# Patient Record
Sex: Female | Born: 1940
Health system: Southern US, Community
[De-identification: ages and names within clinical notes are randomized; demographics above are authoritative.]

## PROBLEM LIST (undated history)

## (undated) DIAGNOSIS — K219 Gastro-esophageal reflux disease without esophagitis: Secondary | ICD-10-CM

## (undated) DIAGNOSIS — IMO0001 Reserved for inherently not codable concepts without codable children: Secondary | ICD-10-CM

## (undated) DIAGNOSIS — T7840XA Allergy, unspecified, initial encounter: Secondary | ICD-10-CM

## (undated) DIAGNOSIS — E0500 Thyrotoxicosis with diffuse goiter without thyrotoxic crisis or storm: Secondary | ICD-10-CM

## (undated) DIAGNOSIS — C801 Malignant (primary) neoplasm, unspecified: Secondary | ICD-10-CM

## (undated) DIAGNOSIS — M1990 Unspecified osteoarthritis, unspecified site: Secondary | ICD-10-CM

## (undated) HISTORY — DX: Allergy, unspecified, initial encounter: T78.40XA

## (undated) HISTORY — PX: ABDOMINAL HYSTERECTOMY: SHX81

## (undated) HISTORY — DX: Malignant (primary) neoplasm, unspecified: C80.1

## (undated) HISTORY — PX: SALIVARY GLAND SURGERY: SHX768

## (undated) HISTORY — DX: Unspecified osteoarthritis, unspecified site: M19.90

---

## 1998-05-10 ENCOUNTER — Other Ambulatory Visit: Admission: RE | Admit: 1998-05-10 | Discharge: 1998-05-10 | Payer: Self-pay | Admitting: Obstetrics & Gynecology

## 1999-04-22 ENCOUNTER — Ambulatory Visit (HOSPITAL_COMMUNITY): Admission: RE | Admit: 1999-04-22 | Discharge: 1999-04-22 | Payer: Self-pay | Admitting: Internal Medicine

## 1999-04-22 ENCOUNTER — Encounter: Payer: Self-pay | Admitting: Internal Medicine

## 1999-05-06 ENCOUNTER — Encounter: Payer: Self-pay | Admitting: Internal Medicine

## 1999-05-06 ENCOUNTER — Ambulatory Visit (HOSPITAL_COMMUNITY): Admission: RE | Admit: 1999-05-06 | Discharge: 1999-05-06 | Payer: Self-pay | Admitting: Internal Medicine

## 1999-05-21 ENCOUNTER — Other Ambulatory Visit: Admission: RE | Admit: 1999-05-21 | Discharge: 1999-05-21 | Payer: Self-pay | Admitting: Obstetrics & Gynecology

## 1999-08-06 ENCOUNTER — Ambulatory Visit (HOSPITAL_COMMUNITY): Admission: RE | Admit: 1999-08-06 | Discharge: 1999-08-06 | Payer: Self-pay | Admitting: *Deleted

## 2000-06-18 ENCOUNTER — Emergency Department (HOSPITAL_COMMUNITY): Admission: EM | Admit: 2000-06-18 | Discharge: 2000-06-18 | Payer: Self-pay | Admitting: Emergency Medicine

## 2000-08-14 ENCOUNTER — Other Ambulatory Visit: Admission: RE | Admit: 2000-08-14 | Discharge: 2000-08-14 | Payer: Self-pay | Admitting: Obstetrics & Gynecology

## 2001-07-21 ENCOUNTER — Encounter: Payer: Self-pay | Admitting: Family Medicine

## 2001-07-21 ENCOUNTER — Ambulatory Visit (HOSPITAL_COMMUNITY): Admission: RE | Admit: 2001-07-21 | Discharge: 2001-07-21 | Payer: Self-pay | Admitting: Family Medicine

## 2001-07-29 ENCOUNTER — Ambulatory Visit (HOSPITAL_COMMUNITY): Admission: RE | Admit: 2001-07-29 | Discharge: 2001-07-29 | Payer: Self-pay | Admitting: Family Medicine

## 2001-07-29 ENCOUNTER — Encounter: Payer: Self-pay | Admitting: Family Medicine

## 2001-09-01 ENCOUNTER — Other Ambulatory Visit: Admission: RE | Admit: 2001-09-01 | Discharge: 2001-09-01 | Payer: Self-pay | Admitting: Obstetrics & Gynecology

## 2002-04-01 ENCOUNTER — Encounter (INDEPENDENT_AMBULATORY_CARE_PROVIDER_SITE_OTHER): Payer: Self-pay | Admitting: *Deleted

## 2002-04-01 ENCOUNTER — Ambulatory Visit (HOSPITAL_COMMUNITY): Admission: RE | Admit: 2002-04-01 | Discharge: 2002-04-01 | Payer: Self-pay | Admitting: Obstetrics & Gynecology

## 2002-10-03 ENCOUNTER — Other Ambulatory Visit: Admission: RE | Admit: 2002-10-03 | Discharge: 2002-10-03 | Payer: Self-pay | Admitting: Obstetrics and Gynecology

## 2004-01-23 ENCOUNTER — Other Ambulatory Visit: Admission: RE | Admit: 2004-01-23 | Discharge: 2004-01-23 | Payer: Self-pay | Admitting: Obstetrics & Gynecology

## 2004-03-18 ENCOUNTER — Ambulatory Visit (HOSPITAL_COMMUNITY): Admission: RE | Admit: 2004-03-18 | Discharge: 2004-03-18 | Payer: Self-pay | Admitting: *Deleted

## 2004-06-20 ENCOUNTER — Ambulatory Visit (HOSPITAL_COMMUNITY): Admission: RE | Admit: 2004-06-20 | Discharge: 2004-06-20 | Payer: Self-pay | Admitting: *Deleted

## 2004-06-20 ENCOUNTER — Encounter (INDEPENDENT_AMBULATORY_CARE_PROVIDER_SITE_OTHER): Payer: Self-pay | Admitting: Specialist

## 2004-10-06 ENCOUNTER — Emergency Department (HOSPITAL_COMMUNITY): Admission: EM | Admit: 2004-10-06 | Discharge: 2004-10-06 | Payer: Self-pay | Admitting: Emergency Medicine

## 2005-07-30 ENCOUNTER — Other Ambulatory Visit: Admission: RE | Admit: 2005-07-30 | Discharge: 2005-07-30 | Payer: Self-pay | Admitting: Obstetrics & Gynecology

## 2005-08-18 ENCOUNTER — Ambulatory Visit (HOSPITAL_COMMUNITY): Admission: RE | Admit: 2005-08-18 | Discharge: 2005-08-18 | Payer: Self-pay | Admitting: *Deleted

## 2005-08-18 ENCOUNTER — Encounter (INDEPENDENT_AMBULATORY_CARE_PROVIDER_SITE_OTHER): Payer: Self-pay | Admitting: *Deleted

## 2006-03-06 ENCOUNTER — Ambulatory Visit (HOSPITAL_COMMUNITY): Admission: RE | Admit: 2006-03-06 | Discharge: 2006-03-06 | Payer: Self-pay | Admitting: Endocrinology

## 2006-03-06 ENCOUNTER — Encounter: Payer: Self-pay | Admitting: Vascular Surgery

## 2007-09-17 ENCOUNTER — Encounter (INDEPENDENT_AMBULATORY_CARE_PROVIDER_SITE_OTHER): Payer: Self-pay | Admitting: *Deleted

## 2007-09-17 ENCOUNTER — Ambulatory Visit (HOSPITAL_COMMUNITY): Admission: RE | Admit: 2007-09-17 | Discharge: 2007-09-17 | Payer: Self-pay | Admitting: *Deleted

## 2007-11-16 ENCOUNTER — Encounter (INDEPENDENT_AMBULATORY_CARE_PROVIDER_SITE_OTHER): Payer: Self-pay | Admitting: Obstetrics & Gynecology

## 2007-11-16 ENCOUNTER — Ambulatory Visit (HOSPITAL_COMMUNITY): Admission: RE | Admit: 2007-11-16 | Discharge: 2007-11-17 | Payer: Self-pay | Admitting: Obstetrics & Gynecology

## 2010-05-29 ENCOUNTER — Encounter: Admission: RE | Admit: 2010-05-29 | Discharge: 2010-05-29 | Payer: Self-pay | Admitting: Endocrinology

## 2010-09-24 ENCOUNTER — Emergency Department (HOSPITAL_COMMUNITY): Admission: EM | Admit: 2010-09-24 | Discharge: 2010-09-24 | Payer: Self-pay | Admitting: Emergency Medicine

## 2011-05-06 NOTE — Discharge Summary (Signed)
NAMEARIANI, [name]               ACCOUNT NO.:  [id number]   MEDICAL RECORD NO.:  [id number]          PATIENT TYPE:  OIB   LOCATION:  9303                          FACILITY:  [facility]   PHYSICIAN:  Freddy Finner, M.D.   DATE OF BIRTH:  [birth year]   DATE OF ADMISSION:  11/16/2007  DATE OF DISCHARGE:  11/17/2007                               DISCHARGE SUMMARY   DISCHARGE DIAGNOSES:  1. Uterine fibroids.  2. Dysfunctional uterine bleeding uncontrolled by hormonal therapy.   OPERATIVE PROCEDURE:  Laparoscopically-assisted vaginal hysterectomy,  bilateral salpingo-oophorectomy.   COMPLICATIONS:  None.   DISPOSITION:  The patient is in satisfactory and improved condition at  the time of her discharge on the first postoperative morning.  She was  ambulating without difficulty, having adequate bowel and bladder  function, tolerating a regular diet.  She is to have progressively  increasing physical activity and no vaginal entry.  She is to return to  the office in 2 weeks for first postoperative visit.  She is to resume  all of her preoperative medications, and she is given Darvocet-N 100 to  be taken 1 p.o. q.4-6 h. __________   Details of present illness, past history, family history, review of  systems, and physical exam are recorded on admission note.  Physical  findings significant for the pelvic exam with ultrasound showing  fibroids.  The patient has continued to have dysfunctional bleeding  unresponsive to hormone manipulation.  She has had previous D&Cs with  incomplete resolution.  She was admitted for definitive surgery.   Laboratory data during this admission includes a normal prothrombin  time, PTT and INR.  Her admission hemoglobin was 13.3.  Postoperative  hemoglobin __________   HOSPITAL COURSE:  The patient was admitted for surgery.  She was treated  perioperatively with IV antibiotic and PAS compression hose.  Her  procedure was accomplished without significant blood  loss or  intraoperative complications.  By the morning of the first postoperative  day, she was considered to be in good condition.  Her disposition is  noted above.      Freddy Finner, M.D.  Electronically Signed     WRN/MEDQ  D:  11/17/2007  T:  11/17/2007  Job:  [id number]

## 2011-05-06 NOTE — Op Note (Signed)
[name], [name]               ACCOUNT NO.:  [id number]   MEDICAL RECORD NO.:  [id number]          PATIENT TYPE:  OIB   LOCATION:  9303                          FACILITY:  [facility]   PHYSICIAN:  Freddy Finner, M.D.   DATE OF BIRTH:  October 18, 1941   DATE OF PROCEDURE:  11/16/2007  DATE OF DISCHARGE:                               OPERATIVE REPORT   PREOPERATIVE DIAGNOSES:  1. Uterine leiomyomata.  2. Dysfunctional uterine bleeding, uncontrolled by hormonal therapy.   POSTOPERATIVE DIAGNOSES:  1. Uterine leiomyomata.  2. Dysfunctional uterine bleeding, uncontrolled by hormonal therapy.   OPERATIVE PROCEDURE:  Laparoscopically-assisted vaginal hysterectomy  bilateral salpingo-oophorectomy.   SURGEON:  Freddy Finner, M.D.   ASSISTANT:  Guy Sandifer. Henderson Cloud, M.D.   ESTIMATED INTRAOPERATIVE BLOOD LOSS:  100 mL.   INTRAOPERATIVE COMPLICATIONS:  None.   The patient is a 70 year old with clinical symptoms of persistent  dysfunctional bleeding which we have not been adequately able to control  with hormonal manipulation.  She has had previous D&C's and is  requesting definitive surgery.  She is known to have fibroids by  ultrasound.   She was admitted on the morning of surgery, given a bolus of Ancef,  placed in PAS hose, brought to the operating room and placed there  placed under adequate general endotracheal anesthesia, then placed in  the dorsal lithotomy position using the Allen stirrup system.  Betadine  prep of the abdomen, perineum and vagina was carried out with Betadine  scrub, followed by Betadine solution.  Bladder was evacuated using  sterile technique.  Hulka tenaculum was attached to the cervix under  direct visualization.  Sterile drapes were applied.  Two small incisions  were made through old scars, and through the upper incision at the  umbilicus an 11-mm bladed disposable trocar was introduced while  elevating the anterior abdominal wall manually.  Direct inspection  revealed adequate placement and no evidence of injury on entry.  Pneumoperitoneum was allowed to accumulate with the carbon dioxide gas.  A 5-mm trocar was placed through her lower incision just above the  symphysis.  Through it the spring-loaded grasping forceps were used.  Systematic examination of abdominal and pelvic contents was carried out.  The uterus was slightly enlarged.  There were no apparent abnormalities  in the abdomen.  The appendix was not visualized.  Tubes and ovaries  were appropriate for postmenopausal state and normal.  The spring-loaded  graspers were used to grasp the left adnexa.  The Gyrus tripolar device  was used through the operating channel of the laparoscope and the  infundibulopelvic ligament, round ligament, upper broad ligament were  progressively sealed and divided using the tripolar device.  The  dissection was carried down to a level just above the uterine artery.  The right side was treated essentially identically.  Hemostasis was  adequate.  Attention was turned the vaginal portion of the procedure.  A  posterior weighted vaginal retractor was placed, a Jacobs tenaculum was  used to replace the Hulka.  A colpotomy incision was made while tenting  the mucosa posterior to the cervix.  The cervix was circumscribed with a  scalpel to release the mucosa and the bladder advanced off the cervix.  The LigaSure device was then used to seal and divide the uterosacral  ligaments and bladder pillars.  The bladder was further advanced off the  cervix and anterior peritoneum entered.  Cardinal ligament pedicles were  taken, sealed and divided by the LigaSure.  Vessel pedicles were  similarly sealed and divided with the LigaSure.  A pedicle just above  the vessels was also obtained, sealed and divided.  This allowed  delivery of the uterus and tubes and ovaries through the vaginal  introitus.  Angles of the vagina were anchored to the uterosacrals with  mattress  sutures of 0 Monocryl.  Uterosacrals were plicated and  posterior peritoneum closed with interrupted 0 Monocryl.  Cuff was  closed vertically with figure-of-eights of Monocryl.  A Foley catheter  was placed.  Reinspection was then carried out laparoscopically using  the Nezhat irrigation system.  All pedicles were completely dry under  reduced intra-abdominal pressure and irrigation.  Irrigating solution  was aspirated from the abdomen.  Gas was allowed to escape.  Instruments  were removed.  Counts were correct.  The incisions were anesthetized  with 0.25% plain Marcaine and closed with interrupted subcuticular  sutures of 3-0 Dexon, and Steri-Strips were applied to the lower  incision.  The patient was awakened and taken to the recovery room in  good condition.      Freddy Finner, M.D.  Electronically Signed     WRN/MEDQ  D:  11/16/2007  T:  11/16/2007  Job:  [id number]

## 2011-05-06 NOTE — Op Note (Signed)
NAMENIMA, BAMBURG               ACCOUNT NO.:  [id number]   MEDICAL RECORD NO.:  [id number]          PATIENT TYPE:  AMB   LOCATION:  ENDO                         FACILITY:  [facility]   PHYSICIAN:  Georgiana Spinner, M.D.    DATE OF BIRTH:  1941-07-06   DATE OF PROCEDURE:  DATE OF DISCHARGE:                               OPERATIVE REPORT   PROCEDURE:  Upper endoscopy.   INDICATIONS:  GERD.   ANESTHESIA:  Fentanyl 75 mcg, Versed 7 mg.   DESCRIPTION OF PROCEDURE:  With the patient mildly sedated in the left  lateral decubitus position, the Pentax videoscopic endoscope was  inserted in the mouth, passed under direct vision through the esophagus  which appeared normal until we reached the distal esophagus and there  was a question of a tongue of Barrett's esophagus photographed and  biopsied.  We entered into the stomach. The fundus, body, antrum,  duodenal bulb, and second portion of duodenum appeared normal.  From  this point, the endoscope was slowly withdrawn taking circumferential  views of the duodenal mucosa until the endoscope had been pulled back  into the stomach, placed in retroflexion to view the stomach from below.  The endoscope was then straightened and withdrawn taking circumferential  views of the remaining gastric and esophageal mucosa.  The patient's  vital signs and pulse oximeter remained stable.  The patient tolerated  the procedure well without apparent complications.   FINDINGS:  Question of tongue of Barrett's esophagus, but on retroflexed  view it appears that this was probably normal and not related to  Barrett's, but will await biopsy report.  The patient will call me for  results and follow-up with me as an outpatient.           ______________________________  Georgiana Spinner, M.D.     GMO/MEDQ  D:  09/17/2007  T:  09/17/2007  Job:  [id number]

## 2011-05-06 NOTE — H&P (Signed)
[name], [name]               ACCOUNT NO.:  [id number]   MEDICAL RECORD NO.:  [id number]          PATIENT TYPE:  AMB   LOCATION:  SDC                           FACILITY:  [facility]   PHYSICIAN:  Freddy Finner, M.D.   DATE OF BIRTH:  05/10/1941   DATE OF ADMISSION:  11/16/2007  DATE OF DISCHARGE:                              HISTORY & PHYSICAL   ADMITTING DIAGNOSES:  1. Persistent dysfunctional uterine bleeding.  2. Small intramural leiomyomata.  3. Recent benign endometrium on hysteroscopy D&C in April 2008.   The patient is a 70 year old, white, married female, gravida 2, para 2  who has been on hormone replacement for approximately 17 years.  Various  preparations have been used over time. The current protocol includes  Premarin 0.9 mg a day and Prometrium 200 mg a day.  Over this time, the  patient has had recurring episodes of postmenopausal bleeding and in  2003 had a hysteroscopy D&C with a benign endometrial polyp.  She also  had a laparoscopy with a history of chronic left pelvic pain and at that  time it was felt the uterus was irregularly enlarged most consistent  with adenomyosis and/or fibroids and a uterosacral nerve ablation was  performed. The  D&C at that time produced benign findings with  endometrial polyp.  Again she has continued to have irregular bleeding  which we have been unable to adequately manage with various combinations  of hormone replacement therapy.  She has requested definitive surgical  intervention.  She is now admitted for laparoscopically-assisted vaginal  hysterectomy and bilateral salpingo-oophorectomy.   REVIEW OF SYSTEMS:  Otherwise negative. There are no cardiopulmonary, GI  or GU complaints.   FAMILY HISTORY:  Noncontributory.   PAST MEDICAL HISTORY:  The patient's only known significant medical  condition involves hypothyroidism after treatment for Graves disease  several years ago.  She is currently on Synthroid 100 mcg a day. She  also has esophageal reflux for which she takes Protonix daily.  She is  on Premarin and Prometrium as described above for hormone replacement  therapy. She takes vitamins and calcium daily.  She has no known  allergies except to [facility]. She also has nausea with VICODIN. She has  never had a blood transfusion.  She is a long time cigarette smoker but  has stopped within the last 3-5 years. She uses alcohol only socially.   FAMILY HISTORY:  Noncontributory.   PHYSICAL EXAMINATION:  HEENT:  Grossly within normal limits.  Thyroid  gland is not palpably enlarged to my exam.  VITAL SIGNS:  Her blood pressure in the office 154/82, weight 181,  height 5 feet 2-3/4 inches.  CHEST:  Clear to auscultation.  HEART:  Normal sinus rhythm without murmurs, rubs or gallops.  BREAST:  Exam is considered to be normal with no palpable masses, no  nipple discharge, no skin change.  ABDOMEN:  Soft and nontender. There is no hepatosplenomegaly. There is  no CVA tenderness.  EXTREMITIES:  Without cyanosis, clubbing or edema.  PELVIC:  External genitalia, vagina and cervix were normal to  inspection.  There is a small cystocele and rectocele which at the  present time are asymptomatic.  Bimanual reveals the uterus to be  perhaps slightly enlarged but it is difficult clinically to appreciate  significant changes because of body habitus. There are no palpable  adnexal masses.  RECTUM:  Palpably normal.  Rectovaginal exam confirms the above  findings.   ASSESSMENT:  Uterine leiomyomata recurrent, symptomatically troublesome  uterine bleeding.   PLAN:  Laparoscopically assisted vaginal hysterectomy, bilateral  salpingo-oophorectomy.  The patient has been counseled thoroughly  regarding the potential risk of the procedure including infection,  injury to other organs, and hemorrhage.  The risk of deep vein  thrombosis and other postoperative complications including pulmonary  issues has been discussed with  the patient.  She has been counseled on  prophylactic measures which will be taken to reduce the risk of  complications and she is prepared to proceed with surgery.      Freddy Finner, M.D.  Electronically Signed     WRN/MEDQ  D:  11/15/2007  T:  11/16/2007  Job:  [id number]

## 2011-05-09 NOTE — Op Note (Signed)
[name], [name]               ACCOUNT NO.:  [id number]   MEDICAL RECORD NO.:  [id number]          PATIENT TYPE:  AMB   LOCATION:  ENDO                         FACILITY:  [facility]   PHYSICIAN:  Georgiana Spinner, M.D.    DATE OF BIRTH:  30-Mar-1941   DATE OF PROCEDURE:  08/18/2005  DATE OF DISCHARGE:                                 OPERATIVE REPORT   PROCEDURE:  Upper endoscopy.   INDICATIONS:  Gastroesophageal reflux disease.   ANESTHESIA:  Demerol 50, Versed 6 mg.   DESCRIPTION OF PROCEDURE:  With the patient mildly sedated in the left  lateral decubitus position, the Olympus videoscopic endoscope was inserted  in the mouth and passed under direct vision through the esophagus and there  was a questionable area of Barrett's photographed and biopsied. We entered  in the stomach. The fundus, body, antrum, duodenal bulb, and second portion  of duodenum were visualized. From this point, the endoscope was slowly  withdrawn taking circumferential views of the duodenal mucosa until the  endoscope had been pulled back in the stomach, placed in retroflexion to  view the stomach from below. The endoscope was straightened and withdrawn  taking circumferential views of the remaining gastric and esophageal mucosa.  The patient's vital signs and pulse oximeter remained stable. The patient  tolerated the procedure well without apparent complications.   FINDINGS:  Question of Barrett's esophagus, otherwise and unremarkable exam.   PLAN:  Await biopsy report. The patient will call me for results and follow-  up with me as an outpatient           ______________________________  Georgiana Spinner, M.D.     GMO/MEDQ  D:  08/18/2005  T:  08/18/2005  Job:  [id number]

## 2011-05-09 NOTE — Op Note (Signed)
NAME:  [name], [name]                         ACCOUNT NO.:  [id number]   MEDICAL RECORD NO.:  [id number]                   PATIENT TYPE:  AMB   LOCATION:  ENDO                                 FACILITY:  [facility]   PHYSICIAN:  Georgiana Spinner, M.D.                 DATE OF BIRTH:  1941-03-10   DATE OF PROCEDURE:  06/20/2004  DATE OF DISCHARGE:                                 OPERATIVE REPORT   PROCEDURE:  Upper endoscopy with Savary dilation and biopsy.   ENDOSCOPIST:  Georgiana Spinner, M.D.   INDICATIONS FOR PROCEDURE:  Dysphagia.   ANESTHESIA:  Demerol 60 mg, Versed 7 mg.   DESCRIPTION OF PROCEDURE:  With the patient mildly sedated in the recumbent  position, the Olympus videoscopic endoscope was inserted into the mouth and  passed under direct vision into the esophagus, which appeared normal until  we reached the distal esophagus, and then it changed to Barrett's,  photographed.  We entered into the stomach.  The fundus, body, antrum,  duodenal bulb and the second portion of the duodenum appeared normal.  From  this point the endoscope was slowly withdrawn, taking circumferential views  of the duodenal mucosa.  The endoscope was then pulled back into the stomach  and placed on retroflexion to view the stomach from below.  The endoscope  was then straightened.  A guide wire was passed under fluoroscopic control.  The endoscope was removed and subsequently Savary dilators #15, #17 and #18  were passed rather easily, again with fluoroscopic control with the C-arm in  room #2 of endoscopy.  Subsequently once this was completed, the guide wire  was removed.  The endoscope was re-inserted.  The area of Barrett's was  photographed once again and biopsied.  The endoscope was withdrawn.  The patient's vital signs and pulse oximeter remained stable.  The patient  tolerated the procedure well with no apparent complications.   FINDINGS:  Island of Barrett's esophagus, biopsied.  Dilation of  esophagus  with #15, #17 and #18 dilators.   PLAN:  Await clinical response.  The patient will call me for the results of  the biopsy and follow up with me as an outpatient.                                               Georgiana Spinner, M.D.    GMO/MEDQ  D:  06/20/2004  T:  06/20/2004  Job:  [id number]

## 2011-09-30 LAB — CBC
HCT: 31.7 — ABNORMAL LOW
MCV: 96.5
Platelets: 296
RBC: 3.28 — ABNORMAL LOW
RBC: 4.01
WBC: 11.8 — ABNORMAL HIGH
WBC: 9.1

## 2011-09-30 LAB — APTT: aPTT: 23 — ABNORMAL LOW

## 2011-09-30 LAB — PROTIME-INR
INR: 0.9
Prothrombin Time: 12.5

## 2012-01-09 ENCOUNTER — Other Ambulatory Visit: Payer: Self-pay

## 2012-01-09 ENCOUNTER — Emergency Department (HOSPITAL_COMMUNITY)
Admission: EM | Admit: 2012-01-09 | Discharge: 2012-01-09 | Disposition: A | Discharge status: Home / Self Care | Payer: Medicare Other | Attending: Emergency Medicine | Admitting: Emergency Medicine

## 2012-01-09 ENCOUNTER — Encounter (HOSPITAL_COMMUNITY): Payer: Self-pay | Admitting: Emergency Medicine

## 2012-01-09 DIAGNOSIS — R000 Tachycardia, unspecified: Secondary | ICD-10-CM | POA: Insufficient documentation

## 2012-01-09 DIAGNOSIS — R21 Rash and other nonspecific skin eruption: Secondary | ICD-10-CM | POA: Insufficient documentation

## 2012-01-09 DIAGNOSIS — T7840XA Allergy, unspecified, initial encounter: Secondary | ICD-10-CM | POA: Insufficient documentation

## 2012-01-09 DIAGNOSIS — X58XXXA Exposure to other specified factors, initial encounter: Secondary | ICD-10-CM | POA: Insufficient documentation

## 2012-01-09 HISTORY — DX: Gastro-esophageal reflux disease without esophagitis: K21.9

## 2012-01-09 HISTORY — DX: Reserved for inherently not codable concepts without codable children: IMO0001

## 2012-01-09 HISTORY — DX: Thyrotoxicosis with diffuse goiter without thyrotoxic crisis or storm: E05.00

## 2012-01-09 MED ORDER — PREDNISONE 50 MG PO TABS: ORAL_TABLET | ORAL | Status: AC

## 2012-01-09 MED ORDER — FAMOTIDINE IN NACL 20-0.9 MG/50ML-% IV SOLN
20.0000 mg | Freq: Once | INTRAVENOUS | Status: AC
  Administered 2012-01-09: 20 mg via INTRAVENOUS
  Filled 2012-01-09: qty 50

## 2012-01-09 MED ORDER — METHYLPREDNISOLONE SODIUM SUCC 125 MG IJ SOLR
125.0000 mg | Freq: Once | INTRAMUSCULAR | Status: AC
  Administered 2012-01-09: 125 mg via INTRAVENOUS
  Filled 2012-01-09: qty 2

## 2012-01-09 MED ORDER — FAMOTIDINE 20 MG PO TABS: 20.0000 mg | ORAL_TABLET | Freq: Two times a day (BID) | ORAL | Status: DC

## 2012-01-09 MED ORDER — DIPHENHYDRAMINE HCL 50 MG/ML IJ SOLN
25.0000 mg | Freq: Once | INTRAMUSCULAR | Status: AC
  Administered 2012-01-09: 25 mg via INTRAVENOUS
  Filled 2012-01-09: qty 1

## 2012-01-09 NOTE — ED Notes (Signed)
Pt alert, c/o ? Allergic rxn to Zithromax, pt recently stopped taking yesterday after noticing a skin reaction yesterday, presents today with Hives, sob, "feeling of heart racing", resp even unlabored, pt states took 50mg  Benadryl last pm

## 2012-01-09 NOTE — ED Notes (Signed)
Pt remains alert, states "i feel like my throat is tight", resp remain even unlabored, no stridor noted, ALP and MD bedside

## 2012-01-09 NOTE — ED Provider Notes (Signed)
History     CSN: [id number]  Arrival date & time 01/09/12  4098   First MD Initiated Contact with Patient 01/09/12 [phone]      Chief Complaint  Patient presents with  . Allergic Reaction    (Consider location/radiation/quality/duration/timing/severity/associated sxs/prior treatment) HPI Comments: Patient reports that she first noticed a rash on both of her legs yesterday.  She called her PCP at that time and was recommended to take two benadryl.  She took the Benadryl and reports that the rash improved after the Benadryl.  She then woke up this morning at 4 AM and noticed that she had a rash on her back, abdomen, chest, both arms, both legs, and face.  She did not take any Benadryl this morning.  She reports that the rash itches.  She denies any difficulty breathing, but feels that her heart is racing.  She reports that she was recently started on Erythromycin by her PCP.  She reports that she was on this medication for nasal congestion, earache, and cough.  She stated that she has never taken this medication before.  She also reports that she also recently changed laundry detergents.    Patient is a 71 y.o. female presenting with allergic reaction. The history is provided by the patient.  Allergic Reaction The primary symptoms are  rash. The primary symptoms do not include wheezing, shortness of breath, cough, abdominal pain, nausea, vomiting, dizziness, altered mental status or angioedema.  The rash is associated with itching.  Significant symptoms also include itching.    Past Medical History  Diagnosis Date  . Grave's disease   . Reflux     Past Surgical History  Procedure Date  . Abdominal hysterectomy     No family history on file.  History  Substance Use Topics  . Smoking status: Not on file  . Smokeless tobacco: Not on file  . Alcohol Use:     OB History    Grav Para Term Preterm Abortions TAB SAB Ect Mult Living                  Review of Systems    Constitutional: Negative for fever and chills.  HENT: Negative for facial swelling and voice change.   Eyes: Negative for visual disturbance.  Respiratory: Negative for cough, chest tightness, shortness of breath and wheezing.   Gastrointestinal: Negative for nausea, vomiting and abdominal pain.  Skin: Positive for itching and rash.  Neurological: Negative for dizziness.  Psychiatric/Behavioral: Negative for altered mental status.    Allergies  Bactrim; Erythromycin; Macrobid; and Vicodin  Home Medications   Current Outpatient Rx  Name Route Sig Dispense Refill  . ERYTHROMYCIN BASE 500 MG PO TABS Oral Take 500 mg by mouth 3 (three) times daily. Patient stopped because it gave her a rash    . ESTROGENS CONJUGATED 0.9 MG PO TABS Oral Take 0.9 mg by mouth daily. Take daily for 21 days then do not take for 7 days.    Marland Kitchen FLUTICASONE PROPIONATE 50 MCG/ACT NA SUSP Nasal Place 2 sprays into the nose daily.    Marland Kitchen LEVOTHYROXINE SODIUM 100 MCG PO TABS Oral Take 100 mcg by mouth daily.    Marland Kitchen FAMOTIDINE 20 MG PO TABS Oral Take 1 tablet (20 mg total) by mouth 2 (two) times daily. 10 tablet 0  . PREDNISONE 50 MG PO TABS  Take one tablet by mouth once daily for five days 5 tablet 0    BP 143/58  Pulse 80  Temp(Src) 98.2 F (36.8 C) (Oral)  Resp 19  Wt 180 lb (81.647 kg)  SpO2 99%  Physical Exam  Nursing note and vitals reviewed. Constitutional: She is oriented to person, place, and time. She appears well-developed and well-nourished. No distress.  HENT:  Head: Normocephalic and atraumatic.  Nose: Nose normal.  Mouth/Throat: Uvula is midline, oropharynx is clear and moist and mucous membranes are normal. No uvula swelling. No posterior oropharyngeal edema.       Airway patent.  No swelling of lips, tongue, uvula, or airway.  Neck: Normal range of motion. Neck supple.  Cardiovascular: Normal rate and normal heart sounds.   Pulmonary/Chest: Effort normal. No stridor. No respiratory distress.  She has no wheezes.  Neurological: She is alert and oriented to person, place, and time.  Skin: Skin is warm and dry. Rash noted. She is not diaphoretic.       Diffuse erythematous papular rash located on the face, trunk, both legs, and both arms.  Psychiatric: She has a normal mood and affect.    ED Course  Procedures (including critical care time)  Labs Reviewed - No data to display No results found.   1. Allergic reaction      Date: 01/09/2012  Rate: 81  Rhythm: normal sinus rhythm  QRS Axis: normal  Intervals: normal  ST/T Wave abnormalities: normal  Conduction Disutrbances:none  Narrative Interpretation:   Old EKG Reviewed: unchanged  8:44 AM Reassessed patient.  Patient is in no acute distress.  Lungs CTAB.  No angioedema. Will given Benadryl and Pepcid a little more time to work and reassess. 8:44 AM Reassessed patient.  Patient reports that she is feeling better.  Patient able to tolerate po liquids and did not have any difficulty swallowing.  MDM  Patient appears to have had an allergic reaction.  She does not have any angioedema.  No dyspnea or respiratory distress.  Pulse ox 99 on RA.  Patient was given Solumedrol, Pepcid, and Benadryl in the ED which she reports her symptoms improved somewhat.  Feel that patient can be discharged home with prescriptions for Prednisone and Pepcid.  Patient in agreement with the plan.        Pascal Lux Edison, PA-C 01/09/12 [phone]

## 2012-01-10 NOTE — ED Provider Notes (Signed)
Medical screening examination/treatment/procedure(s) were performed by non-physician practitioner and as supervising physician I was immediately available for consultation/collaboration.  Jasmine Awe, MD 01/10/12 [phone]

## 2012-09-16 ENCOUNTER — Other Ambulatory Visit: Payer: Self-pay | Admitting: Obstetrics & Gynecology

## 2012-09-16 DIAGNOSIS — R928 Other abnormal and inconclusive findings on diagnostic imaging of breast: Secondary | ICD-10-CM

## 2012-09-20 ENCOUNTER — Ambulatory Visit
Admission: RE | Admit: 2012-09-20 | Discharge: 2012-09-20 | Disposition: A | Discharge status: Home / Self Care | Payer: Medicare Other | Source: Ambulatory Visit | Attending: Obstetrics & Gynecology | Admitting: Obstetrics & Gynecology

## 2012-09-20 DIAGNOSIS — R928 Other abnormal and inconclusive findings on diagnostic imaging of breast: Secondary | ICD-10-CM

## 2013-08-24 ENCOUNTER — Other Ambulatory Visit: Payer: Self-pay | Admitting: Obstetrics & Gynecology

## 2013-08-24 DIAGNOSIS — R921 Mammographic calcification found on diagnostic imaging of breast: Secondary | ICD-10-CM

## 2013-09-14 ENCOUNTER — Ambulatory Visit
Admission: RE | Admit: 2013-09-14 | Discharge: 2013-09-14 | Disposition: A | Discharge status: Home / Self Care | Payer: Medicare Other | Source: Ambulatory Visit | Attending: Obstetrics & Gynecology | Admitting: Obstetrics & Gynecology

## 2013-09-14 ENCOUNTER — Other Ambulatory Visit: Payer: Self-pay | Admitting: Obstetrics & Gynecology

## 2013-09-14 DIAGNOSIS — N6310 Unspecified lump in the right breast, unspecified quadrant: Secondary | ICD-10-CM

## 2013-09-14 DIAGNOSIS — R921 Mammographic calcification found on diagnostic imaging of breast: Secondary | ICD-10-CM

## 2014-04-05 ENCOUNTER — Other Ambulatory Visit: Payer: Self-pay | Admitting: Endocrinology

## 2014-04-05 DIAGNOSIS — R609 Edema, unspecified: Secondary | ICD-10-CM

## 2014-04-06 ENCOUNTER — Ambulatory Visit
Admission: RE | Admit: 2014-04-06 | Discharge: 2014-04-06 | Disposition: A | Discharge status: Home / Self Care | Payer: Medicare Other | Source: Ambulatory Visit | Attending: Endocrinology | Admitting: Endocrinology

## 2014-04-06 DIAGNOSIS — R609 Edema, unspecified: Secondary | ICD-10-CM

## 2014-04-12 ENCOUNTER — Other Ambulatory Visit: Payer: Self-pay | Admitting: Dermatology

## 2014-09-01 ENCOUNTER — Other Ambulatory Visit: Payer: Self-pay | Admitting: Obstetrics & Gynecology

## 2014-09-01 DIAGNOSIS — N6310 Unspecified lump in the right breast, unspecified quadrant: Secondary | ICD-10-CM

## 2014-09-18 ENCOUNTER — Other Ambulatory Visit: Payer: Self-pay | Admitting: Obstetrics & Gynecology

## 2014-09-18 ENCOUNTER — Encounter (INDEPENDENT_AMBULATORY_CARE_PROVIDER_SITE_OTHER): Payer: Self-pay

## 2014-09-18 ENCOUNTER — Ambulatory Visit
Admission: RE | Admit: 2014-09-18 | Discharge: 2014-09-18 | Disposition: A | Discharge status: Home / Self Care | Payer: Medicare Other | Source: Ambulatory Visit | Attending: Obstetrics & Gynecology | Admitting: Obstetrics & Gynecology

## 2014-09-18 DIAGNOSIS — N6310 Unspecified lump in the right breast, unspecified quadrant: Secondary | ICD-10-CM

## 2014-09-25 ENCOUNTER — Other Ambulatory Visit: Payer: Self-pay | Admitting: Obstetrics & Gynecology

## 2014-09-26 LAB — CYTOLOGY - PAP

## 2015-03-07 ENCOUNTER — Other Ambulatory Visit: Payer: Self-pay | Admitting: *Deleted

## 2015-03-07 DIAGNOSIS — I8390 Asymptomatic varicose veins of unspecified lower extremity: Secondary | ICD-10-CM

## 2015-04-13 ENCOUNTER — Encounter: Payer: Self-pay | Admitting: Vascular Surgery

## 2015-04-13 ENCOUNTER — Encounter (HOSPITAL_COMMUNITY): Payer: Self-pay

## 2015-04-16 ENCOUNTER — Ambulatory Visit (INDEPENDENT_AMBULATORY_CARE_PROVIDER_SITE_OTHER): Payer: Medicare Other | Admitting: Vascular Surgery

## 2015-04-16 ENCOUNTER — Encounter: Payer: Self-pay | Admitting: Vascular Surgery

## 2015-04-16 ENCOUNTER — Ambulatory Visit (HOSPITAL_COMMUNITY)
Admission: RE | Admit: 2015-04-16 | Discharge: 2015-04-16 | Disposition: A | Discharge status: Home / Self Care | Payer: Medicare Other | Source: Ambulatory Visit | Attending: Vascular Surgery | Admitting: Vascular Surgery

## 2015-04-16 VITALS — BP 162/82 | HR 62 | Temp 97.6°F | Resp 16 | Ht 63.0 in | Wt 177.0 lb

## 2015-04-16 DIAGNOSIS — I8390 Asymptomatic varicose veins of unspecified lower extremity: Secondary | ICD-10-CM | POA: Insufficient documentation

## 2015-04-16 DIAGNOSIS — I83893 Varicose veins of bilateral lower extremities with other complications: Secondary | ICD-10-CM | POA: Diagnosis not present

## 2015-04-16 DIAGNOSIS — I872 Venous insufficiency (chronic) (peripheral): Secondary | ICD-10-CM

## 2015-04-16 DIAGNOSIS — IMO0002 Reserved for concepts with insufficient information to code with codable children: Secondary | ICD-10-CM | POA: Insufficient documentation

## 2015-04-16 DIAGNOSIS — I868 Varicose veins of other specified sites: Secondary | ICD-10-CM | POA: Diagnosis not present

## 2015-04-16 NOTE — Progress Notes (Signed)
Reason for referral:Vericose veins  History of Present Illness  [name] is a 74 y.o. female who presents with chief complaint: swollen ankles and multiple spider veins with one episode of bleeding right medial thigh.    Patient notes, onset greater than 16  months ago, associated with itching and numbness of the feet.  The patient has had no history of DVT, positive history of varicose vein, no history of venous stasis ulcers, no history of  Lymphedema and no history of skin changes in lower legs.  There is positive family history of venous disorders.  The patient has  Used knee compression stockings in the past for traveling.  Her past medical history includes Graves disease and GERD.  She denise DM, HTN and hypercholesterolemia.  Past Medical History  Diagnosis Date  . Grave's disease   . Reflux     Past Surgical History  Procedure Laterality Date  . Abdominal hysterectomy      History   Social History  . Marital Status: Married    Spouse Name: N/A  . Number of Children: N/A  . Years of Education: N/A   Occupational History  . Not on file.   Social History Main Topics  . Smoking status: Not on file  . Smokeless tobacco: Not on file  . Alcohol Use: Not on file  . Drug Use: Not on file  . Sexual Activity: Not on file   Other Topics Concern  . Not on file   Social History Narrative    Family History: patient cannot detail her parents' medical problems  Current Outpatient Prescriptions on File Prior to Visit  Medication Sig Dispense Refill  . levothyroxine (SYNTHROID, LEVOTHROID) 100 MCG tablet Take 100 mcg by mouth daily.    Marland Kitchen erythromycin base (E-MYCIN) 500 MG tablet Take 500 mg by mouth 3 (three) times daily. Patient stopped because it gave her a rash    . estrogens, conjugated, (PREMARIN) 0.9 MG tablet Take 0.9 mg by mouth daily. Take daily for 21 days then do not take for 7 days.    . famotidine (PEPCID) 20 MG tablet Take 1 tablet (20 mg total) by  mouth 2 (two) times daily. 10 tablet 0  . fluticasone (FLONASE) 50 MCG/ACT nasal spray Place 2 sprays into the nose daily.     No current facility-administered medications on file prior to visit.    Allergies as of 04/16/2015 - Review Complete 04/16/2015  Allergen Reaction Noted  . Bactrim  01/09/2012  . Erythromycin  01/09/2012  . Nitrofurantoin monohyd macro  01/09/2012  . Vicodin [hydrocodone-acetaminophen]  01/09/2012     ROS:   General:  No weight loss, Fever, chills  HEENT: No recent headaches, no nasal bleeding, no visual changes, no sore throat  Neurologic: No dizziness, blackouts, seizures. No recent symptoms of stroke or mini- stroke. No recent episodes of slurred speech, or temporary blindness.  Cardiac: No recent episodes of chest pain/pressure, no shortness of breath at rest.  No shortness of breath with exertion.  Denies history of atrial fibrillation or irregular heartbeat  Vascular: No history of rest pain in feet.  No history of claudication.  No history of non-healing ulcer, No history of DVT spider veins  Pulmonary: No home oxygen, no productive cough, no hemoptysis,  No asthma or wheezing  Musculoskeletal:  [x ] Arthritis, [ ]  Low back pain,  [ ]  Joint pain  Hematologic:No history of hypercoagulable state.  No history of easy bleeding.  No history of anemia  Gastrointestinal: No hematochezia or melena,  positive gastroesophageal reflux, no trouble swallowing  Urinary: [ ]  chronic Kidney disease, [ ]  on HD - [ ]  MWF or [ ]  TTHS, [ ]  Burning with urination, [ ]  Frequent urination, [ ]  Difficulty urinating;   Skin: No rashes  Psychological: No history of anxiety,  No history of depression   Physical Examination  Filed Vitals:   04/16/15 1329  BP: 162/82  Pulse: 62  Temp: 97.6 F (36.4 C)  TempSrc: Oral  Resp: 16  Height: 5\' 3"  (1.6 m)  Weight: 177 lb (80.287 kg)  SpO2: 98%    Body mass index is 31.36 kg/(m^2).  General:  Alert and oriented,  no acute distress HEENT: Normal Neck: No bruit or JVD Pulmonary: Clear to auscultation bilaterally Cardiac: Regular Rate and Rhythm without murmur Abdomen: Soft, non-tender, non-distended, no mass, no scars Skin: No rash, positive multiple bilateral areas of spider veins. No ulcers, or dark discoloration. Extremity Pulses:  2+ radial, brachial, femoral, dorsalis pedis, posterior tibial pulses bilaterally Musculoskeletal: No deformity or edema  Neurologic: Upper and lower extremity motor 5/5 and symmetric Psychiatric: Judgment intact, Mood & affect appropriate for pt's clinical situation Lymph : No Cervical, Axillary, or Inguinal lymphadenopathy    DATA: Venous reflux Left proximal calf diameter 0.39Deep vein CFV Right SFJ  0.64 and deep reflux CFV No DVT  Assessment: Venous hypertension Multiple spider veins  Plan: She was given a prescription for thigh high compression stockings.  She agree to be compliant with a minimum of 3 months of compression. She will follow up in 6 months after she has taken care of family affairs and se Dr. Donnetta Hutching or Dr. Kellie Simmering for consultation possible sclerotherapy verses stab phlebectomy.  [name], [name] [facility] PA-C [facility] [place] Office: [phone]  She was seen today in conjunction with Dr. Bridgett Larsson  Addendum  I have independently interviewed and examined the patient, and I agree with the physician assistant's findings.  Patient has varicosities and spider veins.  Her varicose veins have been complicated with bleeding requiring ER evaluation.  I don't think her CVI is extensive enough to benefit from EVLA but I suspect she will need sclerotherapy +/- stab phlebectomy to help reduce the risks of another significant bleeding complication from her varicosities.  Adele Barthel, MD [facility] [place] Office: [phone] Pager: [phone]  04/16/2015, 2:51 PM

## 2015-04-18 ENCOUNTER — Encounter: Payer: Self-pay | Admitting: Endocrinology

## 2015-10-16 ENCOUNTER — Ambulatory Visit: Payer: Medicare Other | Admitting: Vascular Surgery

## 2016-04-19 ENCOUNTER — Encounter (HOSPITAL_COMMUNITY): Payer: Self-pay | Admitting: *Deleted

## 2016-04-19 ENCOUNTER — Emergency Department (HOSPITAL_COMMUNITY)
Admission: EM | Admit: 2016-04-19 | Discharge: 2016-04-19 | Disposition: A | Discharge status: Home / Self Care | Payer: PPO | Attending: Emergency Medicine | Admitting: Emergency Medicine

## 2016-04-19 DIAGNOSIS — E0500 Thyrotoxicosis with diffuse goiter without thyrotoxic crisis or storm: Secondary | ICD-10-CM | POA: Diagnosis not present

## 2016-04-19 DIAGNOSIS — Z79899 Other long term (current) drug therapy: Secondary | ICD-10-CM | POA: Diagnosis not present

## 2016-04-19 DIAGNOSIS — Z8719 Personal history of other diseases of the digestive system: Secondary | ICD-10-CM | POA: Diagnosis not present

## 2016-04-19 DIAGNOSIS — M542 Cervicalgia: Secondary | ICD-10-CM

## 2016-04-19 MED ORDER — TRAMADOL HCL 50 MG PO TABS: 50.0000 mg | ORAL_TABLET | Freq: Four times a day (QID) | ORAL | Status: DC | PRN

## 2016-04-19 MED ORDER — DIAZEPAM 5 MG PO TABS: 5.0000 mg | ORAL_TABLET | Freq: Four times a day (QID) | ORAL | Status: DC | PRN

## 2016-04-19 MED ORDER — PREDNISONE 20 MG PO TABS: 20.0000 mg | ORAL_TABLET | Freq: Two times a day (BID) | ORAL | Status: DC

## 2016-04-19 NOTE — Discharge Instructions (Signed)
Use heat on the sore area 3 or 4 times a day.   Musculoskeletal Pain Musculoskeletal pain is muscle and boney aches and pains. These pains can occur in any part of the body. Your caregiver may treat you without knowing the cause of the pain. They may treat you if blood or urine tests, X-rays, and other tests were normal.  CAUSES There is often not a definite cause or reason for these pains. These pains may be caused by a type of germ (virus). The discomfort may also come from overuse. Overuse includes working out too hard when your body is not fit. Boney aches also come from weather changes. Bone is sensitive to atmospheric pressure changes. HOME CARE INSTRUCTIONS   Ask when your test results will be ready. Make sure you get your test results.  Only take over-the-counter or prescription medicines for pain, discomfort, or fever as directed by your caregiver. If you were given medications for your condition, do not drive, operate machinery or power tools, or sign legal documents for 24 hours. Do not drink alcohol. Do not take sleeping pills or other medications that may interfere with treatment.  Continue all activities unless the activities cause more pain. When the pain lessens, slowly resume normal activities. Gradually increase the intensity and duration of the activities or exercise.  During periods of severe pain, bed rest may be helpful. Lay or sit in any position that is comfortable.  Putting ice on the injured area.  Put ice in a bag.  Place a towel between your skin and the bag.  Leave the ice on for 15 to 20 minutes, 3 to 4 times a day.  Follow up with your caregiver for continued problems and no reason can be found for the pain. If the pain becomes worse or does not go away, it may be necessary to repeat tests or do additional testing. Your caregiver may need to look further for a possible cause. SEEK IMMEDIATE MEDICAL CARE IF:  You have pain that is getting worse and is not  relieved by medications.  You develop chest pain that is associated with shortness or breath, sweating, feeling sick to your stomach (nauseous), or throw up (vomit).  Your pain becomes localized to the abdomen.  You develop any new symptoms that seem different or that concern you. MAKE SURE YOU:   Understand these instructions.  Will watch your condition.  Will get help right away if you are not doing well or get worse.   This information is not intended to replace advice given to you by your health care provider. Make sure you discuss any questions you have with your health care provider.   Document Released: 12/08/2005 Document Revised: 03/01/2012 Document Reviewed: 08/12/2013 Elsevier Interactive Patient Education [employer].

## 2016-04-19 NOTE — ED Provider Notes (Signed)
CSN: [id number]     Arrival date & time 04/19/16  2048 History   First MD Initiated Contact with Patient 04/19/16 2251     Chief Complaint  Patient presents with  . Neck Pain     (Consider location/radiation/quality/duration/timing/severity/associated sxs/prior Treatment) HPI   [name] is a 75 y.o. female presents for evaluation of neck pain, for 3 days, starting spontaneously without trauma. Pain is worse with palpation and movement. No fever, chills, nausea, vomiting, weakness or dizziness. She states that she feels her heartbeat, in her neck. No prior similar problems. She has a remote history of a lumbar disc. There are no other no modifying factors.   Past Medical History  Diagnosis Date  . Grave's disease   . Reflux    Past Surgical History  Procedure Laterality Date  . Abdominal hysterectomy     No family history on file. Social History  Substance Use Topics  . Smoking status: Never Smoker   . Smokeless tobacco: None  . Alcohol Use: No   OB History    No data available     Review of Systems  All other systems reviewed and are negative.     Allergies  Bactrim; Erythromycin; Nitrofurantoin monohyd macro; and Vicodin  Home Medications   Prior to Admission medications   Medication Sig Start Date End Date Taking? Authorizing Provider  glucosamine-chondroitin 500-400 MG tablet Take 1 tablet by mouth daily.   Yes Historical Provider, MD  Multiple Vitamin (MULTIVITAMIN WITH MINERALS) TABS tablet Take 1 tablet by mouth daily.   Yes Historical Provider, MD  SYNTHROID 200 MCG tablet Take 100 mcg by mouth daily. 02/18/16  Yes Historical Provider, MD  diazepam (VALIUM) 5 MG tablet Take 1 tablet (5 mg total) by mouth every 6 (six) hours as needed for anxiety (spasms). 04/19/16   Daleen Bo, MD  predniSONE (DELTASONE) 20 MG tablet Take 1 tablet (20 mg total) by mouth 2 (two) times daily. 04/19/16   Daleen Bo, MD  traMADol (ULTRAM) 50 MG tablet Take 1 tablet  (50 mg total) by mouth every 6 (six) hours as needed. 04/19/16   Daleen Bo, MD   BP 183/76 mmHg  Pulse 72  Temp(Src) 98 F (36.7 C) (Oral)  Resp 20  Ht 5\' 3"  (1.6 m)  Wt 177 lb 9.6 oz (80.559 kg)  BMI 31.47 kg/m2  SpO2 98% Physical Exam  Constitutional: She is oriented to person, place, and time. She appears well-developed and well-nourished.  HENT:  Head: Normocephalic and atraumatic.  Right Ear: External ear normal.  Left Ear: External ear normal.  Eyes: Conjunctivae and EOM are normal. Pupils are equal, round, and reactive to light.  Neck: Normal range of motion and phonation normal. Neck supple.  Cardiovascular: Normal rate, regular rhythm and normal heart sounds.   Pulmonary/Chest: Effort normal and breath sounds normal. She exhibits no bony tenderness.  Abdominal: Soft. There is no tenderness.  Musculoskeletal: Normal range of motion.  Neurological: She is alert and oriented to person, place, and time. No cranial nerve deficit or sensory deficit. She exhibits normal muscle tone. Coordination normal.  Skin: Skin is warm, dry and intact.  Psychiatric: She has a normal mood and affect. Her behavior is normal. Judgment and thought content normal.  Nursing note and vitals reviewed.   ED Course  Procedures (including critical care time)  Initial clinical impression-nonspecific neck pain, likely musculoskeletal. No traumatic injury to be concern for fracture. No systemic symptoms. No clinical evidence for spinal myelopathy.  Medications - No data to display  Patient Vitals for the past 24 hrs:  BP Temp Temp src Pulse Resp SpO2 Height Weight  04/19/16 2106 183/76 mmHg 98 F (36.7 C) Oral 72 20 98 % 5\' 3"  (1.6 m) 177 lb 9.6 oz (80.559 kg)    11:24 PM Reevaluation with update and discussion. After initial assessment and treatment, an updated evaluation reveals Patient remains somewhat uncomfortable, but has no further complaints. Findings discussed with the patient and all  questions were answered. Kirk Review Labs Reviewed - No data to display  Imaging Review No results found. I have personally reviewed and evaluated these images and lab results as part of my medical decision-making.   EKG Interpretation None      MDM   Final diagnoses:  Neck pain    Neck pain, suspected due to arthritis with inflammatory muscle process. Mild spasm versus splinting. Doubt fracture, ruptured disc or significant nerve impingement.  Nursing Notes Reviewed/ Care Coordinated Applicable Imaging Reviewed Interpretation of Laboratory Data incorporated into ED treatment  The patient appears reasonably screened and/or stabilized for discharge and I doubt any other medical condition or other [id number] requiring further screening, evaluation, or treatment in the ED at this time prior to discharge.  Plan: Home Medications- Prednisone, Valium, Ultram; Home Treatments- heat,  rest; return here if the recommended treatment, does not improve the symptoms; Recommended follow up- PCP prn     Daleen Bo, MD 04/19/16 2326

## 2016-04-19 NOTE — ED Notes (Signed)
Pt c/o neck pain that began on Thurs; pt states that she did some yard work but doesn't remember any injury to neck; pt states that she is unable to move head from side to side due to pain; pt states that she has been icing the area and using heat packs without improvement; pt states that she can "feel" when she swallows; pt denies difficulty swallowing

## 2016-04-28 DIAGNOSIS — H532 Diplopia: Secondary | ICD-10-CM | POA: Diagnosis not present

## 2016-04-28 DIAGNOSIS — Z961 Presence of intraocular lens: Secondary | ICD-10-CM | POA: Diagnosis not present

## 2016-04-28 DIAGNOSIS — H43813 Vitreous degeneration, bilateral: Secondary | ICD-10-CM | POA: Diagnosis not present

## 2016-07-31 DIAGNOSIS — M6530 Trigger finger, unspecified finger: Secondary | ICD-10-CM | POA: Diagnosis not present

## 2016-07-31 DIAGNOSIS — E032 Hypothyroidism due to medicaments and other exogenous substances: Secondary | ICD-10-CM | POA: Diagnosis not present

## 2016-07-31 DIAGNOSIS — Z0000 Encounter for general adult medical examination without abnormal findings: Secondary | ICD-10-CM | POA: Diagnosis not present

## 2016-07-31 DIAGNOSIS — M170 Bilateral primary osteoarthritis of knee: Secondary | ICD-10-CM | POA: Diagnosis not present

## 2016-07-31 DIAGNOSIS — M150 Primary generalized (osteo)arthritis: Secondary | ICD-10-CM | POA: Diagnosis not present

## 2016-07-31 DIAGNOSIS — E034 Atrophy of thyroid (acquired): Secondary | ICD-10-CM | POA: Diagnosis not present

## 2016-08-01 DIAGNOSIS — M6530 Trigger finger, unspecified finger: Secondary | ICD-10-CM | POA: Diagnosis not present

## 2016-08-01 DIAGNOSIS — M79641 Pain in right hand: Secondary | ICD-10-CM | POA: Diagnosis not present

## 2016-08-29 DIAGNOSIS — L814 Other melanin hyperpigmentation: Secondary | ICD-10-CM | POA: Diagnosis not present

## 2016-08-29 DIAGNOSIS — D2262 Melanocytic nevi of left upper limb, including shoulder: Secondary | ICD-10-CM | POA: Diagnosis not present

## 2016-08-29 DIAGNOSIS — L720 Epidermal cyst: Secondary | ICD-10-CM | POA: Diagnosis not present

## 2016-08-29 DIAGNOSIS — I8391 Asymptomatic varicose veins of right lower extremity: Secondary | ICD-10-CM | POA: Diagnosis not present

## 2016-08-29 DIAGNOSIS — I8392 Asymptomatic varicose veins of left lower extremity: Secondary | ICD-10-CM | POA: Diagnosis not present

## 2016-08-29 DIAGNOSIS — D692 Other nonthrombocytopenic purpura: Secondary | ICD-10-CM | POA: Diagnosis not present

## 2016-08-29 DIAGNOSIS — L821 Other seborrheic keratosis: Secondary | ICD-10-CM | POA: Diagnosis not present

## 2016-08-31 ENCOUNTER — Encounter (HOSPITAL_COMMUNITY): Payer: Self-pay

## 2016-08-31 ENCOUNTER — Emergency Department (HOSPITAL_COMMUNITY)
Admission: EM | Admit: 2016-08-31 | Discharge: 2016-08-31 | Disposition: A | Discharge status: Home / Self Care | Payer: PPO | Attending: Emergency Medicine | Admitting: Emergency Medicine

## 2016-08-31 ENCOUNTER — Emergency Department (HOSPITAL_COMMUNITY): Payer: PPO

## 2016-08-31 DIAGNOSIS — Y93A1 Activity, exercise machines primarily for cardiorespiratory conditioning: Secondary | ICD-10-CM | POA: Diagnosis not present

## 2016-08-31 DIAGNOSIS — M25562 Pain in left knee: Secondary | ICD-10-CM | POA: Diagnosis not present

## 2016-08-31 DIAGNOSIS — X503XXA Overexertion from repetitive movements, initial encounter: Secondary | ICD-10-CM | POA: Diagnosis not present

## 2016-08-31 DIAGNOSIS — S79922A Unspecified injury of left thigh, initial encounter: Secondary | ICD-10-CM | POA: Diagnosis not present

## 2016-08-31 DIAGNOSIS — S76312A Strain of muscle, fascia and tendon of the posterior muscle group at thigh level, left thigh, initial encounter: Secondary | ICD-10-CM

## 2016-08-31 DIAGNOSIS — S76112A Strain of left quadriceps muscle, fascia and tendon, initial encounter: Secondary | ICD-10-CM | POA: Diagnosis not present

## 2016-08-31 DIAGNOSIS — Y998 Other external cause status: Secondary | ICD-10-CM | POA: Diagnosis not present

## 2016-08-31 DIAGNOSIS — Z79899 Other long term (current) drug therapy: Secondary | ICD-10-CM | POA: Insufficient documentation

## 2016-08-31 DIAGNOSIS — M62838 Other muscle spasm: Secondary | ICD-10-CM | POA: Diagnosis not present

## 2016-08-31 DIAGNOSIS — S8992XA Unspecified injury of left lower leg, initial encounter: Secondary | ICD-10-CM | POA: Diagnosis not present

## 2016-08-31 DIAGNOSIS — Y929 Unspecified place or not applicable: Secondary | ICD-10-CM | POA: Diagnosis not present

## 2016-08-31 MED ORDER — NAPROXEN 500 MG PO TABS: 500.0000 mg | ORAL_TABLET | Freq: Two times a day (BID) | ORAL | 0 refills | Status: AC

## 2016-08-31 NOTE — ED Triage Notes (Signed)
Pt c/o intermittent L knee pain x 2 weeks.  Pain score 10/10 w/ movement.  Pt reports that the pain began "after doing 6 miles on a stationary bike."  Sts pain increased after golfing yesterday.  Sts she is unable to walk.  Denies previous injury or surgery.

## 2016-08-31 NOTE — ED Provider Notes (Addendum)
[facility] DEPT Provider Note   CSN: [id number] Arrival date & time: 08/31/16  1527     History   Chief Complaint Chief Complaint  Patient presents with  . Knee Pain    HPI [name] is a 75 y.o. female.  The history is provided by the patient.  Leg Pain   This is a new problem. The current episode started 12 to 24 hours ago. The problem occurs constantly. The problem has been gradually worsening. The pain is present in the left upper leg. The quality of the pain is described as aching. The pain is moderate. Associated symptoms include stiffness. Pertinent negatives include full range of motion. She has tried activity for the symptoms. The treatment provided no relief. There has been no history of extremity trauma.    Past Medical History:  Diagnosis Date  . Grave's disease   . Reflux     There are no active problems to display for this patient.   Past Surgical History:  Procedure Laterality Date  . ABDOMINAL HYSTERECTOMY    . SALIVARY GLAND SURGERY      OB History    No data available       Home Medications    Prior to Admission medications   Medication Sig Start Date End Date Taking? Authorizing Provider  diazepam (VALIUM) 5 MG tablet Take 1 tablet (5 mg total) by mouth every 6 (six) hours as needed for anxiety (spasms). 04/19/16   Daleen Bo, MD  glucosamine-chondroitin 500-400 MG tablet Take 1 tablet by mouth daily.    Historical Provider, MD  Multiple Vitamin (MULTIVITAMIN WITH MINERALS) TABS tablet Take 1 tablet by mouth daily.    Historical Provider, MD  predniSONE (DELTASONE) 20 MG tablet Take 1 tablet (20 mg total) by mouth 2 (two) times daily. 04/19/16   Daleen Bo, MD  SYNTHROID 200 MCG tablet Take 100 mcg by mouth daily. 02/18/16   Historical Provider, MD  traMADol (ULTRAM) 50 MG tablet Take 1 tablet (50 mg total) by mouth every 6 (six) hours as needed. 04/19/16   Daleen Bo, MD    Family History History reviewed. No pertinent family  history.  Social History Social History  Substance Use Topics  . Smoking status: Never Smoker  . Smokeless tobacco: Never Used  . Alcohol use No     Allergies   Bactrim; Erythromycin; Nitrofurantoin monohyd macro; and Vicodin [hydrocodone-acetaminophen]   Review of Systems Review of Systems  Musculoskeletal: Positive for stiffness.  All other systems reviewed and are negative.    Physical Exam Updated Vital Signs BP 162/85 (BP Location: Right Arm)   Pulse 70   Temp 98.2 F (36.8 C) (Oral)   Resp 16   SpO2 99%   Physical Exam  Constitutional: She is oriented to person, place, and time. She appears well-developed and well-nourished. No distress.  HENT:  Head: Normocephalic.  Eyes: Conjunctivae are normal.  Neck: Neck supple. No tracheal deviation present.  Cardiovascular: Normal rate and regular rhythm.   Pulmonary/Chest: Effort normal. No respiratory distress.  Abdominal: Soft. She exhibits no distension.  Musculoskeletal:       Left knee: She exhibits normal range of motion, no swelling, no effusion, no LCL laxity, no bony tenderness and no MCL laxity. No tenderness found.       Right upper leg: She exhibits no tenderness, no bony tenderness, no swelling and no deformity.       Left upper leg: She exhibits tenderness (over distal hamstring and quadriceps with  pain exacerbated by stretching ). She exhibits no bony tenderness, no swelling and no deformity.  Neurological: She is alert and oriented to person, place, and time.  Skin: Skin is warm and dry.  Psychiatric: She has a normal mood and affect.     ED Treatments / Results  Labs (all labs ordered are listed, but only abnormal results are displayed) Labs Reviewed - No data to display  EKG  EKG Interpretation None       Radiology Dg Knee Complete 4 Views Left  Result Date: 08/31/2016 CLINICAL DATA:  Pain without trauma EXAM: LEFT KNEE - COMPLETE 4+ VIEW COMPARISON:  None. FINDINGS: Minimal degenerative  changes. No fracture, dislocation, or joint effusion. IMPRESSION: Minimal degenerative change. Electronically Signed   By: Dorise Bullion III M.D   On: 08/31/2016 17:08    Procedures Procedures (including critical care time)  Medications Ordered in ED Medications - No data to display   Initial Impression / Assessment and Plan / ED Course  I have reviewed the triage vital signs and the nursing notes.  Pertinent labs & imaging results that were available during my care of the patient were reviewed by me and considered in my medical decision making (see chart for details).  Clinical Course    75 y.o. female presents with left leg pain since riding stationary bicycle and playing golf. Has worsened. Pain localized to large muscle groups of thigh. Discussed appropriate stretches to help with clinical syndrome. Plain film negative for bony abnormality to explain pain. Patient was recommended to take short course of scheduled NSAIDs and engage in early mobility as definitive treatment. Plan to follow up with PCP as needed and return precautions discussed for worsening or new concerning symptoms.   Final Clinical Impressions(s) / ED Diagnoses   Final diagnoses:  Quadriceps strain, left, initial encounter  Left hamstring muscle strain, initial encounter  Muscle spasm of left lower extremity    New Prescriptions Discharge Medication List as of 08/31/2016  5:53 PM    START taking these medications   Details  naproxen (NAPROSYN) 500 MG tablet Take 1 tablet (500 mg total) by mouth 2 (two) times daily with a meal., Starting Sun 08/31/2016, Until Fri 09/05/2016, Print         Leo Grosser, MD 09/01/16 [id number]    Leo Grosser, MD 09/01/16 [phone]

## 2016-09-18 DIAGNOSIS — M65331 Trigger finger, right middle finger: Secondary | ICD-10-CM | POA: Diagnosis not present

## 2016-10-09 DIAGNOSIS — Z23 Encounter for immunization: Secondary | ICD-10-CM | POA: Diagnosis not present

## 2016-10-09 DIAGNOSIS — M25569 Pain in unspecified knee: Secondary | ICD-10-CM | POA: Diagnosis not present

## 2016-10-13 DIAGNOSIS — M170 Bilateral primary osteoarthritis of knee: Secondary | ICD-10-CM | POA: Diagnosis not present

## 2016-10-13 DIAGNOSIS — M7050 Other bursitis of knee, unspecified knee: Secondary | ICD-10-CM | POA: Diagnosis not present

## 2016-10-13 DIAGNOSIS — M25562 Pain in left knee: Secondary | ICD-10-CM | POA: Diagnosis not present

## 2016-10-16 DIAGNOSIS — M65331 Trigger finger, right middle finger: Secondary | ICD-10-CM | POA: Diagnosis not present

## 2016-10-27 DIAGNOSIS — Z90712 Acquired absence of cervix with remaining uterus: Secondary | ICD-10-CM | POA: Diagnosis not present

## 2016-10-27 DIAGNOSIS — Z1231 Encounter for screening mammogram for malignant neoplasm of breast: Secondary | ICD-10-CM | POA: Diagnosis not present

## 2016-10-27 DIAGNOSIS — Z124 Encounter for screening for malignant neoplasm of cervix: Secondary | ICD-10-CM | POA: Diagnosis not present

## 2016-10-27 DIAGNOSIS — Z6833 Body mass index (BMI) 33.0-33.9, adult: Secondary | ICD-10-CM | POA: Diagnosis not present

## 2016-10-27 DIAGNOSIS — Z1272 Encounter for screening for malignant neoplasm of vagina: Secondary | ICD-10-CM | POA: Diagnosis not present

## 2016-12-22 HISTORY — PX: TRIGGER FINGER RELEASE: SHX641

## 2017-05-27 DIAGNOSIS — M25562 Pain in left knee: Secondary | ICD-10-CM | POA: Diagnosis not present

## 2017-05-27 DIAGNOSIS — E032 Hypothyroidism due to medicaments and other exogenous substances: Secondary | ICD-10-CM | POA: Diagnosis not present

## 2017-05-27 DIAGNOSIS — Z0000 Encounter for general adult medical examination without abnormal findings: Secondary | ICD-10-CM | POA: Diagnosis not present

## 2017-05-27 DIAGNOSIS — M47816 Spondylosis without myelopathy or radiculopathy, lumbar region: Secondary | ICD-10-CM | POA: Diagnosis not present

## 2017-05-27 DIAGNOSIS — E034 Atrophy of thyroid (acquired): Secondary | ICD-10-CM | POA: Diagnosis not present

## 2017-05-27 DIAGNOSIS — E559 Vitamin D deficiency, unspecified: Secondary | ICD-10-CM | POA: Diagnosis not present

## 2017-05-27 DIAGNOSIS — R1310 Dysphagia, unspecified: Secondary | ICD-10-CM | POA: Diagnosis not present

## 2017-05-27 DIAGNOSIS — R5383 Other fatigue: Secondary | ICD-10-CM | POA: Diagnosis not present

## 2017-05-27 DIAGNOSIS — M6281 Muscle weakness (generalized): Secondary | ICD-10-CM | POA: Diagnosis not present

## 2017-05-28 DIAGNOSIS — E032 Hypothyroidism due to medicaments and other exogenous substances: Secondary | ICD-10-CM | POA: Diagnosis not present

## 2017-05-28 DIAGNOSIS — R5383 Other fatigue: Secondary | ICD-10-CM | POA: Diagnosis not present

## 2017-06-03 DIAGNOSIS — H43813 Vitreous degeneration, bilateral: Secondary | ICD-10-CM | POA: Diagnosis not present

## 2017-06-03 DIAGNOSIS — Z961 Presence of intraocular lens: Secondary | ICD-10-CM | POA: Diagnosis not present

## 2017-06-03 DIAGNOSIS — H532 Diplopia: Secondary | ICD-10-CM | POA: Diagnosis not present

## 2017-06-15 DIAGNOSIS — M65331 Trigger finger, right middle finger: Secondary | ICD-10-CM | POA: Diagnosis not present

## 2017-06-15 DIAGNOSIS — M654 Radial styloid tenosynovitis [de Quervain]: Secondary | ICD-10-CM | POA: Diagnosis not present

## 2017-06-25 DIAGNOSIS — H0015 Chalazion left lower eyelid: Secondary | ICD-10-CM | POA: Diagnosis not present

## 2017-06-25 DIAGNOSIS — H109 Unspecified conjunctivitis: Secondary | ICD-10-CM | POA: Diagnosis not present

## 2017-07-13 DIAGNOSIS — M65331 Trigger finger, right middle finger: Secondary | ICD-10-CM | POA: Diagnosis not present

## 2017-07-13 DIAGNOSIS — M654 Radial styloid tenosynovitis [de Quervain]: Secondary | ICD-10-CM | POA: Diagnosis not present

## 2017-07-17 ENCOUNTER — Ambulatory Visit (INDEPENDENT_AMBULATORY_CARE_PROVIDER_SITE_OTHER): Payer: PPO | Admitting: Neurology

## 2017-07-17 ENCOUNTER — Encounter: Payer: Self-pay | Admitting: Neurology

## 2017-07-17 ENCOUNTER — Telehealth: Payer: Self-pay | Admitting: *Deleted

## 2017-07-17 VITALS — BP 143/74 | HR 70 | Ht 63.0 in | Wt 174.0 lb

## 2017-07-17 DIAGNOSIS — M6281 Muscle weakness (generalized): Secondary | ICD-10-CM | POA: Diagnosis not present

## 2017-07-17 NOTE — Telephone Encounter (Signed)
Tried calling office again. Have not received fax yet. Office was closed. Will try again Monday.

## 2017-07-17 NOTE — Telephone Encounter (Signed)
-----   Message from Kathrynn Ducking, MD sent at 07/17/2017  9:52 AM EDT ----- Please call the office of Dr. Wilson Singer to get the results of recent blood work done in June of 2018. Thanks.

## 2017-07-17 NOTE — Patient Instructions (Signed)
   We will get blood work today and get EMG and legs to look at nerve and muscle function.

## 2017-07-17 NOTE — Telephone Encounter (Signed)
LVM for Lattie Haw in medical records to fax over lab results done in June 2018 to [phone]. Gave pt name/DOB. Gave [facility] phone number if she has any questions.

## 2017-07-17 NOTE — Progress Notes (Signed)
Reason for visit: Muscle weakness  Referring physician: Dr. Veva Holes [name] is a 76 y.o. female  History of present illness:  [name] is a 76 year old right-handed white female with a history of some problems with muscle weakness involving the lower extremities that have been present for least 6 months. The patient has problems mainly with getting up out of a chair or going up and downstairs, she has a 2 level house and she has to walk up each step one at a time. The patient has not had any falls. She does report some discomfort in the left buttock area, she has a history of low back problems in the past and never had surgery done. The patient denies any issues controlling the bowels or the bladder. She does have some numbness and tingly sensations in the feet at nighttime, she does not note this during the day. She may have some slight fatigue of the legs with walking, she denies any problems with weakness or fatigue of the upper extremities. 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 The patient reports no problems with speech or swallowing. She has had blood work done that included a CK enzyme level, ANA, rheumatoid factor, and sedimentation rate as well as a vitamin B12 level but these results are not available to me. The patient is sent to this office for evaluation of the leg weakness.  Past Medical History:  Diagnosis Date  . Grave's disease   . Reflux     Past Surgical History:  Procedure Laterality Date  . ABDOMINAL HYSTERECTOMY    . SALIVARY GLAND SURGERY    . TRIGGER FINGER RELEASE  2018  . TRIGGER FINGER RELEASE  2018    Family History  Problem Relation Age of Onset  . Mental illness Maternal Grandmother     Social history:  reports that she has quit smoking. She has  never used smokeless tobacco. She reports that she does not drink alcohol or use drugs.  Medications:  Prior to Admission medications   Medication Sig Start Date End Date Taking? Authorizing Provider  glucosamine-chondroitin 500-400 MG tablet Take 1 tablet by mouth daily. Liquid form   Yes [provider]  meloxicam (MOBIC) 15 MG tablet  06/05/17  Yes [provider]  Multiple Vitamin (MULTIVITAMIN WITH MINERALS) TABS tablet Take 1 tablet by mouth daily.   Yes [provider]  SYNTHROID 200 MCG tablet Take 100 mcg by mouth daily. 02/18/16  Yes [provider]      Allergies  Allergen Reactions  . Bactrim Other (See Comments)    Unsure childhood reaction.  . Erythromycin Hives    Broke out in hives  . Nitrofurantoin Monohyd Macro Rash  . Vicodin [Hydrocodone-Acetaminophen] Rash    ROS:  Out of a complete 14 system review of symptoms, the patient complains only of the following symptoms, and all other reviewed systems are negative.  Joint pain, muscle cramps, aching muscles Memory loss  Blood pressure (!) 143/74, pulse 70, height 5\' 3"  (1.6 m), weight 174 lb (78.9 kg).  Physical Exam  General: The patient is alert and cooperative at the time of the examination.  Eyes: Pupils are equal, round, and reactive to light. Discs are flat bilaterally.  Neck: The neck is  supple, no carotid bruits are noted.  Respiratory: The respiratory examination is clear.  Cardiovascular: The cardiovascular examination reveals a regular rate and rhythm, no obvious murmurs or rubs are noted.  Skin: Extremities are without significant edema.  Neurologic Exam  Mental status: The patient is alert and oriented x 3 at the time of the examination. The patient has apparent normal recent and remote memory, with an apparently normal attention span and concentration ability.  Cranial nerves: Facial symmetry is present. There is good sensation of the face to pinprick and  soft touch bilaterally. The strength of the facial muscles and the muscles to head turning and shoulder shrug are normal bilaterally. Speech is well enunciated, no aphasia or dysarthria is noted. Extraocular movements are full. Visual fields are full. The tongue is midline, and the patient has symmetric elevation of the soft palate. No obvious hearing deficits are noted.  Motor: The motor testing reveals 5 over 5 strength of all 4 extremities, with exception of 4/5 strength with hip flexion bilaterally. Good symmetric motor tone is noted throughout.  Sensory: Sensory testing is intact to pinprick, soft touch, vibration sensation, and position sense on all 4 extremities, with exception of a stocking pattern pinprick sensory deficit two thirds the way up the legs bilaterally. No evidence of extinction is noted.  Coordination: Cerebellar testing reveals good finger-nose-finger and heel-to-shin bilaterally.  Gait and station: Gait is normal. Tandem gait is slightly unsteady. Romberg is negative. No drift is seen.  Reflexes: Deep tendon reflexes are symmetric and normal bilaterally. Ankle jerk reflexes are well-maintained bilaterally. Toes are downgoing bilaterally.   Assessment/Plan:  1. Lower extremity weakness  2. Reported memory disturbance  The patient has reported some problems with memory just over the last 2 months. A further evaluation may be necessary for this in the future. We will try to get the blood work reports from Dr. Eugenio Hoes office, he has recently retired. The patient will be set up for further blood work today, and she will have nerve conduction studies on both legs and EMG on one leg. She will follow-up for this study.  Jill Alexanders MD 07/17/2017 9:48 AM  [facility] [address] [place], [place] [address]  Phone [phone] Fax [phone]

## 2017-07-20 NOTE — Telephone Encounter (Signed)
FYI-Received lab results via fax. Gave to CW,MD for review.

## 2017-07-20 NOTE — Telephone Encounter (Signed)
I received the blood work done through Dr. Wilson Singer.  TSH is 0.874, total T4 is 10 which is normal. Vitamin B12 level is 879, sedimentation rate of 8, vitamin D level of 33.6, ANA is negative, CCP antibody of 9 which is normal. CK enzyme level is 84. Chemistry profile shows an normal liver profile, albumin of 4.4, BUN of 15, calcium of 9.7, CO2 26 chloride 105 creatinine of 0.69, total protein 7.0, sodium of 144, potassium 4.2.

## 2017-07-28 ENCOUNTER — Telehealth: Payer: Self-pay | Admitting: *Deleted

## 2017-07-28 NOTE — Telephone Encounter (Signed)
Called and spoke with patient about lab results per CW,MD note. Pt verbalized understanding.  Reminded her of appt for EMG/NCS test on 8/14 at 10:15am, check in 945am.

## 2017-07-28 NOTE — Telephone Encounter (Signed)
-----   Message from Kathrynn Ducking, MD sent at 07/27/2017  5:19 PM EDT -----  The VGCC antibody test has been delayed. The other blood work is unremarkable. Please call the patient. We will call her once the other test is available. ----- Message ----- From: Interface, [facility] Lab Results In Sent: 07/18/2017  11:37 AM To: Kathrynn Ducking, MD

## 2017-07-31 ENCOUNTER — Telehealth: Payer: Self-pay | Admitting: *Deleted

## 2017-07-31 LAB — ACETYLCHOLINE RECEPTOR, BINDING: AChR Binding Ab, Serum: <0.03 nmol/L (ref 0.00–0.24)

## 2017-07-31 LAB — ANGIOTENSIN CONVERTING ENZYME: ANGIO CONVERT ENZYME: 29 U/L (ref 14–82)

## 2017-07-31 LAB — VGCC ANTIBODY: VGCC Antibody: NEGATIVE

## 2017-07-31 NOTE — Telephone Encounter (Signed)
Called and LVM for letting her know rest of blood work that was pending, unremarkable. Gave [facility] phone number if she has further questions.

## 2017-07-31 NOTE — Telephone Encounter (Signed)
-----   Message from Kathrynn Ducking, MD sent at 07/31/2017 10:34 AM EDT -----   The blood work results are unremarkable. Please call the patient.  ----- Message ----- From: Lavone Neri Lab Results In Sent: 07/18/2017  11:37 AM To: Kathrynn Ducking, MD

## 2017-08-04 ENCOUNTER — Ambulatory Visit (INDEPENDENT_AMBULATORY_CARE_PROVIDER_SITE_OTHER): Payer: Self-pay | Admitting: Neurology

## 2017-08-04 ENCOUNTER — Encounter: Payer: Self-pay | Admitting: Neurology

## 2017-08-04 ENCOUNTER — Ambulatory Visit (INDEPENDENT_AMBULATORY_CARE_PROVIDER_SITE_OTHER): Payer: PPO | Admitting: Neurology

## 2017-08-04 DIAGNOSIS — M6281 Muscle weakness (generalized): Secondary | ICD-10-CM

## 2017-08-04 NOTE — Progress Notes (Signed)
EMG and nerve conduction study done today was unremarkable. The patient will be sent for MRI of the lumbar spine to evaluate for the etiology of the oh or extremity weakness.

## 2017-08-04 NOTE — Procedures (Signed)
     HISTORY:  [name] is a 76 year old patient with a history of leg weakness bilaterally. The patient does have a history of low back pain, she has some episodes of left-sided sciatica. She is being evaluated for the leg weakness.  NERVE CONDUCTION STUDIES:  Nerve conduction studies were performed on both lower extremities. The distal motor latencies and motor amplitudes for the peroneal and posterior tibial nerves were within normal limits. The nerve conduction velocities for these nerves were also normal. The H reflex latencies were normal. The sensory latencies for the peroneal nerves were within normal limits.   EMG STUDIES:  EMG study was performed on the left lower extremity:  The tibialis anterior muscle reveals 2 to 4K motor units with full recruitment. No fibrillations or positive waves were seen. The peroneus tertius muscle reveals 2 to 4K motor units with full recruitment. No fibrillations or positive waves were seen. The medial gastrocnemius muscle reveals 1 to 3K motor units with full recruitment. No fibrillations or positive waves were seen. The vastus lateralis muscle reveals 2 to 4K motor units with full recruitment. No fibrillations or positive waves were seen. The iliopsoas muscle reveals 2 to 4K motor units with full recruitment. No fibrillations or positive waves were seen. The biceps femoris muscle (long head) reveals 2 to 4K motor units with full recruitment. No fibrillations or positive waves were seen. The lumbosacral paraspinal muscles were tested at 3 levels, and revealed no abnormalities of insertional activity at all 3 levels tested. There was good relaxation.   IMPRESSION:  Nerve conduction studies done on both lower extremities were unremarkable without evidence of a peripheral neuropathy. EMG evaluation of the left lower extremity was unremarkable without evidence of a neuropathic or a myopathic etiology of her leg weakness.  Jill Alexanders  MD 08/04/2017 11:33 AM  [facility] [address] [place], [place] [address]  Phone [phone] Fax [phone]

## 2017-08-04 NOTE — Progress Notes (Signed)
Please refer to EMG and nerve conduction study procedure note. 

## 2017-08-10 DIAGNOSIS — M67331 Transient synovitis, right wrist: Secondary | ICD-10-CM | POA: Diagnosis not present

## 2017-08-10 DIAGNOSIS — M654 Radial styloid tenosynovitis [de Quervain]: Secondary | ICD-10-CM | POA: Diagnosis not present

## 2017-08-10 DIAGNOSIS — M65331 Trigger finger, right middle finger: Secondary | ICD-10-CM | POA: Diagnosis not present

## 2017-08-13 ENCOUNTER — Telehealth: Payer: Self-pay | Admitting: Neurology

## 2017-08-13 NOTE — Telephone Encounter (Signed)
Patient called me and informed me that she would like to have her MRI at [facility]. I informed her that I will have to re-submitted information for her insurance to get it approve since the location is changing from [place]. She stated since Dr. Apolonio Schneiders is having a MRI of the hand done there she would like to have the lumbar there as well.

## 2017-08-31 ENCOUNTER — Encounter: Payer: Self-pay | Admitting: Neurology

## 2017-08-31 DIAGNOSIS — M67331 Transient synovitis, right wrist: Secondary | ICD-10-CM | POA: Diagnosis not present

## 2017-08-31 DIAGNOSIS — M6281 Muscle weakness (generalized): Secondary | ICD-10-CM | POA: Diagnosis not present

## 2017-09-09 ENCOUNTER — Telehealth: Payer: Self-pay | Admitting: Neurology

## 2017-09-09 DIAGNOSIS — R29898 Other symptoms and signs involving the musculoskeletal system: Secondary | ICD-10-CM

## 2017-09-09 NOTE — Telephone Encounter (Signed)
I called patient. The MRI of the low back showed some evidence of mild spinal stenosis at the L4-5 level, severe left and mild right foraminal narrowing was noted possibly affecting the left L4 nerve root. Otherwise, no significant nerve root impingement was seen. Nothing to explain bilateral leg weakness.  The EMG and nerve conduction study was unremarkable.  With consider physical therapy for leg strengthening exercises, the patient has some weakness with hip flexion bilaterally.  MRI of the lumbar spine shows evidence of a 1.7 cm mass in the lower pole of the right kidney. The patient may require further evaluation of this issue.

## 2017-09-11 NOTE — Addendum Note (Signed)
Addended by: Kathrynn Ducking on: 09/11/2017 01:05 PM   Modules accepted: Orders

## 2017-09-11 NOTE — Telephone Encounter (Signed)
Pt states that when Dr Jannifer Franklin called her he stated to call the office re: her being set up for physical therapy.  Pt is calling stating she would like to be set up for the physical therapy and is asking for a call back

## 2017-09-11 NOTE — Telephone Encounter (Signed)
I called the patient. I discussed the right kidney mass, the patient is set up an appointment with the primary care physician to get this looked at. Her son has renal cell carcinoma.  The low back does show some possible impingement of the left L4 nerve root, but this would not cause bilateral leg weakness.  The EMG evaluation was normal, therefore we will get the patient set up for his cold therapy for leg strengthening exercises. The patient is amenable to this.

## 2017-09-15 DIAGNOSIS — M654 Radial styloid tenosynovitis [de Quervain]: Secondary | ICD-10-CM | POA: Diagnosis not present

## 2017-09-21 ENCOUNTER — Ambulatory Visit: Payer: PPO | Attending: Neurology | Admitting: Physical Therapy

## 2017-09-21 DIAGNOSIS — M545 Low back pain, unspecified: Secondary | ICD-10-CM

## 2017-09-21 DIAGNOSIS — R293 Abnormal posture: Secondary | ICD-10-CM

## 2017-09-21 DIAGNOSIS — G8929 Other chronic pain: Secondary | ICD-10-CM

## 2017-09-21 DIAGNOSIS — M6281 Muscle weakness (generalized): Secondary | ICD-10-CM | POA: Insufficient documentation

## 2017-09-21 NOTE — Therapy (Signed)
[facility] [address] [place], [place], [address] Phone: [phone]   Fax:  [phone]  Physical Therapy Evaluation  Patient Details  Name: [name] MRN: [id number] Date of Birth: 1941/10/11 Referring Provider: Dr. Floyde Parkins  Encounter Date: 09/21/2017      PT End of Session - 09/21/17 0918    Visit Number 1   Number of Visits 12   Date for PT Re-Evaluation 11/02/17   Authorization Type [employer]   PT Start Time 0837   PT Stop Time 0915   PT Time Calculation (min) 38 min   Activity Tolerance Patient tolerated treatment well   Behavior During Therapy [facility] for tasks assessed/performed      Past Medical History:  Diagnosis Date  . Grave's disease   . Reflux     Past Surgical History:  Procedure Laterality Date  . ABDOMINAL HYSTERECTOMY    . SALIVARY GLAND SURGERY    . TRIGGER FINGER RELEASE  2018  . TRIGGER FINGER RELEASE  2018    There were no vitals filed for this visit.       Subjective Assessment - 09/21/17 0838    Subjective Pt is a 76 y/o female who presents to OPPT with c/o lower extremity weakness and Lt sided buttock pain.  Pt reports hx of ruptured disc 35 years ago, but pain worsened ~ 1 year ago when she pulled her hamstring.   Pertinent History Grave's Disease   How long can you sit comfortably? difficulty with standing after sitting   Patient Stated Goals improve mobility, wants to play golf again (last time 2 weeks ago), get up/down from chairs/golf cart without UE support   Currently in Pain? Yes   Pain Score 0-No pain  up to 3/10   Pain Location Buttocks   Pain Orientation Left   Pain Descriptors / Indicators Sharp   Pain Type Chronic pain   Pain Onset More than a month ago   Pain Frequency Intermittent   Aggravating Factors  mornings   Pain Relieving Factors resolves with walking            [facility] PT Assessment - 09/21/17 0842      Assessment   Medical Diagnosis LBP; lower extremity weakness   Referring Provider Dr. Floyde Parkins   Onset Date/Surgical Date --  1 year ago   Next MD Visit PRN   Prior Therapy n/a     Precautions   Precautions None     Restrictions   Weight Bearing Restrictions No     Balance Screen   Has the patient fallen in the past 6 months No   Has the patient had a decrease in activity level because of a fear of falling?  No   Is the patient reluctant to leave their home because of a fear of falling?  No     Home Environment   Living Environment Private residence   Living Arrangements Spouse/significant other   Type of [place] to enter   Entrance Stairs-Number of Steps 3   Entrance Stairs-Rails None  holds onto side of house   Home Layout Two level;Bed/bath upstairs   Alternate Level Stairs-Number of Steps 14   Alternate Level Stairs-Rails Right     Prior Function   Level of Independence Independent   Vocation Retired   [employer] retired from [employer] (playing in a leage 2x/wk - recreational only at this time);  walks 2 miles daily, play bridge     Cognition   Overall Cognitive Status Within Functional Limits for tasks assessed     Observation/Other Assessments   Focus on Therapeutic Outcomes (FOTO)  69 (31% limited; predicted 24% limited)   Neuro Quality of Life  Lower Extremity: 44.8     Posture/Postural Control   Posture/Postural Control Postural limitations   Postural Limitations Increased lumbar lordosis;Rounded Shoulders;Forward head     ROM / Strength   AROM / PROM / Strength AROM;Strength     AROM   Overall AROM Comments Lumbar ROM WNL without increase in pain   AROM Assessment Site --     Strength   Strength Assessment Site Hip;Ankle;Knee   Right/Left Hip Right;Left   Right Hip Flexion 3/5   Right Hip Extension 3-/5   Right Hip ABduction 4-/5   Left Hip Flexion 3/5   Left Hip Extension 2+/5   Left Hip ABduction 3+/5    Right/Left Knee Right;Left   Right Knee Flexion 4/5   Right Knee Extension 5/5   Left Knee Flexion 3+/5   Left Knee Extension 5/5   Right/Left Ankle Right;Left   Right Ankle Dorsiflexion 5/5   Left Ankle Dorsiflexion 5/5     Flexibility   Soft Tissue Assessment /Muscle Length yes   Hamstrings tightness Lt>Rt   Piriformis tightness Lt>Rt     Palpation   SI assessment  WNL   Palpation comment tenderness glut min/max and near piriformis with active trigger points in hamstring on Lt     Special Tests    Special Tests Lumbar   Lumbar Tests Straight Leg Raise     Straight Leg Raise   Findings Negative     Transfers   Five time sit to stand comments  21.50 sec without UE support- increased difficulty reps 3-5            Objective measurements completed on examination: See above findings.          [facility] Adult PT Treatment/Exercise - 09/21/17 0842      Exercises   Exercises Lumbar     Lumbar Exercises: Stretches   Passive Hamstring Stretch 2 reps;30 seconds   Passive Hamstring Stretch Limitations Lt   Piriformis Stretch 2 reps;30 seconds   Piriformis Stretch Limitations Lt                PT Education - 09/21/17 0917    Education provided Yes   Education Details HEP, DN   Person(s) Educated Patient   Methods Explanation;Demonstration;Handout;Verbal cues   Comprehension Verbalized understanding;Returned demonstration;Need further instruction;Verbal cues required             PT Long Term Goals - 09/21/17 0921      PT LONG TERM GOAL #1   Title independent with HEP   Status New   Target Date 11/02/17     PT LONG TERM GOAL #2   Title report pain </= 1/10 in the morning for improved pain and function   Status New   Target Date 11/02/17     PT LONG TERM GOAL #3   Title report ability to return to regular golf routine for improved function   Status New   Target Date 11/02/17     PT LONG TERM GOAL #4   Title perform 5x STS in < 18 sec for  improved functional strength   Status New   Target Date 11/02/17  Plan - 09/21/17 0918    Clinical Impression Statement Pt is a 76 y/o female who presents to OPPT for ~ 1 year hx of increased Lt buttock pain and affecting strength and functional mobilty.  Pt demonstrates decreased strength and functional mobilty as well as increased pain.  Pt will benefit from PT to address deficits.  Pt to transfer to [place] location for possible dry needling and clinic is closer to home.   Clinical Presentation Stable   Clinical Decision Making Low   Rehab Potential Good   PT Frequency 2x / week   PT Duration 6 weeks   PT Treatment/Interventions ADLs/Self Care Home Management;Cryotherapy;Electrical Stimulation;Moist Heat;Ultrasound;Neuromuscular re-education;Therapeutic exercise;Therapeutic activities;Functional mobility training;Gait training;Stair training;Patient/family education;Manual techniques;Taping;Dry needling   PT Next Visit Plan review stretches, add core/hip strengthening; DN to gluts/piriformis/hamstring   Consulted and Agree with Plan of Care Patient      Patient will benefit from skilled therapeutic intervention in order to improve the following deficits and impairments:  Postural dysfunction, Impaired flexibility, Decreased mobility, Decreased strength, Increased fascial restricitons, Increased muscle spasms, Pain  Visit Diagnosis: Chronic left-sided low back pain without sciatica - Plan: PT plan of care cert/re-cert  Abnormal posture - Plan: PT plan of care cert/re-cert  Muscle weakness (generalized) - Plan: PT plan of care cert/re-cert      G-Codes - 34/19/37 9024    Functional Assessment Tool Used (Outpatient Only) foto/clinical judgement   Functional Limitation Mobility: Walking and moving around   Mobility: Walking and Moving Around Current Status (O9735) At least 20 percent but less than 40 percent impaired, limited or restricted   Mobility:  Walking and Moving Around Goal Status (H2992) At least 1 percent but less than 20 percent impaired, limited or restricted       Problem List Patient Active Problem List   Diagnosis Date Noted  . Muscle weakness 07/17/2017      Laureen Abrahams, PT, DPT 09/21/17 9:24 AM    [place] [address] [place], [place], [address] Phone: [phone]   Fax:  [phone]  Name: [name] MRN: [id number] Date of Birth: January 29, 1941

## 2017-09-21 NOTE — Patient Instructions (Signed)
Hamstring Step 2    Left foot relaxed, knee straight, other leg bent, foot flat. Raise straight leg further upward to maximal range. Hold _30__ seconds. Relax leg completely down. Repeat _3__ times.  Do 2-3 sessions per day.    Piriformis Stretch    Lying on back, pull left knee toward opposite shoulder. Hold _30___ seconds. Repeat __3__ times. Do _2-3___ sessions per day.    Trigger Point Dry Needling  . What is Trigger Point Dry Needling (DN)? o DN is a physical therapy technique used to treat muscle pain and dysfunction. Specifically, DN helps deactivate muscle trigger points (muscle knots).  o A thin filiform needle is used to penetrate the skin and stimulate the underlying trigger point. The goal is for a local twitch response (LTR) to occur and for the trigger point to relax. No medication of any kind is injected during the procedure.   . What Does Trigger Point Dry Needling Feel Like?  o The procedure feels different for each individual patient. Some patients report that they do not actually feel the needle enter the skin and overall the process is not painful. Very mild bleeding may occur. However, many patients feel a deep cramping in the muscle in which the needle was inserted. This is the local twitch response.   Marland Kitchen How Will I feel after the treatment? o Soreness is normal, and the onset of soreness may not occur for a few hours. Typically this soreness does not last longer than two days.  o Bruising is uncommon, however; ice can be used to decrease any possible bruising.  o In rare cases feeling tired or nauseous after the treatment is normal. In addition, your symptoms may get worse before they get better, this period will typically not last longer than 24 hours.   . What Can I do After My Treatment? o Increase your hydration by drinking more water for the next 24 hours. o You may place ice or heat on the areas treated that have become sore, however, do not use heat on  inflamed or bruised areas. Heat often brings more relief post needling. o You can continue your regular activities, but vigorous activity is not recommended initially after the treatment for 24 hours. o DN is best combined with other physical therapy such as strengthening, stretching, and other therapies.      [facility] [address] [place], [place], [address] Phone: [phone]  Fax: [phone]

## 2017-09-22 DIAGNOSIS — R5383 Other fatigue: Secondary | ICD-10-CM | POA: Diagnosis not present

## 2017-09-22 DIAGNOSIS — N2889 Other specified disorders of kidney and ureter: Secondary | ICD-10-CM | POA: Diagnosis not present

## 2017-09-22 DIAGNOSIS — M170 Bilateral primary osteoarthritis of knee: Secondary | ICD-10-CM | POA: Diagnosis not present

## 2017-09-22 DIAGNOSIS — G4730 Sleep apnea, unspecified: Secondary | ICD-10-CM | POA: Diagnosis not present

## 2017-09-22 DIAGNOSIS — E032 Hypothyroidism due to medicaments and other exogenous substances: Secondary | ICD-10-CM | POA: Diagnosis not present

## 2017-09-22 DIAGNOSIS — M150 Primary generalized (osteo)arthritis: Secondary | ICD-10-CM | POA: Diagnosis not present

## 2017-09-23 ENCOUNTER — Ambulatory Visit (INDEPENDENT_AMBULATORY_CARE_PROVIDER_SITE_OTHER): Payer: PPO | Admitting: Physical Therapy

## 2017-09-23 DIAGNOSIS — R293 Abnormal posture: Secondary | ICD-10-CM | POA: Diagnosis not present

## 2017-09-23 DIAGNOSIS — G8929 Other chronic pain: Secondary | ICD-10-CM

## 2017-09-23 DIAGNOSIS — M545 Low back pain, unspecified: Secondary | ICD-10-CM

## 2017-09-23 DIAGNOSIS — M6281 Muscle weakness (generalized): Secondary | ICD-10-CM

## 2017-09-23 NOTE — Therapy (Signed)
[facility] [address] [place], [place], [address] Phone: [phone]   Fax:  [phone]  Physical Therapy Treatment  Patient Details  Name: [name] MRN: [id number] Date of Birth: 12-24-1940 Referring Provider: Dr. Floyde Parkins  Encounter Date: 09/23/2017      PT End of Session - 09/23/17 1158    Visit Number 2   Number of Visits 12   Date for PT Re-Evaluation 11/02/17   Authorization Type [employer]   PT Start Time 1103   PT Stop Time 1145   PT Time Calculation (min) 42 min   Activity Tolerance Patient tolerated treatment well   Behavior During Therapy [facility] for tasks assessed/performed      Past Medical History:  Diagnosis Date  . Grave's disease   . Reflux     Past Surgical History:  Procedure Laterality Date  . ABDOMINAL HYSTERECTOMY    . SALIVARY GLAND SURGERY    . TRIGGER FINGER RELEASE  2018  . TRIGGER FINGER RELEASE  2018    There were no vitals filed for this visit.      Subjective Assessment - 09/23/17 1109    Subjective Feels about the same; did exercises 1x yesterday.   Patient Stated Goals improve mobility, wants to play golf again (last time 2 weeks ago), get up/down from chairs/golf cart without UE support   Pain Score 0-No pain                         [facility] Adult PT Treatment/Exercise - 09/23/17 1111      Lumbar Exercises: Stretches   Passive Hamstring Stretch 3 reps;30 seconds   Passive Hamstring Stretch Limitations bil   Single Knee to Chest Stretch 3 reps;30 seconds   Single Knee to Chest Stretch Limitations bil   Piriformis Stretch 3 reps;30 seconds   Piriformis Stretch Limitations bil     Lumbar Exercises: Aerobic   Stationary Bike L1 x 6 min     Lumbar Exercises: Supine   Ab Set 10 reps;5 seconds                PT Education - 09/23/17 1158    Education provided Yes   Education Details added to [employer]) Educated Patient   Methods Explanation;Demonstration;Handout;Verbal cues   Comprehension Verbalized understanding;Returned demonstration;Verbal cues required;Need further instruction             PT Long Term Goals - 09/21/17 0921      PT LONG TERM GOAL #1   Title independent with HEP   Status New   Target Date 11/02/17     PT LONG TERM GOAL #2   Title report pain </= 1/10 in the morning for improved pain and function   Status New   Target Date 11/02/17     PT LONG TERM GOAL #3   Title report ability to return to regular golf routine for improved function   Status New   Target Date 11/02/17     PT LONG TERM GOAL #4   Title perform 5x STS in < 18 sec for improved functional strength   Status New   Target Date 11/02/17               Plan - 09/23/17 1159    Clinical Impression Statement Pt tolerated session well without increase in pain, but had to modify piriformis stretch to better accommodate pt.  Modified to sitting v. supine.  Increased time needed with exercises as VCs needed for proper form and technique.  Pt requested to defer DN today as she's playing in a golf tournament tomorrow and wasn't sure how she would respond.   PT Treatment/Interventions ADLs/Self Care Home Management;Cryotherapy;Electrical Stimulation;Moist Heat;Ultrasound;Neuromuscular re-education;Therapeutic exercise;Therapeutic activities;Functional mobility training;Gait training;Stair training;Patient/family education;Manual techniques;Taping;Dry needling   PT Next Visit Plan review updated HEP, hip/core strengthening; DN to gluts/piriformis/hamstring   Consulted and Agree with Plan of Care Patient      Patient will benefit from skilled therapeutic intervention in order to improve the following deficits and impairments:  Postural dysfunction, Impaired flexibility, Decreased mobility, Decreased strength, Increased fascial restricitons, Increased muscle spasms, Pain  Visit Diagnosis: Chronic left-sided low back  pain without sciatica  Abnormal posture  Muscle weakness (generalized)     Problem List Patient Active Problem List   Diagnosis Date Noted  . Muscle weakness 07/17/2017       [name], PT, DPT 09/23/17 12:02 PM     [facility] [place], [place], [address] Phone: [phone]   Fax:  [phone]  Name: [name] MRN: [id number] Date of Birth: 1941-07-04

## 2017-09-23 NOTE — Patient Instructions (Signed)
Piriformis Stretch, Sitting    Sit in a chair with your back straight, one leg supported on floor, other leg bent, ankle on opposite thigh. Grasp knee and ankle of crossed leg. Pull leg toward trunk. Feel stretch in gluteals. Hold _30__ seconds.  Repeat __3_ times per session. Do __2-3_ sessions per day.   Knee to Chest (Flexion)    Pull knee toward chest. Feel stretch in lower back or buttock area. Breathing deeply, Hold __30__ seconds. Repeat with other knee. Repeat __3__ times. Do __2-3__ sessions per day.   PELVIC STABILIZATION: Pelvic Tilt (Lying)    Exhaling, pull belly toward spine, tilting pelvis forward. Hold for 5 seconds. Inhaling, release. Repeat _10__ times. Do _2-3__ times per day.  Copyright  VHI. All rights reserved.

## 2017-09-24 DIAGNOSIS — D49511 Neoplasm of unspecified behavior of right kidney: Secondary | ICD-10-CM | POA: Diagnosis not present

## 2017-09-24 DIAGNOSIS — D3000 Benign neoplasm of unspecified kidney: Secondary | ICD-10-CM | POA: Diagnosis not present

## 2017-09-28 ENCOUNTER — Ambulatory Visit (INDEPENDENT_AMBULATORY_CARE_PROVIDER_SITE_OTHER): Payer: PPO | Admitting: Physical Therapy

## 2017-09-28 DIAGNOSIS — H8111 Benign paroxysmal vertigo, right ear: Secondary | ICD-10-CM

## 2017-09-28 DIAGNOSIS — M545 Low back pain: Secondary | ICD-10-CM

## 2017-09-28 DIAGNOSIS — M6281 Muscle weakness (generalized): Secondary | ICD-10-CM

## 2017-09-28 DIAGNOSIS — R293 Abnormal posture: Secondary | ICD-10-CM | POA: Diagnosis not present

## 2017-09-28 DIAGNOSIS — G8929 Other chronic pain: Secondary | ICD-10-CM

## 2017-09-28 NOTE — Patient Instructions (Signed)
    Knee Drop   Keep pelvis stable. Without rotating hips, slowly drop knee to side, pause, return to center, bring knee across midline toward opposite hip. Feel obliques engaging. Repeat for ___10_ times each leg.    Knee Fold   Lie on back, legs bent, arms by sides. Exhale, lifting knee to chest. Inhale, returning. Keep abdominals flat, navel to spine. Repeat __10__ times, alternating legs. Do __2__ sessions per day.  Copyright  VHI. All rights reserved.     Heel Slide to Straight   Slide one leg down to straight. Return. Be sure pelvis does not rock forward, tilt, rotate, or tip to side. Do _10__ times. Restabilize pelvis. Repeat with other leg. Do __1-2_ sets, __2_ times per day.   Bridging    Slowly raise buttocks from floor, keeping stomach tight.  Hold for 5 seconds. Repeat __10__ times per set. Do __1_ sets per session. Do __1-2__ sessions per day.   HIP: Abduction / External Rotation - Side-Lying    Lie on side with hip and knees bent. Raise top knee up, squeezing glutes. Keep ankles together. Repeat to other side.  __10_ reps per set, _1__ sets per day, _6-7__ days per week

## 2017-09-28 NOTE — Therapy (Signed)
[facility] [address] [place], [place], [address] Phone: [phone]   Fax:  [phone]  Physical Therapy Treatment  Patient Details  Name: [name] MRN: [id number] Date of Birth: Apr 01, 1941 Referring Provider: Dr. Floyde Parkins  Encounter Date: 09/28/2017      PT End of Session - 09/28/17 1403    Visit Number 3   Number of Visits 12   Date for PT Re-Evaluation 11/02/17   Authorization Type [employer]   PT Start Time 7628   PT Stop Time 1401   PT Time Calculation (min) 54 min   Activity Tolerance Patient tolerated treatment well   Behavior During Therapy [facility] for tasks assessed/performed      Past Medical History:  Diagnosis Date  . Grave's disease   . Reflux     Past Surgical History:  Procedure Laterality Date  . ABDOMINAL HYSTERECTOMY    . SALIVARY GLAND SURGERY    . TRIGGER FINGER RELEASE  2018  . TRIGGER FINGER RELEASE  2018    There were no vitals filed for this visit.      Subjective Assessment - 09/28/17 1310    Subjective no pain playing golf over the weekend; Rt hand is still bothering her.      Patient Stated Goals improve mobility, wants to play golf again (last time 2 weeks ago), get up/down from chairs/golf cart without UE support   Currently in Pain? No/denies                Vestibular Assessment - 09/28/17 1402      Positional Testing   Sidelying Test Sidelying Right     Sidelying Right   Sidelying Right Duration 8 sec   Sidelying Right Symptoms Upbeat, right rotatory nystagmus                 [facility] Adult PT Treatment/Exercise - 09/28/17 1311      Lumbar Exercises: Aerobic   Stationary Bike L1 x 6 min     Lumbar Exercises: Supine   Ab Set 10 reps;5 seconds   Clam 10 reps   Clam Limitations alternating; with ab set   Heel Slides 10 reps   Heel Slides Limitations alternating; with ab set   Bent Knee Raise 10 reps   Bent Knee Raise Limitations  alternating; with ab set   Bridge 10 reps;5 seconds     Lumbar Exercises: Sidelying   Clam 10 reps   Clam Limitations bil         Vestibular Treatment/Exercise - 09/28/17 1402      Vestibular Treatment/Exercise   Vestibular Treatment Provided Canalith Repositioning   Canalith Repositioning Epley Manuever Right      EPLEY MANUEVER RIGHT   Number of Reps  1   Overall Response Symptoms Resolved   Response Details  no symptoms following epley's; deferred additional maneuvers or testing at this time               PT Education - 09/28/17 1403    Education provided Yes   Education Details strengthening HEP; BPPV   Person(s) Educated Patient   Methods Explanation;Demonstration;Handout   Comprehension Verbalized understanding;Returned demonstration;Need further instruction             PT Long Term Goals - 09/21/17 0921      PT LONG TERM GOAL #1   Title independent with HEP   Status New   Target Date 11/02/17     PT LONG TERM GOAL #  2   Title report pain </= 1/10 in the morning for improved pain and function   Status New   Target Date 11/02/17     PT LONG TERM GOAL #3   Title report ability to return to regular golf routine for improved function   Status New   Target Date 11/02/17     PT LONG TERM GOAL #4   Title perform 5x STS in < 18 sec for improved functional strength   Status New   Target Date 11/02/17               Plan - 09/28/17 1404    Clinical Impression Statement Pt reports no pain since last session and able to play in golf tournament without difficulty.  Progressed HEP today to include strengthening exercises.  Pt c/o dizziness with rolling and positional changes in bed, mainly Rt side.  Rt sidelying revealed upbeating right rotary nystagmus.  Treated today with Rt epley's and will reassess PRN.  Will continue to benefit from PT to maximize function.   PT Frequency 2x / week   PT Duration 6 weeks   PT Treatment/Interventions ADLs/Self  Care Home Management;Cryotherapy;Electrical Stimulation;Moist Heat;Ultrasound;Neuromuscular re-education;Therapeutic exercise;Therapeutic activities;Functional mobility training;Gait training;Stair training;Patient/family education;Manual techniques;Taping;Dry needling;Canalith Repostioning;Vestibular   PT Next Visit Plan review updated HEP, hip/core strengthening; DN to gluts/piriformis/hamstring; assess BPPV PRN   Consulted and Agree with Plan of Care Patient      Patient will benefit from skilled therapeutic intervention in order to improve the following deficits and impairments:  Postural dysfunction, Impaired flexibility, Decreased mobility, Decreased strength, Increased fascial restricitons, Increased muscle spasms, Pain  Visit Diagnosis: Chronic left-sided low back pain without sciatica - Plan: PT plan of care cert/re-cert  Abnormal posture - Plan: PT plan of care cert/re-cert  Muscle weakness (generalized) - Plan: PT plan of care cert/re-cert  BPPV (benign paroxysmal positional vertigo), right - Plan: PT plan of care cert/re-cert     Problem List Patient Active Problem List   Diagnosis Date Noted  . Muscle weakness 07/17/2017      Laureen Abrahams, PT, DPT 09/28/17 2:08 PM    [facility] [place], [place], [address] Phone: [phone]   Fax:  [phone]  Name: [name] MRN: [id number] Date of Birth: [birth year]

## 2017-09-30 DIAGNOSIS — K8020 Calculus of gallbladder without cholecystitis without obstruction: Secondary | ICD-10-CM | POA: Diagnosis not present

## 2017-09-30 DIAGNOSIS — D49511 Neoplasm of unspecified behavior of right kidney: Secondary | ICD-10-CM | POA: Diagnosis not present

## 2017-10-09 ENCOUNTER — Ambulatory Visit (INDEPENDENT_AMBULATORY_CARE_PROVIDER_SITE_OTHER): Payer: PPO | Admitting: Physical Therapy

## 2017-10-09 DIAGNOSIS — H8111 Benign paroxysmal vertigo, right ear: Secondary | ICD-10-CM | POA: Diagnosis not present

## 2017-10-09 DIAGNOSIS — R293 Abnormal posture: Secondary | ICD-10-CM | POA: Diagnosis not present

## 2017-10-09 DIAGNOSIS — G8929 Other chronic pain: Secondary | ICD-10-CM | POA: Diagnosis not present

## 2017-10-09 DIAGNOSIS — M6281 Muscle weakness (generalized): Secondary | ICD-10-CM

## 2017-10-09 DIAGNOSIS — M545 Low back pain, unspecified: Secondary | ICD-10-CM

## 2017-10-09 NOTE — Therapy (Signed)
[facility] [address] [place], [place], [address] Phone: [phone]   Fax:  [phone]  Physical Therapy Treatment  Patient Details  Name: [name] MRN: [id number] Date of Birth: [birth year] Referring Provider: Dr. Floyde Parkins  Encounter Date: 10/09/2017      PT End of Session - 10/09/17 0943    Visit Number 4   Number of Visits 12   Date for PT Re-Evaluation 11/02/17   Authorization Type [employer]   PT Start Time 0852   PT Stop Time 0932   PT Time Calculation (min) 40 min   Activity Tolerance Patient tolerated treatment well   Behavior During Therapy [facility] for tasks assessed/performed      Past Medical History:  Diagnosis Date  . Grave's disease   . Reflux     Past Surgical History:  Procedure Laterality Date  . ABDOMINAL HYSTERECTOMY    . SALIVARY GLAND SURGERY    . TRIGGER FINGER RELEASE  2018  . TRIGGER FINGER RELEASE  2018    There were no vitals filed for this visit.      Subjective Assessment - 10/09/17 0856    Subjective had a golf tournament yesterday; has a little pain this morning in Lt buttock. no pain now.   Patient Stated Goals improve mobility, wants to play golf again (last time 2 weeks ago), get up/down from chairs/golf cart without UE support   Currently in Pain? No/denies                         [facility] Adult PT Treatment/Exercise - 10/09/17 0859      Exercises   Exercises Knee/Hip     Lumbar Exercises: Stretches   Single Knee to Chest Stretch 3 reps;30 seconds   Single Knee to Chest Stretch Limitations bil   Piriformis Stretch 3 reps;30 seconds   Piriformis Stretch Limitations bil; supine; modified figure-4     Lumbar Exercises: Aerobic   Stationary Bike L1 x 6 min     Lumbar Exercises: Supine   Bridge 10 reps;5 seconds   Other Supine Lumbar Exercises LLE isometric hip ext 10 x 5 sec; with towel     Knee/Hip Exercises: Standing   Heel Raises  Both;20 reps   Heel Raises Limitations toe raises x 20 reps   Knee Flexion Both;15 reps   Knee Flexion Limitations 2#   Hip Flexion Both;15 reps;Knee bent   Hip Flexion Limitations 2#   Hip Abduction Both;15 reps;Knee straight   Abduction Limitations 2#   Hip Extension Both;15 reps;Knee straight   Extension Limitations 2#   Wall Squat 5 reps;10 seconds                     PT Long Term Goals - 09/21/17 0921      PT LONG TERM GOAL #1   Title independent with HEP   Status New   Target Date 11/02/17     PT LONG TERM GOAL #2   Title report pain </= 1/10 in the morning for improved pain and function   Status New   Target Date 11/02/17     PT LONG TERM GOAL #3   Title report ability to return to regular golf routine for improved function   Status New   Target Date 11/02/17     PT LONG TERM GOAL #4   Title perform 5x STS in < 18 sec for improved functional strength  Status New   Target Date 11/02/17               Plan - 10/09/17 0943    Clinical Impression Statement Pt tolerated strengthening well today without increase in pain.  Pt also reports vertigo now resolved.  No pain with playing golf and reports some mild pain in the AM which resolves with stretches, hot shower and increased ambulation.  Will continue to benefit from PT to maximize function.   PT Treatment/Interventions ADLs/Self Care Home Management;Cryotherapy;Electrical Stimulation;Moist Heat;Ultrasound;Neuromuscular re-education;Therapeutic exercise;Therapeutic activities;Functional mobility training;Gait training;Stair training;Patient/family education;Manual techniques;Taping;Dry needling;Canalith Repostioning;Vestibular   PT Next Visit Plan review updated HEP, hip/core strengthening; DN to gluts/piriformis/hamstring; assess BPPV PRN   Consulted and Agree with Plan of Care Patient      Patient will benefit from skilled therapeutic intervention in order to improve the following deficits and  impairments:  Postural dysfunction, Impaired flexibility, Decreased mobility, Decreased strength, Increased fascial restricitons, Increased muscle spasms, Pain  Visit Diagnosis: Chronic left-sided low back pain without sciatica  Abnormal posture  Muscle weakness (generalized)  BPPV (benign paroxysmal positional vertigo), right     Problem List Patient Active Problem List   Diagnosis Date Noted  . Muscle weakness 07/17/2017      Laureen Abrahams, PT, DPT 10/09/17 9:49 AM    [facility] [place], [place], [address] Phone: [phone]   Fax:  [phone]  Name: [name] MRN: [id number] Date of Birth: June 11, 1941

## 2017-10-12 ENCOUNTER — Ambulatory Visit (INDEPENDENT_AMBULATORY_CARE_PROVIDER_SITE_OTHER): Payer: PPO | Admitting: Physical Therapy

## 2017-10-12 DIAGNOSIS — G8929 Other chronic pain: Secondary | ICD-10-CM

## 2017-10-12 DIAGNOSIS — M545 Low back pain, unspecified: Secondary | ICD-10-CM

## 2017-10-12 DIAGNOSIS — M6281 Muscle weakness (generalized): Secondary | ICD-10-CM

## 2017-10-12 DIAGNOSIS — R293 Abnormal posture: Secondary | ICD-10-CM | POA: Diagnosis not present

## 2017-10-12 DIAGNOSIS — H8111 Benign paroxysmal vertigo, right ear: Secondary | ICD-10-CM | POA: Diagnosis not present

## 2017-10-12 NOTE — Therapy (Addendum)
[facility] [address] [place], [place], [address] Phone: [phone]   Fax:  [phone]  Physical Therapy Treatment/Discharge  Patient Details  Name: [name] MRN: [id number] Date of Birth: November 05, 1941 Referring Provider: Dr. Floyde Parkins  Encounter Date: 10/12/2017      PT End of Session - 10/12/17 1337    Visit Number 5   Number of Visits 12   Date for PT Re-Evaluation 11/02/17   Authorization Type [employer]   PT Start Time 1259   PT Stop Time 1337   PT Time Calculation (min) 38 min   Activity Tolerance Patient tolerated treatment well   Behavior During Therapy [facility] for tasks assessed/performed      Past Medical History:  Diagnosis Date  . Grave's disease   . Reflux     Past Surgical History:  Procedure Laterality Date  . ABDOMINAL HYSTERECTOMY    . SALIVARY GLAND SURGERY    . TRIGGER FINGER RELEASE  2018  . TRIGGER FINGER RELEASE  2018    There were no vitals filed for this visit.      Subjective Assessment - 10/12/17 1302    Subjective dx with cancer (likely renal cell-early stages and hopefully will be easy tx).  back is feeling great, no real issues.  pain 2/10 in morning.  would be back to normal golf routine but having hand/thumb issues.     Patient Stated Goals improve mobility, wants to play golf again (last time 2 weeks ago), get up/down from chairs/golf cart without UE support   Currently in Pain? No/denies            [facility] PT Assessment - 10/12/17 1330      Transfers   Five time sit to stand comments  16.30 sec without UE support                     [facility] Adult PT Treatment/Exercise - 10/12/17 1306      Lumbar Exercises: Stretches   Passive Hamstring Stretch 1 rep;30 seconds   Passive Hamstring Stretch Limitations bil   Single Knee to Chest Stretch 1 rep;30 seconds   Single Knee to Chest Stretch Limitations bil   Piriformis Stretch 1 rep;30 seconds    Piriformis Stretch Limitations bil; supine; modified figure-4     Lumbar Exercises: Aerobic   Stationary Bike L1 x 6 min     Lumbar Exercises: Supine   Ab Set 10 reps;5 seconds   Clam 10 reps   Clam Limitations alternating; with ab set   Heel Slides 10 reps   Heel Slides Limitations alternating; with ab set   Bent Knee Raise 10 reps   Bent Knee Raise Limitations alternating; with ab set   Bridge 10 reps;5 seconds     Lumbar Exercises: Sidelying   Clam 10 reps   Clam Limitations bil                PT Education - 10/12/17 1337    Education provided Yes   Education Details continue HEP   Person(s) Educated Patient   Methods Explanation   Comprehension Verbalized understanding             PT Long Term Goals - 10/12/17 1337      PT LONG TERM GOAL #1   Title independent with HEP   Status Achieved     PT LONG TERM GOAL #2   Title report pain </= 1/10 in the morning for improved  pain and function   Baseline 10/27/2023: improved but not to goal   Status Not Met     PT LONG TERM GOAL #3   Title report ability to return to regular golf routine for improved function   Status Achieved     PT LONG TERM GOAL #4   Title perform 5x STS in < 18 sec for improved functional strength   Status Achieved               Plan - 2017-10-26 1338    Clinical Impression Statement Pt has met 3/4 LTGs and requests hold from PT at this time as she is doing much better.  Will hold x 30 days and d/c if pt doesn't return.   PT Treatment/Interventions ADLs/Self Care Home Management;Cryotherapy;Electrical Stimulation;Moist Heat;Ultrasound;Neuromuscular re-education;Therapeutic exercise;Therapeutic activities;Functional mobility training;Gait training;Stair training;Patient/family education;Manual techniques;Taping;Dry needling;Canalith Repostioning;Vestibular   PT Next Visit Plan reassess if pt returns, will need recert and new g code   Consulted and Agree with Plan of Care Patient       Patient will benefit from skilled therapeutic intervention in order to improve the following deficits and impairments:  Postural dysfunction, Impaired flexibility, Decreased mobility, Decreased strength, Increased fascial restricitons, Increased muscle spasms, Pain  Visit Diagnosis: Chronic left-sided low back pain without sciatica  Abnormal posture  Muscle weakness (generalized)  BPPV (benign paroxysmal positional vertigo), right       [facility] PT PB G-CODES - October 26, 2017 1339    Functional Assessment Tool Used  clinical judgement   Functional Limitations Mobility: Walking and moving around   Mobility: Walking and Moving Around Goal Status [phone]) At least 1 percent but less than 20 percent impaired, limited or restricted   Mobility: Walking and Moving Around Discharge Status [phone]) At least 1 percent but less than 20 percent impaired, limited or restricted      Problem List Patient Active Problem List   Diagnosis Date Noted  . Muscle weakness 07/17/2017      Laureen Abrahams, PT, DPT 2017-10-26 1:40 PM    [place], [place], [address] Phone: [phone]   Fax:  [phone]  Name: [name] MRN: [id number] Date of Birth: [birth year]      PHYSICAL THERAPY DISCHARGE SUMMARY  Visits from Start of Care: 5  Current functional level related to goals / functional outcomes: See above   Remaining deficits: See above; pt met 3/4 goals   Education / Equipment: HEP  Plan: Patient agrees to discharge.  Patient goals were partially met. Patient is being discharged due to being pleased with the current functional level.  ?????      Laureen Abrahams, PT, DPT 11/16/17 8:48 AM   [facility] [address] [place], [place], [address] Phone: [phone]  Fax: [phone]

## 2017-10-13 DIAGNOSIS — M654 Radial styloid tenosynovitis [de Quervain]: Secondary | ICD-10-CM | POA: Diagnosis not present

## 2017-10-27 DIAGNOSIS — M654 Radial styloid tenosynovitis [de Quervain]: Secondary | ICD-10-CM | POA: Diagnosis not present

## 2017-11-02 DIAGNOSIS — N2889 Other specified disorders of kidney and ureter: Secondary | ICD-10-CM | POA: Diagnosis not present

## 2017-11-02 DIAGNOSIS — K8020 Calculus of gallbladder without cholecystitis without obstruction: Secondary | ICD-10-CM | POA: Diagnosis not present

## 2017-11-04 DIAGNOSIS — N2889 Other specified disorders of kidney and ureter: Secondary | ICD-10-CM | POA: Diagnosis not present

## 2017-11-04 DIAGNOSIS — N281 Cyst of kidney, acquired: Secondary | ICD-10-CM | POA: Diagnosis not present

## 2017-11-20 ENCOUNTER — Encounter: Payer: Self-pay | Admitting: Neurology

## 2017-11-20 ENCOUNTER — Ambulatory Visit (INDEPENDENT_AMBULATORY_CARE_PROVIDER_SITE_OTHER): Payer: PPO | Admitting: Neurology

## 2017-11-20 VITALS — BP 152/86 | HR 63 | Ht 63.0 in | Wt 183.5 lb

## 2017-11-20 DIAGNOSIS — R413 Other amnesia: Secondary | ICD-10-CM

## 2017-11-20 DIAGNOSIS — Z5181 Encounter for therapeutic drug level monitoring: Secondary | ICD-10-CM

## 2017-11-20 DIAGNOSIS — E538 Deficiency of other specified B group vitamins: Secondary | ICD-10-CM | POA: Diagnosis not present

## 2017-11-20 DIAGNOSIS — M6281 Muscle weakness (generalized): Secondary | ICD-10-CM | POA: Diagnosis not present

## 2017-11-20 NOTE — Progress Notes (Signed)
Reason for visit: Memory disturbance, leg weakness  [name] is an 76 y.o. female  History of present illness:  [name] is a 76 year old right-handed white female with a history of bilateral lower extremity weakness.  Workup did not show any abnormalities within the lumbar spine or any abnormalities on EMG and nerve conduction study evaluation.  MRI of the low back however did pick up a renal cell carcinoma, the patient is being treated for this.  The patient was sent through physical therapy, this improved her leg strength significantly, the patient believes that her strength has gotten much better. The patient however has had some episodes of dizziness or vertigo, she has taken meclizine with good improvement, but she does feel somewhat imbalanced at times.  The patient plays golf, but she has difficulty with her balance.  She still has some episodes of dizziness.  She has not had any falls.  She comes in today complaining of some troubles with memory that has progressed over the last year.  The patient has difficulty with remembering names for people and with short-term memory.  She has some mild problems with directions with driving, she may forget appointments on occasion, and she has trouble keeping up with the date.  The patient has no problems with finances or with keeping up with medications.  The patient indicates that her mother lived until age 105 without any memory problems.  The patient comes back today for an evaluation.  Past Medical History:  Diagnosis Date  . Grave's disease   . Reflux     Past Surgical History:  Procedure Laterality Date  . ABDOMINAL HYSTERECTOMY    . SALIVARY GLAND SURGERY    . TRIGGER FINGER RELEASE  2018  . TRIGGER FINGER RELEASE  2018    Family History  Problem Relation Age of Onset  . Mental illness Maternal Grandmother     Social history:  reports that she has quit smoking. she has never used smokeless tobacco. She reports that she  does not drink alcohol or use drugs.    Allergies  Allergen Reactions  . Bactrim Other (See Comments)    Unsure childhood reaction.  . Erythromycin Hives    Broke out in hives  . Nitrofurantoin Monohyd Macro Rash  . Vicodin [Hydrocodone-Acetaminophen] Rash    Medications:  Prior to Admission medications   Medication Sig Start Date End Date Taking? Authorizing Provider  glucosamine-chondroitin 500-400 MG tablet Take 1 tablet by mouth daily. Liquid form   Yes [provider]  meloxicam (MOBIC) 15 MG tablet  06/05/17  Yes [provider]  Multiple Vitamin (MULTIVITAMIN WITH MINERALS) TABS tablet Take 1 tablet by mouth daily.   Yes [provider]  SYNTHROID 200 MCG tablet Take 100 mcg by mouth daily. 02/18/16  Yes [provider]    ROS:  Out of a complete 14 system review of symptoms, the patient complains only of the following symptoms, and all other reviewed systems are negative.  Chills, fatigue Double vision Frequency of urination Muscle cramps Dizziness Depression  Blood pressure (!) 152/86, pulse 63, height 5\' 3"  (1.6 m), weight 183 lb 8 oz (83.2 kg).  Physical Exam  General: The patient is alert and cooperative at the time of the examination.  The patient is moderately obese.  Skin: No significant peripheral edema is noted.   Neurologic Exam  Mental status: The patient is alert and oriented x 3 at the time of the examination. The patient has  apparent normal recent and remote memory, with an apparently normal attention span and concentration ability.  Mini-Mental status examination done today shows a total score 27/30.   Cranial nerves: Facial symmetry is present. Speech is normal, no aphasia or dysarthria is noted. Extraocular movements are full. Visual fields are full.  Motor: The patient has good strength in all 4 extremities.  Sensory examination: Soft touch sensation is symmetric on the face, arms, and  legs.  Coordination: The patient has good finger-nose-finger and heel-to-shin bilaterally.  Gait and station: The patient has a normal gait. Tandem gait is slightly unsteady. Romberg is negative. No drift is seen.  Reflexes: Deep tendon reflexes are symmetric.   Assessment/Plan:  1.  Reported memory disturbance  2.  Lower extremity weakness, resolved  2.  Episodic dizziness  The patient will undergo some blood work today, she will be set up for MRI evaluation of the brain with and without gadolinium enhancement.  The patient will be considered for medications for memory in the future.  We will follow the memory over time.  She will follow-up in 6 months.  Jill Alexanders MD 11/20/2017 10:09 AM  [facility] [address] [place], [place] [address]  Phone [phone] Fax [phone]

## 2017-11-20 NOTE — Patient Instructions (Signed)
   We will get MRI brain and get blood work today.

## 2017-11-21 LAB — COMPREHENSIVE METABOLIC PANEL
ALT: 21 IU/L (ref 0–32)
AST: 29 IU/L (ref 0–40)
Albumin/Globulin Ratio: 1.9 (ref 1.2–2.2)
Albumin: 4.3 g/dL (ref 3.5–4.8)
Alkaline Phosphatase: 73 IU/L (ref 39–117)
BUN/Creatinine Ratio: 15 (ref 12–28)
BUN: 15 mg/dL (ref 8–27)
Bilirubin Total: 0.3 mg/dL (ref 0.0–1.2)
CO2: 27 mmol/L (ref 20–29)
CREATININE: 1 mg/dL (ref 0.57–1.00)
Calcium: 9.8 mg/dL (ref 8.7–10.3)
Chloride: 102 mmol/L (ref 96–106)
GFR calc non Af Amer: 56 mL/min/{1.73_m2} — ABNORMAL LOW (ref >59)
GFR, EST AFRICAN AMERICAN: 64 mL/min/{1.73_m2} (ref >59)
GLOBULIN, TOTAL: 2.3 g/dL (ref 1.5–4.5)
Glucose: 69 mg/dL (ref 65–99)
Potassium: 4.9 mmol/L (ref 3.5–5.2)
Sodium: 143 mmol/L (ref 134–144)
TOTAL PROTEIN: 6.6 g/dL (ref 6.0–8.5)

## 2017-11-21 LAB — VITAMIN B12: Vitamin B-12: 846 pg/mL (ref 232–1245)

## 2017-11-21 LAB — SEDIMENTATION RATE: Sed Rate: 8 mm/hr (ref 0–40)

## 2017-11-21 LAB — RPR: RPR Ser Ql: NONREACTIVE

## 2017-11-23 ENCOUNTER — Telehealth: Payer: Self-pay | Admitting: *Deleted

## 2017-11-23 NOTE — Telephone Encounter (Signed)
-----   Message from Kathrynn Ducking, MD sent at 11/21/2017  2:15 PM EST -----  The blood work results are unremarkable. Please call the patient.  ----- Message ----- From: Lavone Neri Lab Results In Sent: 11/21/2017   5:41 AM To: Kathrynn Ducking, MD

## 2017-11-23 NOTE — Telephone Encounter (Signed)
Called and spoke with patient about unremarkable labs per CW,MD note. Advised order for MRI brain sent to GI. She should be getting a call soon to schedule. She verbalized understanding.

## 2017-11-25 DIAGNOSIS — Z6833 Body mass index (BMI) 33.0-33.9, adult: Secondary | ICD-10-CM | POA: Diagnosis not present

## 2017-11-25 DIAGNOSIS — Z01419 Encounter for gynecological examination (general) (routine) without abnormal findings: Secondary | ICD-10-CM | POA: Diagnosis not present

## 2017-11-25 DIAGNOSIS — N390 Urinary tract infection, site not specified: Secondary | ICD-10-CM | POA: Diagnosis not present

## 2017-11-25 DIAGNOSIS — Z1231 Encounter for screening mammogram for malignant neoplasm of breast: Secondary | ICD-10-CM | POA: Diagnosis not present

## 2017-11-26 DIAGNOSIS — M654 Radial styloid tenosynovitis [de Quervain]: Secondary | ICD-10-CM | POA: Diagnosis not present

## 2017-12-06 ENCOUNTER — Ambulatory Visit
Admission: RE | Admit: 2017-12-06 | Discharge: 2017-12-06 | Disposition: A | Discharge status: Home / Self Care | Payer: PPO | Source: Ambulatory Visit | Attending: Neurology | Admitting: Neurology

## 2017-12-06 DIAGNOSIS — R413 Other amnesia: Secondary | ICD-10-CM | POA: Diagnosis not present

## 2017-12-06 MED ORDER — GADOBENATE DIMEGLUMINE 529 MG/ML IV SOLN
17.0000 mL | Freq: Once | INTRAVENOUS | Status: AC | PRN
  Administered 2017-12-06: 17 mL via INTRAVENOUS

## 2017-12-07 ENCOUNTER — Telehealth: Payer: Self-pay | Admitting: Neurology

## 2017-12-07 NOTE — Telephone Encounter (Signed)
MRI of the brain was done, appears to be relatively unremarkable.  If the patient wishes to go on medications for memory, she is to contact our office.   MRI brain 12/06/17:  IMPRESSION: Unremarkable MRI scan of the brain showing mild age-appropriate changes of chronic microvascular ischemia.  Incidental mild changes of chronic paranasal sinus inflammation are noted.

## 2018-01-06 DIAGNOSIS — R69 Illness, unspecified: Secondary | ICD-10-CM | POA: Diagnosis not present

## 2018-01-18 ENCOUNTER — Other Ambulatory Visit: Payer: Self-pay | Admitting: Urology

## 2018-01-18 DIAGNOSIS — N2889 Other specified disorders of kidney and ureter: Secondary | ICD-10-CM

## 2018-02-03 ENCOUNTER — Ambulatory Visit
Admission: RE | Admit: 2018-02-03 | Discharge: 2018-02-03 | Disposition: A | Discharge status: Home / Self Care | Payer: Medicare HMO | Source: Ambulatory Visit | Attending: Urology | Admitting: Urology

## 2018-02-03 DIAGNOSIS — N2889 Other specified disorders of kidney and ureter: Secondary | ICD-10-CM

## 2018-02-03 DIAGNOSIS — K8020 Calculus of gallbladder without cholecystitis without obstruction: Secondary | ICD-10-CM | POA: Diagnosis not present

## 2018-02-03 DIAGNOSIS — D225 Melanocytic nevi of trunk: Secondary | ICD-10-CM | POA: Diagnosis not present

## 2018-02-03 DIAGNOSIS — L821 Other seborrheic keratosis: Secondary | ICD-10-CM | POA: Diagnosis not present

## 2018-02-03 DIAGNOSIS — D1801 Hemangioma of skin and subcutaneous tissue: Secondary | ICD-10-CM | POA: Diagnosis not present

## 2018-02-03 DIAGNOSIS — D2261 Melanocytic nevi of right upper limb, including shoulder: Secondary | ICD-10-CM | POA: Diagnosis not present

## 2018-02-03 DIAGNOSIS — L814 Other melanin hyperpigmentation: Secondary | ICD-10-CM | POA: Diagnosis not present

## 2018-02-03 DIAGNOSIS — L308 Other specified dermatitis: Secondary | ICD-10-CM | POA: Diagnosis not present

## 2018-02-03 MED ORDER — IOPAMIDOL (ISOVUE-300) INJECTION 61%
100.0000 mL | Freq: Once | INTRAVENOUS | Status: AC | PRN
  Administered 2018-02-03: 100 mL via INTRAVENOUS

## 2018-02-08 DIAGNOSIS — R69 Illness, unspecified: Secondary | ICD-10-CM | POA: Diagnosis not present

## 2018-02-10 DIAGNOSIS — N2889 Other specified disorders of kidney and ureter: Secondary | ICD-10-CM | POA: Diagnosis not present

## 2018-02-23 DIAGNOSIS — E034 Atrophy of thyroid (acquired): Secondary | ICD-10-CM | POA: Diagnosis not present

## 2018-02-23 DIAGNOSIS — I2510 Atherosclerotic heart disease of native coronary artery without angina pectoris: Secondary | ICD-10-CM | POA: Diagnosis not present

## 2018-02-23 DIAGNOSIS — N390 Urinary tract infection, site not specified: Secondary | ICD-10-CM | POA: Diagnosis not present

## 2018-02-23 DIAGNOSIS — N2889 Other specified disorders of kidney and ureter: Secondary | ICD-10-CM | POA: Diagnosis not present

## 2018-02-23 DIAGNOSIS — M150 Primary generalized (osteo)arthritis: Secondary | ICD-10-CM | POA: Diagnosis not present

## 2018-02-23 DIAGNOSIS — R5383 Other fatigue: Secondary | ICD-10-CM | POA: Diagnosis not present

## 2018-02-23 DIAGNOSIS — Z23 Encounter for immunization: Secondary | ICD-10-CM | POA: Diagnosis not present

## 2018-02-23 DIAGNOSIS — Z0000 Encounter for general adult medical examination without abnormal findings: Secondary | ICD-10-CM | POA: Diagnosis not present

## 2018-03-03 DIAGNOSIS — E039 Hypothyroidism, unspecified: Secondary | ICD-10-CM | POA: Diagnosis not present

## 2018-03-03 DIAGNOSIS — E782 Mixed hyperlipidemia: Secondary | ICD-10-CM | POA: Diagnosis not present

## 2018-03-03 DIAGNOSIS — N2889 Other specified disorders of kidney and ureter: Secondary | ICD-10-CM | POA: Diagnosis not present

## 2018-03-03 DIAGNOSIS — I2510 Atherosclerotic heart disease of native coronary artery without angina pectoris: Secondary | ICD-10-CM | POA: Diagnosis not present

## 2018-03-18 DIAGNOSIS — Z87891 Personal history of nicotine dependence: Secondary | ICD-10-CM | POA: Diagnosis not present

## 2018-03-18 DIAGNOSIS — E785 Hyperlipidemia, unspecified: Secondary | ICD-10-CM | POA: Diagnosis not present

## 2018-03-18 DIAGNOSIS — E669 Obesity, unspecified: Secondary | ICD-10-CM | POA: Diagnosis not present

## 2018-03-18 DIAGNOSIS — H547 Unspecified visual loss: Secondary | ICD-10-CM | POA: Diagnosis not present

## 2018-03-18 DIAGNOSIS — Z880 Allergy status to penicillin: Secondary | ICD-10-CM | POA: Diagnosis not present

## 2018-03-18 DIAGNOSIS — Z809 Family history of malignant neoplasm, unspecified: Secondary | ICD-10-CM | POA: Diagnosis not present

## 2018-03-18 DIAGNOSIS — E039 Hypothyroidism, unspecified: Secondary | ICD-10-CM | POA: Diagnosis not present

## 2018-03-18 DIAGNOSIS — Z6834 Body mass index (BMI) 34.0-34.9, adult: Secondary | ICD-10-CM | POA: Diagnosis not present

## 2018-03-18 DIAGNOSIS — Z87892 Personal history of anaphylaxis: Secondary | ICD-10-CM | POA: Diagnosis not present

## 2018-03-18 DIAGNOSIS — C649 Malignant neoplasm of unspecified kidney, except renal pelvis: Secondary | ICD-10-CM | POA: Diagnosis not present

## 2018-04-06 DIAGNOSIS — I2510 Atherosclerotic heart disease of native coronary artery without angina pectoris: Secondary | ICD-10-CM | POA: Diagnosis not present

## 2018-04-06 DIAGNOSIS — E039 Hypothyroidism, unspecified: Secondary | ICD-10-CM | POA: Diagnosis not present

## 2018-04-06 DIAGNOSIS — R5383 Other fatigue: Secondary | ICD-10-CM | POA: Diagnosis not present

## 2018-04-14 DIAGNOSIS — N2889 Other specified disorders of kidney and ureter: Secondary | ICD-10-CM | POA: Diagnosis not present

## 2018-04-14 DIAGNOSIS — E039 Hypothyroidism, unspecified: Secondary | ICD-10-CM | POA: Diagnosis not present

## 2018-04-14 DIAGNOSIS — I2510 Atherosclerotic heart disease of native coronary artery without angina pectoris: Secondary | ICD-10-CM | POA: Diagnosis not present

## 2018-04-14 DIAGNOSIS — R531 Weakness: Secondary | ICD-10-CM | POA: Diagnosis not present

## 2018-05-24 ENCOUNTER — Ambulatory Visit: Payer: PPO | Admitting: Adult Health

## 2018-06-04 DIAGNOSIS — Z961 Presence of intraocular lens: Secondary | ICD-10-CM | POA: Diagnosis not present

## 2018-06-04 DIAGNOSIS — H52203 Unspecified astigmatism, bilateral: Secondary | ICD-10-CM | POA: Diagnosis not present

## 2018-06-04 DIAGNOSIS — H43813 Vitreous degeneration, bilateral: Secondary | ICD-10-CM | POA: Diagnosis not present

## 2018-06-04 DIAGNOSIS — H532 Diplopia: Secondary | ICD-10-CM | POA: Diagnosis not present

## 2018-07-20 ENCOUNTER — Other Ambulatory Visit: Payer: Self-pay | Admitting: Urology

## 2018-07-20 DIAGNOSIS — N2889 Other specified disorders of kidney and ureter: Secondary | ICD-10-CM

## 2018-08-02 ENCOUNTER — Ambulatory Visit
Admission: RE | Admit: 2018-08-02 | Discharge: 2018-08-02 | Disposition: A | Discharge status: Home / Self Care | Payer: Medicare HMO | Source: Ambulatory Visit | Attending: Urology | Admitting: Urology

## 2018-08-02 DIAGNOSIS — N2889 Other specified disorders of kidney and ureter: Secondary | ICD-10-CM

## 2018-08-02 DIAGNOSIS — N200 Calculus of kidney: Secondary | ICD-10-CM | POA: Diagnosis not present

## 2018-08-02 MED ORDER — IOPAMIDOL (ISOVUE-300) INJECTION 61%
100.0000 mL | Freq: Once | INTRAVENOUS | Status: AC | PRN
  Administered 2018-08-02: 100 mL via INTRAVENOUS

## 2018-08-11 DIAGNOSIS — N2889 Other specified disorders of kidney and ureter: Secondary | ICD-10-CM | POA: Diagnosis not present

## 2018-09-02 DIAGNOSIS — R531 Weakness: Secondary | ICD-10-CM | POA: Diagnosis not present

## 2018-09-02 DIAGNOSIS — I2510 Atherosclerotic heart disease of native coronary artery without angina pectoris: Secondary | ICD-10-CM | POA: Diagnosis not present

## 2018-09-02 DIAGNOSIS — E782 Mixed hyperlipidemia: Secondary | ICD-10-CM | POA: Diagnosis not present

## 2018-09-07 ENCOUNTER — Encounter: Payer: Self-pay | Admitting: Adult Health

## 2018-09-07 ENCOUNTER — Encounter

## 2018-09-07 ENCOUNTER — Ambulatory Visit: Payer: Medicare HMO | Admitting: Adult Health

## 2018-09-07 VITALS — BP 128/68 | HR 66 | Ht 63.0 in | Wt 179.0 lb

## 2018-09-07 DIAGNOSIS — R413 Other amnesia: Secondary | ICD-10-CM | POA: Diagnosis not present

## 2018-09-07 DIAGNOSIS — M6281 Muscle weakness (generalized): Secondary | ICD-10-CM | POA: Diagnosis not present

## 2018-09-07 NOTE — Patient Instructions (Signed)
Your Plan:  Continue to monitor memory. May consider adding medication  Referral to physical therapy  If your symptoms worsen or you develop new symptoms please let us know.   Thank you for coming to see [place] at [facility]. I hope we have been able to provide you high quality care today.  You may receive a patient satisfaction survey over the next few weeks. We would appreciate your feedback and comments so that we may continue to improve ourselves and the health of our patients.

## 2018-09-07 NOTE — Progress Notes (Signed)
I have read the note, and I agree with the clinical assessment and plan.  Charles K Willis   

## 2018-09-07 NOTE — Progress Notes (Signed)
PATIENT: JANUARY BERGTHOLD DOB: 09-01-1941  REASON FOR VISIT: follow up HISTORY FROM: patient  HISTORY OF PRESENT ILLNESS: Today 09/07/18:  Ms. Jeneen Rinks is a 77 year old female with a history of lower extremity weakness and memory disturbance.  She returns today for follow-up.  She feels that her memory has remained stable.  She was at home with her husband.  She is able to complete all ADLs independently.  She operates a Teacher, music without difficulty.  Reports good appetite.  She reports that sometimes her sleep is disrupted because her legs are bothering her.  She states that she did get good benefit from physical therapy.  She continues to do the exercises that they taught her however she does not do them 2 or 3 times a day as they instructed.  She states that she has the most trouble going from a sitting to a standing position.  She states when she is walking she has no difficulty.  Reports that she walks approximately 2 miles a day.  She was diagnosed with renal cell carcinoma however right now they are monitoring as she has had no growth.  She returns today for evaluation.  HISTORY Ms. Kopecky is a 77 year old right-handed white female with a history of bilateral lower extremity weakness.  Workup did not show any abnormalities within the lumbar spine or any abnormalities on EMG and nerve conduction study evaluation.  MRI of the low back however did pick up a renal cell carcinoma, the patient is being treated for this.  The patient was sent through physical therapy, this improved her leg strength significantly, the patient believes that her strength has gotten much better. The patient however has had some episodes of dizziness or vertigo, she has taken meclizine with good improvement, but she does feel somewhat imbalanced at times.  The patient plays golf, but she has difficulty with her balance.  She still has some episodes of dizziness.  She has not had any falls.  She comes in today  complaining of some troubles with memory that has progressed over the last year.  The patient has difficulty with remembering names for people and with short-term memory.  She has some mild problems with directions with driving, she may forget appointments on occasion, and she has trouble keeping up with the date.  The patient has no problems with finances or with keeping up with medications.  The patient indicates that her mother lived until age 20 without any memory problems.  The patient comes back today for an evaluation.   REVIEW OF SYSTEMS: Out of a complete 14 system review of symptoms, the patient complains only of the following symptoms, and all other reviewed systems are negative.  See HPI  ALLERGIES: Allergies  Allergen Reactions  . Bactrim Other (See Comments)    Unsure childhood reaction.  . Erythromycin Hives    Broke out in hives  . Nitrofurantoin Monohyd Macro Rash  . Vicodin [Hydrocodone-Acetaminophen] Rash    HOME MEDICATIONS: Outpatient Medications Prior to Visit  Medication Sig Dispense Refill  . glucosamine-chondroitin 500-400 MG tablet Take 1 tablet by mouth daily. Liquid form    . lovastatin (MEVACOR) 20 MG tablet TAKE 1 TABLET BY MOUTH ONCE DAILY WITH A MEAL  6  . Multiple Vitamin (MULTIVITAMIN WITH MINERALS) TABS tablet Take 1 tablet by mouth daily.    Marland Kitchen SYNTHROID 125 MCG tablet 125 mcg daily.    . meloxicam (MOBIC) 15 MG tablet     . SYNTHROID 200 MCG  tablet Take 100 mcg by mouth daily.     No facility-administered medications prior to visit.     PAST MEDICAL HISTORY: Past Medical History:  Diagnosis Date  . Cancer [facility])    kidney, seeing dr in [place]  . Grave's disease   . Reflux     PAST SURGICAL HISTORY: Past Surgical History:  Procedure Laterality Date  . ABDOMINAL HYSTERECTOMY    . SALIVARY GLAND SURGERY    . TRIGGER FINGER RELEASE  2018  . TRIGGER FINGER RELEASE  2018    FAMILY HISTORY: Family History  Problem Relation Age of  Onset  . Mental illness Maternal Grandmother     SOCIAL HISTORY: Social History   Socioeconomic History  . Marital status: Married    Spouse name: Lyndle Herrlich  . Number of children: 2  . Years of education: Dental assitant  . Highest education level: Not on file  Occupational History  . Not on file  Social Needs  . Financial resource strain: Not on file  . Food insecurity:    Worry: Not on file    Inability: Not on file  . Transportation needs:    Medical: Not on file    Non-medical: Not on file  Tobacco Use  . Smoking status: Former Research scientist (life sciences)  . Smokeless tobacco: Never Used  Substance and Sexual Activity  . Alcohol use: No  . Drug use: No  . Sexual activity: Not on file  Lifestyle  . Physical activity:    Days per week: Not on file    Minutes per session: Not on file  . Stress: Not on file  Relationships  . Social connections:    Talks on phone: Not on file    Gets together: Not on file    Attends religious service: Not on file    Active member of club or organization: Not on file    Attends meetings of clubs or organizations: Not on file    Relationship status: Not on file  . Intimate partner violence:    Fear of current or ex partner: Not on file    Emotionally abused: Not on file    Physically abused: Not on file    Forced sexual activity: Not on file  Other Topics Concern  . Not on file  Social History Narrative   Lives with husband, Lyndle Herrlich   Caffeine use: Drinks coffee daily   Right handed      PHYSICAL EXAM  Vitals:   09/07/18 0913  BP: 128/68  Pulse: 66  Weight: 179 lb (81.2 kg)  Height: 5\' 3"  (1.6 m)   Body mass index is 31.71 kg/m.   MMSE - Mini Mental State Exam 09/07/2018 11/20/2017  Orientation to time 4 4  Orientation to Place 5 5  Registration 3 3  Attention/ Calculation 0 5  Recall 2 1  Language- name 2 objects 2 2  Language- repeat 1 1  Language- follow 3 step command 3 3  Language- read & follow direction 1 1  Write a sentence 1  1  Copy design 1 1  Total score 23 27     Generalized: Well developed, in no acute distress   Neurological examination  Mentation: Alert oriented to time, place, history taking. Follows all commands speech and language fluent Cranial nerve II-XII: Pupils were equal round reactive to light. Extraocular movements were full, visual field were full on confrontational test. Facial sensation and strength were normal. Uvula tongue midline. Head turning and shoulder shrug  were  normal and symmetric. Motor: The motor testing reveals 5 over 5 strength of all 4 extremities. Good symmetric motor tone is noted throughout.  Sensory: Sensory testing is intact to soft touch on all 4 extremities. No evidence of extinction is noted.  Coordination: Cerebellar testing reveals good finger-nose-finger and heel-to-shin bilaterally.  Gait and station: Gait is normal.  Reflexes: Deep tendon reflexes are symmetric and normal bilaterally.   DIAGNOSTIC DATA (LABS, IMAGING, TESTING) - I reviewed patient records, labs, notes, testing and imaging myself where available.  Lab Results  Component Value Date   WBC 11.8 (H) 11/17/2007   HGB 11.0 DELTA CHECK NOTED (L) 11/17/2007   HCT 31.7 (L) 11/17/2007   MCV 96.5 11/17/2007   PLT 296 11/17/2007      Component Value Date/Time   NA 143 11/20/2017 1046   K 4.9 11/20/2017 1046   CL 102 11/20/2017 1046   CO2 27 11/20/2017 1046   GLUCOSE 69 11/20/2017 1046   BUN 15 11/20/2017 1046   CREATININE 1.00 11/20/2017 1046   CALCIUM 9.8 11/20/2017 1046   PROT 6.6 11/20/2017 1046   ALBUMIN 4.3 11/20/2017 1046   AST 29 11/20/2017 1046   ALT 21 11/20/2017 1046   ALKPHOS 73 11/20/2017 1046   BILITOT 0.3 11/20/2017 1046   GFRNONAA 56 (L) 11/20/2017 1046   GFRAA 64 11/20/2017 1046   No results found for: CHOL, HDL, LDLCALC, LDLDIRECT, TRIG, CHOLHDL No results found for: HGBA1C Lab Results  Component Value Date   VITAMINB12 846 11/20/2017   No results found for:  TSH    ASSESSMENT AND PLAN 77 y.o. year old female  has a past medical history of Cancer ([place]), Grave's disease, and Reflux. here with :  1.  Memory disturbance 2.  Muscle weakness  The patient's memory score has declined slightly.  We discussed adding on medication however she deferred for now.  She continues to have muscle weakness with the most trouble going from a sitting to a standing position.  I will refer for physical therapy again.  She is advised that if her symptoms worsen or she develops new symptoms she should let us know.  She will follow-up in 6 months or sooner if needed.    Ward Givens, MSN, NP-C 09/07/2018, 9:28 AM [facility] [address] [place], [place] [address] [phone]

## 2018-09-08 DIAGNOSIS — I2510 Atherosclerotic heart disease of native coronary artery without angina pectoris: Secondary | ICD-10-CM | POA: Diagnosis not present

## 2018-09-08 DIAGNOSIS — R002 Palpitations: Secondary | ICD-10-CM | POA: Diagnosis not present

## 2018-09-08 DIAGNOSIS — N2889 Other specified disorders of kidney and ureter: Secondary | ICD-10-CM | POA: Diagnosis not present

## 2018-09-08 DIAGNOSIS — R0602 Shortness of breath: Secondary | ICD-10-CM | POA: Diagnosis not present

## 2018-09-14 DIAGNOSIS — E782 Mixed hyperlipidemia: Secondary | ICD-10-CM | POA: Diagnosis not present

## 2018-09-14 DIAGNOSIS — R5383 Other fatigue: Secondary | ICD-10-CM | POA: Diagnosis not present

## 2018-09-29 DIAGNOSIS — Z0189 Encounter for other specified special examinations: Secondary | ICD-10-CM | POA: Diagnosis not present

## 2018-09-29 DIAGNOSIS — R0609 Other forms of dyspnea: Secondary | ICD-10-CM | POA: Diagnosis not present

## 2018-09-29 DIAGNOSIS — I872 Venous insufficiency (chronic) (peripheral): Secondary | ICD-10-CM | POA: Diagnosis not present

## 2018-10-04 DIAGNOSIS — R69 Illness, unspecified: Secondary | ICD-10-CM | POA: Diagnosis not present

## 2018-10-18 DIAGNOSIS — R0602 Shortness of breath: Secondary | ICD-10-CM | POA: Diagnosis not present

## 2018-10-26 DIAGNOSIS — E039 Hypothyroidism, unspecified: Secondary | ICD-10-CM | POA: Diagnosis not present

## 2018-10-28 DIAGNOSIS — R0602 Shortness of breath: Secondary | ICD-10-CM | POA: Diagnosis not present

## 2018-11-01 DIAGNOSIS — I2510 Atherosclerotic heart disease of native coronary artery without angina pectoris: Secondary | ICD-10-CM | POA: Diagnosis not present

## 2018-11-01 DIAGNOSIS — M150 Primary generalized (osteo)arthritis: Secondary | ICD-10-CM | POA: Diagnosis not present

## 2018-11-01 DIAGNOSIS — E039 Hypothyroidism, unspecified: Secondary | ICD-10-CM | POA: Diagnosis not present

## 2018-11-01 DIAGNOSIS — N2889 Other specified disorders of kidney and ureter: Secondary | ICD-10-CM | POA: Diagnosis not present

## 2018-11-03 ENCOUNTER — Other Ambulatory Visit: Payer: Self-pay | Admitting: Internal Medicine

## 2018-11-03 DIAGNOSIS — E039 Hypothyroidism, unspecified: Secondary | ICD-10-CM

## 2018-11-04 DIAGNOSIS — R0609 Other forms of dyspnea: Secondary | ICD-10-CM | POA: Diagnosis not present

## 2018-11-04 DIAGNOSIS — I10 Essential (primary) hypertension: Secondary | ICD-10-CM | POA: Diagnosis not present

## 2018-11-05 ENCOUNTER — Ambulatory Visit
Admission: RE | Admit: 2018-11-05 | Discharge: 2018-11-05 | Disposition: A | Discharge status: Home / Self Care | Payer: Medicare HMO | Source: Ambulatory Visit | Attending: Internal Medicine | Admitting: Internal Medicine

## 2018-11-05 DIAGNOSIS — E039 Hypothyroidism, unspecified: Secondary | ICD-10-CM | POA: Diagnosis not present

## 2018-11-11 DIAGNOSIS — R0609 Other forms of dyspnea: Secondary | ICD-10-CM | POA: Diagnosis not present

## 2018-11-22 DIAGNOSIS — I10 Essential (primary) hypertension: Secondary | ICD-10-CM | POA: Diagnosis not present

## 2018-12-06 DIAGNOSIS — Z7689 Persons encountering health services in other specified circumstances: Secondary | ICD-10-CM | POA: Diagnosis not present

## 2019-01-03 DIAGNOSIS — Z1231 Encounter for screening mammogram for malignant neoplasm of breast: Secondary | ICD-10-CM | POA: Diagnosis not present

## 2019-01-03 DIAGNOSIS — Z6832 Body mass index (BMI) 32.0-32.9, adult: Secondary | ICD-10-CM | POA: Diagnosis not present

## 2019-01-03 DIAGNOSIS — Z124 Encounter for screening for malignant neoplasm of cervix: Secondary | ICD-10-CM | POA: Diagnosis not present

## 2019-01-05 ENCOUNTER — Other Ambulatory Visit: Payer: Self-pay | Admitting: Urology

## 2019-01-05 DIAGNOSIS — N2889 Other specified disorders of kidney and ureter: Secondary | ICD-10-CM

## 2019-01-17 DIAGNOSIS — E039 Hypothyroidism, unspecified: Secondary | ICD-10-CM | POA: Diagnosis not present

## 2019-01-17 DIAGNOSIS — N2889 Other specified disorders of kidney and ureter: Secondary | ICD-10-CM | POA: Diagnosis not present

## 2019-01-17 DIAGNOSIS — I2510 Atherosclerotic heart disease of native coronary artery without angina pectoris: Secondary | ICD-10-CM | POA: Diagnosis not present

## 2019-01-24 DIAGNOSIS — R5383 Other fatigue: Secondary | ICD-10-CM | POA: Diagnosis not present

## 2019-01-24 DIAGNOSIS — N2889 Other specified disorders of kidney and ureter: Secondary | ICD-10-CM | POA: Diagnosis not present

## 2019-01-24 DIAGNOSIS — I2510 Atherosclerotic heart disease of native coronary artery without angina pectoris: Secondary | ICD-10-CM | POA: Diagnosis not present

## 2019-01-24 DIAGNOSIS — E039 Hypothyroidism, unspecified: Secondary | ICD-10-CM | POA: Diagnosis not present

## 2019-02-02 ENCOUNTER — Ambulatory Visit
Admission: RE | Admit: 2019-02-02 | Discharge: 2019-02-02 | Disposition: A | Discharge status: Home / Self Care | Payer: Medicare HMO | Source: Ambulatory Visit | Attending: Urology | Admitting: Urology

## 2019-02-02 DIAGNOSIS — C641 Malignant neoplasm of right kidney, except renal pelvis: Secondary | ICD-10-CM | POA: Diagnosis not present

## 2019-02-02 DIAGNOSIS — N2889 Other specified disorders of kidney and ureter: Secondary | ICD-10-CM

## 2019-02-09 DIAGNOSIS — N2889 Other specified disorders of kidney and ureter: Secondary | ICD-10-CM | POA: Diagnosis not present

## 2019-03-18 DIAGNOSIS — L720 Epidermal cyst: Secondary | ICD-10-CM | POA: Diagnosis not present

## 2019-03-18 DIAGNOSIS — L814 Other melanin hyperpigmentation: Secondary | ICD-10-CM | POA: Diagnosis not present

## 2019-03-18 DIAGNOSIS — L723 Sebaceous cyst: Secondary | ICD-10-CM | POA: Diagnosis not present

## 2019-03-18 DIAGNOSIS — L821 Other seborrheic keratosis: Secondary | ICD-10-CM | POA: Diagnosis not present

## 2019-03-28 ENCOUNTER — Ambulatory Visit: Payer: Medicare HMO | Admitting: Neurology

## 2019-03-31 DIAGNOSIS — E039 Hypothyroidism, unspecified: Secondary | ICD-10-CM | POA: Diagnosis not present

## 2019-03-31 DIAGNOSIS — M150 Primary generalized (osteo)arthritis: Secondary | ICD-10-CM | POA: Diagnosis not present

## 2019-03-31 DIAGNOSIS — R5383 Other fatigue: Secondary | ICD-10-CM | POA: Diagnosis not present

## 2019-03-31 DIAGNOSIS — I2510 Atherosclerotic heart disease of native coronary artery without angina pectoris: Secondary | ICD-10-CM | POA: Diagnosis not present

## 2019-03-31 DIAGNOSIS — E782 Mixed hyperlipidemia: Secondary | ICD-10-CM | POA: Diagnosis not present

## 2019-03-31 DIAGNOSIS — Z0000 Encounter for general adult medical examination without abnormal findings: Secondary | ICD-10-CM | POA: Diagnosis not present

## 2019-03-31 DIAGNOSIS — Z23 Encounter for immunization: Secondary | ICD-10-CM | POA: Diagnosis not present

## 2019-03-31 DIAGNOSIS — N2889 Other specified disorders of kidney and ureter: Secondary | ICD-10-CM | POA: Diagnosis not present

## 2019-05-02 ENCOUNTER — Encounter: Payer: Self-pay | Admitting: Cardiology

## 2019-05-02 ENCOUNTER — Other Ambulatory Visit: Payer: Self-pay

## 2019-05-02 ENCOUNTER — Ambulatory Visit (INDEPENDENT_AMBULATORY_CARE_PROVIDER_SITE_OTHER): Payer: Medicare HMO | Admitting: Cardiology

## 2019-05-02 VITALS — BP 134/79 | HR 68 | Ht 62.0 in | Wt 175.0 lb

## 2019-05-02 DIAGNOSIS — R0609 Other forms of dyspnea: Secondary | ICD-10-CM | POA: Diagnosis not present

## 2019-05-02 DIAGNOSIS — R0600 Dyspnea, unspecified: Secondary | ICD-10-CM | POA: Insufficient documentation

## 2019-05-02 DIAGNOSIS — I10 Essential (primary) hypertension: Secondary | ICD-10-CM | POA: Diagnosis not present

## 2019-05-02 NOTE — Progress Notes (Signed)
Virtual Visit via Video Note   Subjective:   [name], female    DOB: 1941-03-19, 78 y.o.   MRN: [id number]   I connected with the patient on 05/02/19 by a video enabled telemedicine application and verified that I am speaking with the correct person using two identifiers.     I discussed the limitations of evaluation and management by telemedicine and the availability of in person appointments. The patient expressed understanding and agreed to proceed.   This visit type was conducted due to national recommendations for restrictions regarding the COVID-19 Pandemic (e.g. social distancing).  This format is felt to be most appropriate for this patient at this time.  All issues noted in this document were discussed and addressed.  No physical exam was performed (except for noted visual exam findings with Tele health visits).  The patient has consented to conduct a Tele health visit and understands insurance will be billed.     Chief complaint:  Exertional dyspnea   HPI  78 year old Caucasian female with hypertension, stable renal cell carcinoma, arthritis, Graves' disease status post ablation, seen for exertional dyspnea  Exercise treadmill stress test negative for iscehmia, with low evercise capacity. Echocardiogram realtively unremarkable. I felt her dyspnea was due to uncontrolled hypertension and deconditioning.   There is no significant change, better or worse, in her exertional dyspnea symptoms. She is able to walk up to 1-1.5 miles without any significant difficulty. Blood pressure is well controlled.   Past Medical History:  Diagnosis Date  . Cancer [facility])    kidney, seeing dr in [place]  . Grave's disease   . Reflux      Past Surgical History:  Procedure Laterality Date  . ABDOMINAL HYSTERECTOMY    . SALIVARY GLAND SURGERY    . TRIGGER FINGER RELEASE  2018  . TRIGGER FINGER RELEASE  2018     Social History   Socioeconomic History  . Marital status:  Married    Spouse name: Lyndle Herrlich  . Number of children: 2  . Years of education: Dental assitant  . Highest education level: Not on file  Occupational History  . Not on file  Social Needs  . Financial resource strain: Not on file  . Food insecurity:    Worry: Not on file    Inability: Not on file  . Transportation needs:    Medical: Not on file    Non-medical: Not on file  Tobacco Use  . Smoking status: Former Research scientist (life sciences)  . Smokeless tobacco: Never Used  Substance and Sexual Activity  . Alcohol use: No  . Drug use: No  . Sexual activity: Not on file  Lifestyle  . Physical activity:    Days per week: Not on file    Minutes per session: Not on file  . Stress: Not on file  Relationships  . Social connections:    Talks on phone: Not on file    Gets together: Not on file    Attends religious service: Not on file    Active member of club or organization: Not on file    Attends meetings of clubs or organizations: Not on file    Relationship status: Not on file  . Intimate partner violence:    Fear of current or ex partner: Not on file    Emotionally abused: Not on file    Physically abused: Not on file    Forced sexual activity: Not on file  Other Topics Concern  . Not on file  Social History Narrative   Lives with husband, Vinny   Caffeine use: Drinks coffee daily   Right handed     Family History  Problem Relation Age of Onset  . Mental illness Maternal Grandmother      Current Outpatient Medications on File Prior to Visit  Medication Sig Dispense Refill  . glucosamine-chondroitin 500-400 MG tablet Take 1 tablet by mouth daily. Liquid form    . lovastatin (MEVACOR) 20 MG tablet TAKE 1 TABLET BY MOUTH ONCE DAILY WITH A MEAL  6  . meloxicam (MOBIC) 15 MG tablet     . Multiple Vitamin (MULTIVITAMIN WITH MINERALS) TABS tablet Take 1 tablet by mouth daily.    Marland Kitchen SYNTHROID 125 MCG tablet 125 mcg daily.     No current facility-administered medications on file prior to  visit.     Cardiovascular studies:  EKG 09/29/2018: Sinus rhythm 70 bpm. Normal EKG  Echocardiogram 10/28/2018: 1. Left ventricle cavity is normal in size. Normal global wall motion. Doppler evidence of grade I (impaired) diastolic dysfunction, normal LAP. Calculated EF 64%. 2. Trileaflet aortic valve with no regurgitation noted. Mild aortic valve leaflet thickening. 3. Trace mitral regurgitation. 4. Mild tricuspid regurgitation. No evidence of pulmonary hypertension.  Exercise Treadmill Stress Test 10/18/2018:  Indication: SOB The patient exercised on Bruce protocol for  04:17 min. Patient achieved  6.18 METS and reached HR  135 bpm, which is  92 % of maximum age-predicted HR.  Stress test terminated due to dyspnea.  Exercise capacity was below average for age. HR Response to Exercise: Appropriate. BP Response to Exercise: Resting hypertension 148/80 mmHg- appropriate response. Chest Pain: none. Patient  had limiting exertional dyspnea.  Arrhythmias: Occasional PVC 's. Resting EKG demonstrates Normal sinus rhythm. ST Changes: With peak exercise there was no ST-T changes of ischemia.  Overall Impression: Normal stress test with low exercise capacity. Recommendations: Continue primary/secondary prevention. Consider echocardiogram.  Recent labs: 12/06/18: K 4.4  11/22/2018: Glucose 95. BUN/Cr 13/0.73. eGFR 81. Na/K 142/5.2  Review of Systems  Constitution: Negative for decreased appetite, malaise/fatigue, weight gain and weight loss.  HENT: Negative for congestion.   Eyes: Negative for visual disturbance.  Cardiovascular: Positive for dyspnea on exertion (Stable). Negative for chest pain, leg swelling, palpitations and syncope.  Respiratory: Negative for cough.   Endocrine: Negative for cold intolerance.  Hematologic/Lymphatic: Does not bruise/bleed easily.  Skin: Negative for itching and rash.  Musculoskeletal: Negative for myalgias.  Gastrointestinal: Negative for  abdominal pain, nausea and vomiting.  Genitourinary: Negative for dysuria.  Neurological: Negative for dizziness and weakness.  Psychiatric/Behavioral: The patient is not nervous/anxious.   All other systems reviewed and are negative.       Vitals:   05/01/19 1100  BP: 134/79  Pulse: 68   (Measured by the patient using a home BP monitor)     Physical Exam  Constitutional: She is oriented to person, place, and time. She appears well-developed and well-nourished. No distress.  Pulmonary/Chest: Effort normal.  Musculoskeletal:        General: No edema.  Neurological: She is alert and oriented to person, place, and time.  Psychiatric: She has a normal mood and affect.  Nursing note and vitals reviewed.       Assessment & Recommendations:    78 year old Caucasian female with hypertension, stable renal cell carcinoma, arthritis, Graves' disease status post ablation, seen for exertional dyspnea  Hypertension: Well controlled  Exertional dyspnea: Unchanged over a year. Exercise treadmill stress test showed no ischemia in  09/2018. Echocardiogram was unremarkable. My suspicion of CAD is low.  Follow up office visit in 3 months.    Nigel Mormon, MD [facility] Pager: [phone] Office: [phone] If no answer Cell [phone]

## 2019-05-11 DIAGNOSIS — R69 Illness, unspecified: Secondary | ICD-10-CM | POA: Diagnosis not present

## 2019-06-06 DIAGNOSIS — Z87891 Personal history of nicotine dependence: Secondary | ICD-10-CM | POA: Diagnosis not present

## 2019-06-06 DIAGNOSIS — K8020 Calculus of gallbladder without cholecystitis without obstruction: Secondary | ICD-10-CM | POA: Diagnosis not present

## 2019-06-06 DIAGNOSIS — N2889 Other specified disorders of kidney and ureter: Secondary | ICD-10-CM | POA: Diagnosis not present

## 2019-06-06 DIAGNOSIS — K760 Fatty (change of) liver, not elsewhere classified: Secondary | ICD-10-CM | POA: Diagnosis not present

## 2019-06-08 DIAGNOSIS — N2889 Other specified disorders of kidney and ureter: Secondary | ICD-10-CM | POA: Diagnosis not present

## 2019-06-12 ENCOUNTER — Other Ambulatory Visit: Payer: Self-pay | Admitting: Cardiology

## 2019-06-13 ENCOUNTER — Telehealth: Payer: Self-pay | Admitting: Neurology

## 2019-06-13 NOTE — Telephone Encounter (Signed)
Due to current COVID 19 pandemic, our office is severely reducing in office visits until further notice, in order to minimize the risk to our patients and healthcare providers.   I called patient and LVM to discuss a virtual visit for her 6/23 appt. Requested pt call back to discuss appt. Office contact info provided.

## 2019-06-14 ENCOUNTER — Ambulatory Visit: Payer: Self-pay | Admitting: Neurology

## 2019-06-20 DIAGNOSIS — H52203 Unspecified astigmatism, bilateral: Secondary | ICD-10-CM | POA: Diagnosis not present

## 2019-06-20 DIAGNOSIS — H532 Diplopia: Secondary | ICD-10-CM | POA: Diagnosis not present

## 2019-06-20 DIAGNOSIS — Z961 Presence of intraocular lens: Secondary | ICD-10-CM | POA: Diagnosis not present

## 2019-06-20 DIAGNOSIS — H524 Presbyopia: Secondary | ICD-10-CM | POA: Diagnosis not present

## 2019-06-29 DIAGNOSIS — R69 Illness, unspecified: Secondary | ICD-10-CM | POA: Diagnosis not present

## 2019-07-06 DIAGNOSIS — M2011 Hallux valgus (acquired), right foot: Secondary | ICD-10-CM | POA: Diagnosis not present

## 2019-07-06 DIAGNOSIS — M79672 Pain in left foot: Secondary | ICD-10-CM | POA: Diagnosis not present

## 2019-07-06 DIAGNOSIS — M79671 Pain in right foot: Secondary | ICD-10-CM | POA: Diagnosis not present

## 2019-07-06 DIAGNOSIS — M7752 Other enthesopathy of left foot: Secondary | ICD-10-CM | POA: Diagnosis not present

## 2019-07-06 DIAGNOSIS — G5761 Lesion of plantar nerve, right lower limb: Secondary | ICD-10-CM | POA: Diagnosis not present

## 2019-07-06 DIAGNOSIS — M7751 Other enthesopathy of right foot: Secondary | ICD-10-CM | POA: Diagnosis not present

## 2019-07-06 DIAGNOSIS — M2012 Hallux valgus (acquired), left foot: Secondary | ICD-10-CM | POA: Diagnosis not present

## 2019-07-10 ENCOUNTER — Other Ambulatory Visit: Payer: Self-pay | Admitting: Cardiology

## 2019-07-11 NOTE — Telephone Encounter (Signed)
Please fill if necessary

## 2019-07-21 DIAGNOSIS — G5761 Lesion of plantar nerve, right lower limb: Secondary | ICD-10-CM | POA: Diagnosis not present

## 2019-07-21 DIAGNOSIS — M25571 Pain in right ankle and joints of right foot: Secondary | ICD-10-CM | POA: Diagnosis not present

## 2019-07-25 ENCOUNTER — Other Ambulatory Visit: Payer: Self-pay

## 2019-07-25 DIAGNOSIS — R6889 Other general symptoms and signs: Secondary | ICD-10-CM | POA: Diagnosis not present

## 2019-07-25 DIAGNOSIS — Z20822 Contact with and (suspected) exposure to covid-19: Secondary | ICD-10-CM

## 2019-07-26 LAB — NOVEL CORONAVIRUS, NAA: SARS-CoV-2, NAA: NOT DETECTED

## 2019-08-11 ENCOUNTER — Ambulatory Visit: Payer: Medicare HMO | Admitting: Cardiology

## 2019-08-11 ENCOUNTER — Encounter: Payer: Self-pay | Admitting: Cardiology

## 2019-08-11 ENCOUNTER — Other Ambulatory Visit: Payer: Self-pay

## 2019-08-11 VITALS — BP 155/72 | HR 68 | Temp 98.7°F | Ht 62.0 in | Wt 174.0 lb

## 2019-08-11 DIAGNOSIS — R0600 Dyspnea, unspecified: Secondary | ICD-10-CM

## 2019-08-11 DIAGNOSIS — I10 Essential (primary) hypertension: Secondary | ICD-10-CM | POA: Diagnosis not present

## 2019-08-11 DIAGNOSIS — R0609 Other forms of dyspnea: Secondary | ICD-10-CM | POA: Diagnosis not present

## 2019-08-11 MED ORDER — LISINOPRIL 10 MG PO TABS: 10.0000 mg | ORAL_TABLET | Freq: Every day | ORAL | 2 refills | Status: DC

## 2019-08-11 NOTE — Progress Notes (Signed)
Subjective:   [name], female    DOB: 05-20-1941, 78 y.o.   MRN: [id number]   Chief complaint:  Exertional dyspnea   HPI  78 year old Caucasian female with hypertension, stable renal cell carcinoma, arthritis, Graves' disease status post ablation, with exertional dyspnea.  Exercise treadmill stress test negative for iscehmia, with low evercise capacity. Echocardiogram realtively unremarkable. I felt her dyspnea was due to uncontrolled hypertension and deconditioning.   She continues to have exertional dyspnea. At her last visit, she could walk up to 1-1.5 miles. Now she only able to walk up to half a mile. She denies any chest pain.  Blood pressure is elevated today.    Past Medical History:  Diagnosis Date  . Cancer [facility])    kidney, seeing dr in [place]  . Grave's disease   . Reflux      Past Surgical History:  Procedure Laterality Date  . ABDOMINAL HYSTERECTOMY    . SALIVARY GLAND SURGERY    . TRIGGER FINGER RELEASE  2018  . TRIGGER FINGER RELEASE  2018     Social History   Socioeconomic History  . Marital status: Married    Spouse name: Lyndle Herrlich  . Number of children: 2  . Years of education: Dental assitant  . Highest education level: Not on file  Occupational History  . Not on file  Social Needs  . Financial resource strain: Not on file  . Food insecurity    Worry: Not on file    Inability: Not on file  . Transportation needs    Medical: Not on file    Non-medical: Not on file  Tobacco Use  . Smoking status: Former Smoker    Packs/day: 0.25    Years: 20.00    Pack years: 5.00    Types: Cigarettes    Quit date: 05/01/2004    Years since quitting: 15.2  . Smokeless tobacco: Never Used  Substance and Sexual Activity  . Alcohol use: No  . Drug use: No  . Sexual activity: Not on file  Lifestyle  . Physical activity    Days per week: Not on file    Minutes per session: Not on file  . Stress: Not on file  Relationships  . Social  Herbalist on phone: Not on file    Gets together: Not on file    Attends religious service: Not on file    Active member of club or organization: Not on file    Attends meetings of clubs or organizations: Not on file    Relationship status: Not on file  . Intimate partner violence    Fear of current or ex partner: Not on file    Emotionally abused: Not on file    Physically abused: Not on file    Forced sexual activity: Not on file  Other Topics Concern  . Not on file  Social History Narrative   Lives with husband, Lyndle Herrlich   Caffeine use: Drinks coffee daily   Right handed     Family History  Problem Relation Age of Onset  . Mental illness Maternal Grandmother      Current Outpatient Medications on File Prior to Visit  Medication Sig Dispense Refill  . celecoxib (CELEBREX) 200 MG capsule TAKE 1 CAPSULE BY MOUTH ONCE DAILY FOR PAIN    . gabapentin (NEURONTIN) 100 MG capsule TAKE 1 CAPSULE BY MOUTH ONCE DAILY FOR 14 DAYS THEN INCREASE TO 2 CAPSULES NIGHTLY AFTER EVENING MEAL    .  glucosamine-chondroitin 500-400 MG tablet Take 1 tablet by mouth daily. Liquid form    . lisinopril (ZESTRIL) 5 MG tablet Take 1 tablet by mouth once daily 30 tablet 0  . lovastatin (MEVACOR) 20 MG tablet at bedtime.   6  . Multiple Vitamin (MULTIVITAMIN WITH MINERALS) TABS tablet Take 1 tablet by mouth daily.    Marland Kitchen SYNTHROID 88 MCG tablet 88 mcg daily.      No current facility-administered medications on file prior to visit.     Cardiovascular studies:  EKG 08/11/2019: Sinus rhythm 72 bpm. Normal EKG.   EKG 09/29/2018: Sinus rhythm 70 bpm. Normal EKG  Echocardiogram 10/28/2018: 1. Left ventricle cavity is normal in size. Normal global wall motion. Doppler evidence of grade I (impaired) diastolic dysfunction, normal LAP. Calculated EF 64%. 2. Trileaflet aortic valve with no regurgitation noted. Mild aortic valve leaflet thickening. 3. Trace mitral regurgitation. 4. Mild tricuspid  regurgitation. No evidence of pulmonary hypertension.  Exercise Treadmill Stress Test 10/18/2018:  Indication: SOB The patient exercised on Bruce protocol for  04:17 min. Patient achieved  6.18 METS and reached HR  135 bpm, which is  92 % of maximum age-predicted HR.  Stress test terminated due to dyspnea.  Exercise capacity was below average for age. HR Response to Exercise: Appropriate. BP Response to Exercise: Resting hypertension 148/80 mmHg- appropriate response. Chest Pain: none. Patient  had limiting exertional dyspnea.  Arrhythmias: Occasional PVC 's. Resting EKG demonstrates Normal sinus rhythm. ST Changes: With peak exercise there was no ST-T changes of ischemia.  Overall Impression: Normal stress test with low exercise capacity. Recommendations: Continue primary/secondary prevention. Consider echocardiogram.  Recent labs: 12/06/18: K 4.4  11/22/2018: Glucose 95. BUN/Cr 13/0.73. eGFR 81. Na/K 142/5.2  Review of Systems  Constitution: Negative for decreased appetite, malaise/fatigue, weight gain and weight loss.  HENT: Negative for congestion.   Eyes: Negative for visual disturbance.  Cardiovascular: Positive for dyspnea on exertion (Stable). Negative for chest pain, leg swelling, palpitations and syncope.  Respiratory: Negative for cough.   Endocrine: Negative for cold intolerance.  Hematologic/Lymphatic: Does not bruise/bleed easily.  Skin: Negative for itching and rash.  Musculoskeletal: Negative for myalgias.  Gastrointestinal: Negative for abdominal pain, nausea and vomiting.  Genitourinary: Negative for dysuria.  Neurological: Negative for dizziness and weakness.  Psychiatric/Behavioral: The patient is not nervous/anxious.   All other systems reviewed and are negative.       Vitals:   05/01/19 1100  BP: 134/79  Pulse: 68    Physical Exam  Constitutional: She is oriented to person, place, and time. She appears well-developed and well-nourished. No  distress.  HENT:  Head: Normocephalic and atraumatic.  Eyes: Pupils are equal, round, and reactive to light. Conjunctivae are normal.  Neck: No JVD present.  Cardiovascular: Normal rate, regular rhythm and intact distal pulses.  Pulmonary/Chest: Effort normal and breath sounds normal. She has no wheezes. She has no rales.  Abdominal: Soft. Bowel sounds are normal. There is no rebound.  Musculoskeletal:        General: No edema.  Lymphadenopathy:    She has no cervical adenopathy.  Neurological: She is alert and oriented to person, place, and time. No cranial nerve deficit.  Skin: Skin is warm and dry.  Psychiatric: She has a normal mood and affect.  Nursing note and vitals reviewed.       Assessment & Recommendations:    78 year old Caucasian female with hypertension, stable renal cell carcinoma, arthritis, Graves' disease status post ablation, seen for exertional  dyspnea.  Exertional dyspnea: Worsening. Prior work up [employer] for cardiac etiology. Suspect uncontrolled hypertension could be playing a role. Increase lisinopril to10 mg daily. Recheck BMP in 2 weeks. If symptoms still persist after controlling blood pressure, could consider nuclear stress test. She may also need pulmonary evaluation.   Follow up office visit in 4 weeks.    Nigel Mormon, MD [facility] [place] Pager: [phone] Office: [phone] If no answer Cell [phone]

## 2019-08-18 DIAGNOSIS — Z0100 Encounter for examination of eyes and vision without abnormal findings: Secondary | ICD-10-CM | POA: Diagnosis not present

## 2019-08-24 DIAGNOSIS — M5432 Sciatica, left side: Secondary | ICD-10-CM | POA: Diagnosis not present

## 2019-08-24 DIAGNOSIS — Z7189 Other specified counseling: Secondary | ICD-10-CM | POA: Diagnosis not present

## 2019-08-25 ENCOUNTER — Other Ambulatory Visit: Payer: Self-pay

## 2019-08-25 DIAGNOSIS — Z20822 Contact with and (suspected) exposure to covid-19: Secondary | ICD-10-CM

## 2019-08-25 DIAGNOSIS — R6889 Other general symptoms and signs: Secondary | ICD-10-CM | POA: Diagnosis not present

## 2019-08-25 DIAGNOSIS — I10 Essential (primary) hypertension: Secondary | ICD-10-CM | POA: Diagnosis not present

## 2019-08-26 DIAGNOSIS — Z23 Encounter for immunization: Secondary | ICD-10-CM | POA: Diagnosis not present

## 2019-08-26 LAB — BASIC METABOLIC PANEL
BUN/Creatinine Ratio: 18 (ref 12–28)
BUN: 14 mg/dL (ref 8–27)
CO2: 24 mmol/L (ref 20–29)
Calcium: 9.5 mg/dL (ref 8.7–10.3)
Chloride: 104 mmol/L (ref 96–106)
Creatinine, Ser: 0.77 mg/dL (ref 0.57–1.00)
GFR calc Af Amer: 87 mL/min/{1.73_m2} (ref >59)
GFR calc non Af Amer: 76 mL/min/{1.73_m2} (ref >59)
Glucose: 116 mg/dL — ABNORMAL HIGH (ref 65–99)
Potassium: 4.1 mmol/L (ref 3.5–5.2)
Sodium: 142 mmol/L (ref 134–144)

## 2019-08-26 LAB — NOVEL CORONAVIRUS, NAA: SARS-CoV-2, NAA: NOT DETECTED

## 2019-08-26 NOTE — Progress Notes (Signed)
LVM FOR PT

## 2019-09-19 ENCOUNTER — Other Ambulatory Visit: Payer: Self-pay

## 2019-09-19 ENCOUNTER — Ambulatory Visit: Payer: Medicare HMO | Admitting: Cardiology

## 2019-09-19 ENCOUNTER — Encounter: Payer: Self-pay | Admitting: Cardiology

## 2019-09-19 VITALS — BP 131/71 | HR 82 | Ht 62.0 in | Wt 175.5 lb

## 2019-09-19 DIAGNOSIS — R0609 Other forms of dyspnea: Secondary | ICD-10-CM

## 2019-09-19 DIAGNOSIS — M79604 Pain in right leg: Secondary | ICD-10-CM | POA: Insufficient documentation

## 2019-09-19 DIAGNOSIS — M79605 Pain in left leg: Secondary | ICD-10-CM | POA: Diagnosis not present

## 2019-09-19 DIAGNOSIS — I10 Essential (primary) hypertension: Secondary | ICD-10-CM

## 2019-09-19 DIAGNOSIS — R0600 Dyspnea, unspecified: Secondary | ICD-10-CM

## 2019-09-19 NOTE — Progress Notes (Signed)
Subjective:   [name], female    DOB: [birth year], 78 y.o.   MRN: [id number]   Chief complaint:  Exertional dyspnea   HPI  78 year old Caucasian female with hypertension, stable renal cell carcinoma, arthritis, Graves' disease status post ablation, with exertional dyspnea.  Exercise treadmill stress test negative for ischemia in 09/2018. with low evercise capacity. Echocardiogram realtively unremarkable in 10/2018. I felt her dyspnea was due to uncontrolled hypertension and deconditioning.   She continues to have exertional dyspnea, and is progressively worse. She also complains of bilateral elg pain, both at rest and exertion. Blood pressure is now well controlled.  Past Medical History:  Diagnosis Date  . Cancer [facility])    kidney, seeing dr in [place]  . Grave's disease   . Reflux      Past Surgical History:  Procedure Laterality Date  . ABDOMINAL HYSTERECTOMY    . SALIVARY GLAND SURGERY    . TRIGGER FINGER RELEASE  2018  . TRIGGER FINGER RELEASE  2018     Social History   Socioeconomic History  . Marital status: Married    Spouse name: [name]  . Number of children: 2  . Years of education: Dental assitant  . Highest education level: Not on file  Occupational History  . Not on file  Social Needs  . Financial resource strain: Not on file  . Food insecurity    Worry: Not on file    Inability: Not on file  . Transportation needs    Medical: Not on file    Non-medical: Not on file  Tobacco Use  . Smoking status: Former Smoker    Packs/day: 0.25    Years: 20.00    Pack years: 5.00    Types: Cigarettes    Quit date: 05/01/2004    Years since quitting: 15.3  . Smokeless tobacco: Never Used  Substance and Sexual Activity  . Alcohol use: No  . Drug use: No  . Sexual activity: Not on file  Lifestyle  . Physical activity    Days per week: Not on file    Minutes per session: Not on file  . Stress: Not on file  Relationships  . Social Product manager on phone: Not on file    Gets together: Not on file    Attends religious service: Not on file    Active member of club or organization: Not on file    Attends meetings of clubs or organizations: Not on file    Relationship status: Not on file  . Intimate partner violence    Fear of current or ex partner: Not on file    Emotionally abused: Not on file    Physically abused: Not on file    Forced sexual activity: Not on file  Other Topics Concern  . Not on file  Social History Narrative   Lives with husband, [name]   Caffeine use: Drinks coffee daily   Right handed     Family History  Problem Relation Age of Onset  . Mental illness Maternal Grandmother      Current Outpatient Medications on File Prior to Visit  Medication Sig Dispense Refill  . celecoxib (CELEBREX) 200 MG capsule TAKE 1 CAPSULE BY MOUTH ONCE DAILY FOR PAIN    . gabapentin (NEURONTIN) 100 MG capsule TAKE 1 CAPSULE BY MOUTH ONCE DAILY FOR 14 DAYS THEN INCREASE TO 2 CAPSULES NIGHTLY AFTER EVENING MEAL    . glucosamine-chondroitin 500-400 MG tablet Take 1  tablet by mouth daily. Liquid form    . lisinopril (ZESTRIL) 10 MG tablet Take 1 tablet (10 mg total) by mouth daily. 30 tablet 2  . lovastatin (MEVACOR) 20 MG tablet at bedtime.   6  . Multiple Vitamin (MULTIVITAMIN WITH MINERALS) TABS tablet Take 1 tablet by mouth daily.    . NON FORMULARY CBD Gummies    . SYNTHROID 88 MCG tablet 88 mcg daily.      No current facility-administered medications on file prior to visit.     Cardiovascular studies:  EKG 08/11/2019: Sinus rhythm 72 bpm. Normal EKG.   EKG 09/29/2018: Sinus rhythm 70 bpm. Normal EKG  Echocardiogram 10/28/2018: 1. Left ventricle cavity is normal in size. Normal global wall motion. Doppler evidence of grade I (impaired) diastolic dysfunction, normal LAP. Calculated EF 64%. 2. Trileaflet aortic valve with no regurgitation noted. Mild aortic valve leaflet thickening. 3. Trace mitral  regurgitation. 4. Mild tricuspid regurgitation. No evidence of pulmonary hypertension.  Exercise Treadmill Stress Test 10/18/2018:  Indication: SOB The patient exercised on Bruce protocol for  04:17 min. Patient achieved  6.18 METS and reached HR  135 bpm, which is  92 % of maximum age-predicted HR.  Stress test terminated due to dyspnea.  Exercise capacity was below average for age. HR Response to Exercise: Appropriate. BP Response to Exercise: Resting hypertension 148/80 mmHg- appropriate response. Chest Pain: none. Patient  had limiting exertional dyspnea.  Arrhythmias: Occasional PVC 's. Resting EKG demonstrates Normal sinus rhythm. ST Changes: With peak exercise there was no ST-T changes of ischemia.  Overall Impression: Normal stress test with low exercise capacity. Recommendations: Continue primary/secondary prevention. Consider echocardiogram.  Recent labs: Results for Bethany, Martin (MRN [id number]) as of 09/19/2019 09:02  Ref. Range 11/20/2017 10:46 08/25/2019 08:49  Sodium Latest Ref Range: 134 - 144 mmol/L 143 142  Potassium Latest Ref Range: 3.5 - 5.2 mmol/L 4.9 4.1  Chloride Latest Ref Range: 96 - 106 mmol/L 102 104  CO2 Latest Ref Range: 20 - 29 mmol/L 27 24  Glucose Latest Ref Range: 65 - 99 mg/dL 69 116 (H)  BUN Latest Ref Range: 8 - 27 mg/dL 15 14  Creatinine Latest Ref Range: 0.57 - 1.00 mg/dL 1.00 0.77  Calcium Latest Ref Range: 8.7 - 10.3 mg/dL 9.8 9.5  BUN/Creatinine Ratio Latest Ref Range: 12 - _0 Alkaline Phosphatase Latest Ref Range: 39 - 117 IU/L 73   Albumin Latest Ref Range: 3.5 - 4.8 g/dL 4.3   Albumin/Globulin Ratio Latest Ref Range: 1.2 - 2.2  1.9   AST Latest Ref Range: 0 - 40 IU/L 29   ALT Latest Ref Range: 0 - 32 IU/L 21   Total Protein Latest Ref Range: 6.0 - 8.5 g/dL 6.6   Total Bilirubin Latest Ref Range: 0.0 - 1.2 mg/dL 0.3   GFR, Est Non African American Latest Ref Range: >59 mL/min/1.73 56 (L) 76  GFR, Est African American Latest  Ref Range: >59 mL/min/1.73 64 87    11/22/2018: Glucose 95. BUN/Cr 13/0.73. eGFR 81. Na/K 142/5.2  Review of Systems  Constitution: Negative for decreased appetite, malaise/fatigue, weight gain and weight loss.  HENT: Negative for congestion.   Eyes: Negative for visual disturbance.  Cardiovascular: Positive for dyspnea on exertion (Stable). Negative for chest pain, leg swelling, palpitations and syncope.  Respiratory: Negative for cough.   Endocrine: Negative for cold intolerance.  Hematologic/Lymphatic: Does not bruise/bleed easily.  Skin: Negative for itching and rash.  Musculoskeletal: Negative for  myalgias.       Bilateral leg pain  Gastrointestinal: Negative for abdominal pain, nausea and vomiting.  Genitourinary: Negative for dysuria.  Neurological: Positive for paresthesias (Burning feet). Negative for dizziness and weakness.  Psychiatric/Behavioral: The patient is not nervous/anxious.   All other systems reviewed and are negative.      Today's Vitals   09/19/19 1345  BP: 131/71  Pulse: 82  SpO2: 99%  Weight: 175 lb 8 oz (79.6 kg)  Height: 5' 2" (1.575 m)   Body mass index is 32.1 kg/m.   Physical Exam  Constitutional: She is oriented to person, place, and time. She appears well-developed and well-nourished. No distress.  HENT:  Head: Normocephalic and atraumatic.  Eyes: Pupils are equal, round, and reactive to light. Conjunctivae are normal.  Neck: No JVD present.  Cardiovascular: Normal rate, regular rhythm and intact distal pulses.  Pulmonary/Chest: Effort normal and breath sounds normal. She has no wheezes. She has no rales.  Abdominal: Soft. Bowel sounds are normal. There is no rebound.  Musculoskeletal:        General: No edema.  Lymphadenopathy:    She has no cervical adenopathy.  Neurological: She is alert and oriented to person, place, and time. No cranial nerve deficit.  Skin: Skin is warm and dry.  Psychiatric: She has a normal mood and affect.   Nursing note and vitals reviewed.       Assessment & Recommendations:    78 year old Caucasian female with hypertension, stable renal cell carcinoma, arthritis, Graves' disease status post ablation, seen for exertional dyspnea.  Exertional dyspnea: Worsening. I do think her symptoms are multifactorial. Will repeat echocardiogram and obtain exercise/Lexiscan nuclear stress test.   Bilateral leg pain, paresthesias: Vascular exam is unremarkable. Will obtain ABI. If this is normal, suspect neuropathy.   Hypertension: Well controlled on lisinopril 10 mg.  Virtual f/u after above tests.   Nigel Mormon, MD [facility] [place] Pager: [phone] Office: [phone] If no answer Cell [phone]

## 2019-09-21 DIAGNOSIS — Z7189 Other specified counseling: Secondary | ICD-10-CM | POA: Diagnosis not present

## 2019-09-21 DIAGNOSIS — M5432 Sciatica, left side: Secondary | ICD-10-CM | POA: Diagnosis not present

## 2019-09-22 DIAGNOSIS — R69 Illness, unspecified: Secondary | ICD-10-CM | POA: Diagnosis not present

## 2019-09-29 DIAGNOSIS — I2510 Atherosclerotic heart disease of native coronary artery without angina pectoris: Secondary | ICD-10-CM | POA: Diagnosis not present

## 2019-09-29 DIAGNOSIS — E782 Mixed hyperlipidemia: Secondary | ICD-10-CM | POA: Diagnosis not present

## 2019-09-30 ENCOUNTER — Other Ambulatory Visit: Payer: Self-pay

## 2019-09-30 ENCOUNTER — Ambulatory Visit (INDEPENDENT_AMBULATORY_CARE_PROVIDER_SITE_OTHER): Payer: Medicare HMO

## 2019-09-30 DIAGNOSIS — R0609 Other forms of dyspnea: Secondary | ICD-10-CM | POA: Diagnosis not present

## 2019-09-30 DIAGNOSIS — R0600 Dyspnea, unspecified: Secondary | ICD-10-CM

## 2019-10-06 DIAGNOSIS — M48061 Spinal stenosis, lumbar region without neurogenic claudication: Secondary | ICD-10-CM | POA: Diagnosis not present

## 2019-10-06 DIAGNOSIS — M545 Low back pain: Secondary | ICD-10-CM | POA: Diagnosis not present

## 2019-10-06 DIAGNOSIS — E782 Mixed hyperlipidemia: Secondary | ICD-10-CM | POA: Diagnosis not present

## 2019-10-06 DIAGNOSIS — D638 Anemia in other chronic diseases classified elsewhere: Secondary | ICD-10-CM | POA: Diagnosis not present

## 2019-10-06 DIAGNOSIS — M6281 Muscle weakness (generalized): Secondary | ICD-10-CM | POA: Diagnosis not present

## 2019-10-06 DIAGNOSIS — R262 Difficulty in walking, not elsewhere classified: Secondary | ICD-10-CM | POA: Diagnosis not present

## 2019-10-06 DIAGNOSIS — R531 Weakness: Secondary | ICD-10-CM | POA: Diagnosis not present

## 2019-10-06 DIAGNOSIS — I700 Atherosclerosis of aorta: Secondary | ICD-10-CM | POA: Diagnosis not present

## 2019-10-06 DIAGNOSIS — N2889 Other specified disorders of kidney and ureter: Secondary | ICD-10-CM | POA: Diagnosis not present

## 2019-10-06 DIAGNOSIS — I2510 Atherosclerotic heart disease of native coronary artery without angina pectoris: Secondary | ICD-10-CM | POA: Diagnosis not present

## 2019-10-06 DIAGNOSIS — M5432 Sciatica, left side: Secondary | ICD-10-CM | POA: Diagnosis not present

## 2019-10-10 ENCOUNTER — Ambulatory Visit: Payer: Medicare HMO | Admitting: Cardiology

## 2019-10-11 DIAGNOSIS — R262 Difficulty in walking, not elsewhere classified: Secondary | ICD-10-CM | POA: Diagnosis not present

## 2019-10-11 DIAGNOSIS — M545 Low back pain: Secondary | ICD-10-CM | POA: Diagnosis not present

## 2019-10-11 DIAGNOSIS — M6281 Muscle weakness (generalized): Secondary | ICD-10-CM | POA: Diagnosis not present

## 2019-10-13 DIAGNOSIS — M6281 Muscle weakness (generalized): Secondary | ICD-10-CM | POA: Diagnosis not present

## 2019-10-13 DIAGNOSIS — R262 Difficulty in walking, not elsewhere classified: Secondary | ICD-10-CM | POA: Diagnosis not present

## 2019-10-13 DIAGNOSIS — M545 Low back pain: Secondary | ICD-10-CM | POA: Diagnosis not present

## 2019-10-18 DIAGNOSIS — M6281 Muscle weakness (generalized): Secondary | ICD-10-CM | POA: Diagnosis not present

## 2019-10-18 DIAGNOSIS — R262 Difficulty in walking, not elsewhere classified: Secondary | ICD-10-CM | POA: Diagnosis not present

## 2019-10-18 DIAGNOSIS — M545 Low back pain: Secondary | ICD-10-CM | POA: Diagnosis not present

## 2019-10-20 DIAGNOSIS — I2510 Atherosclerotic heart disease of native coronary artery without angina pectoris: Secondary | ICD-10-CM | POA: Diagnosis not present

## 2019-10-20 DIAGNOSIS — M5432 Sciatica, left side: Secondary | ICD-10-CM | POA: Diagnosis not present

## 2019-10-20 DIAGNOSIS — G603 Idiopathic progressive neuropathy: Secondary | ICD-10-CM | POA: Diagnosis not present

## 2019-10-20 DIAGNOSIS — M545 Low back pain: Secondary | ICD-10-CM | POA: Diagnosis not present

## 2019-10-20 DIAGNOSIS — E782 Mixed hyperlipidemia: Secondary | ICD-10-CM | POA: Diagnosis not present

## 2019-10-20 DIAGNOSIS — R262 Difficulty in walking, not elsewhere classified: Secondary | ICD-10-CM | POA: Diagnosis not present

## 2019-10-20 DIAGNOSIS — M6281 Muscle weakness (generalized): Secondary | ICD-10-CM | POA: Diagnosis not present

## 2019-10-24 ENCOUNTER — Other Ambulatory Visit: Payer: Self-pay

## 2019-10-24 ENCOUNTER — Ambulatory Visit (INDEPENDENT_AMBULATORY_CARE_PROVIDER_SITE_OTHER): Payer: Medicare HMO

## 2019-10-24 DIAGNOSIS — R0600 Dyspnea, unspecified: Secondary | ICD-10-CM

## 2019-10-24 DIAGNOSIS — R0609 Other forms of dyspnea: Secondary | ICD-10-CM | POA: Diagnosis not present

## 2019-10-25 DIAGNOSIS — M6281 Muscle weakness (generalized): Secondary | ICD-10-CM | POA: Diagnosis not present

## 2019-10-25 DIAGNOSIS — M545 Low back pain: Secondary | ICD-10-CM | POA: Diagnosis not present

## 2019-10-25 DIAGNOSIS — R262 Difficulty in walking, not elsewhere classified: Secondary | ICD-10-CM | POA: Diagnosis not present

## 2019-10-25 NOTE — Progress Notes (Signed)
Pt wants to know since everything was normal does she need to come back for an appt?

## 2019-10-25 NOTE — Progress Notes (Signed)
If her symptoms are better, okay to cancel the appt. In that case, reschedule f/u in 6 months.  Thanks MJP

## 2019-10-26 DIAGNOSIS — H547 Unspecified visual loss: Secondary | ICD-10-CM | POA: Diagnosis not present

## 2019-10-26 DIAGNOSIS — G629 Polyneuropathy, unspecified: Secondary | ICD-10-CM | POA: Diagnosis not present

## 2019-10-26 DIAGNOSIS — Z008 Encounter for other general examination: Secondary | ICD-10-CM | POA: Diagnosis not present

## 2019-10-26 DIAGNOSIS — E785 Hyperlipidemia, unspecified: Secondary | ICD-10-CM | POA: Diagnosis not present

## 2019-10-26 DIAGNOSIS — I10 Essential (primary) hypertension: Secondary | ICD-10-CM | POA: Diagnosis not present

## 2019-10-26 DIAGNOSIS — H269 Unspecified cataract: Secondary | ICD-10-CM | POA: Diagnosis not present

## 2019-10-26 DIAGNOSIS — Z85528 Personal history of other malignant neoplasm of kidney: Secondary | ICD-10-CM | POA: Diagnosis not present

## 2019-10-26 DIAGNOSIS — E669 Obesity, unspecified: Secondary | ICD-10-CM | POA: Diagnosis not present

## 2019-10-26 DIAGNOSIS — Z791 Long term (current) use of non-steroidal anti-inflammatories (NSAID): Secondary | ICD-10-CM | POA: Diagnosis not present

## 2019-10-26 DIAGNOSIS — Z87891 Personal history of nicotine dependence: Secondary | ICD-10-CM | POA: Diagnosis not present

## 2019-10-26 DIAGNOSIS — G8929 Other chronic pain: Secondary | ICD-10-CM | POA: Diagnosis not present

## 2019-10-27 DIAGNOSIS — R262 Difficulty in walking, not elsewhere classified: Secondary | ICD-10-CM | POA: Diagnosis not present

## 2019-10-27 DIAGNOSIS — M545 Low back pain: Secondary | ICD-10-CM | POA: Diagnosis not present

## 2019-10-27 DIAGNOSIS — M6281 Muscle weakness (generalized): Secondary | ICD-10-CM | POA: Diagnosis not present

## 2019-10-28 NOTE — Progress Notes (Signed)
Patient symptoms have resolved. Patient rescheduled to 04/24/19.

## 2019-10-31 ENCOUNTER — Encounter

## 2019-10-31 ENCOUNTER — Encounter: Payer: Self-pay | Admitting: Neurology

## 2019-10-31 ENCOUNTER — Ambulatory Visit: Payer: Medicare HMO | Admitting: Neurology

## 2019-10-31 ENCOUNTER — Other Ambulatory Visit: Payer: Self-pay

## 2019-10-31 VITALS — BP 134/74 | HR 78 | Temp 97.6°F | Ht 62.0 in | Wt 179.4 lb

## 2019-10-31 DIAGNOSIS — R413 Other amnesia: Secondary | ICD-10-CM

## 2019-10-31 DIAGNOSIS — M6281 Muscle weakness (generalized): Secondary | ICD-10-CM | POA: Diagnosis not present

## 2019-10-31 NOTE — Progress Notes (Signed)
Reason for visit: Memory disturbance, leg weakness  [name] is an 78 y.o. female  History of present illness:  [name] is a 78 year old right-handed white female with a history of lower extremity weakness has been present for a number of years.  The patient is having some sciatica pain currently down the left leg but this has improved with physical therapy, she believes her leg strength is also improved.  The patient is walking a half a mile to a mile a day.  She has difficulty getting up out of a chair or walking upstairs, she has shortness of breath with physical activity, she was seen and evaluated through cardiology but no issues were noted.  The patient believes that her memory has been quite stable over time, she mainly has some troubles with long-term memory, not short-term memory.  The patient has not wanted to go on any medications for memory in the past, she returns to this office for an evaluation.  Past Medical History:  Diagnosis Date  . Cancer [facility])    kidney, seeing dr in [place]  . Grave's disease   . Reflux     Past Surgical History:  Procedure Laterality Date  . ABDOMINAL HYSTERECTOMY    . SALIVARY GLAND SURGERY    . TRIGGER FINGER RELEASE  2018  . TRIGGER FINGER RELEASE  2018    Family History  Problem Relation Age of Onset  . Cancer - Lung Father   . Mental illness Maternal Grandmother     Social history:  reports that she quit smoking about 15 years ago. Her smoking use included cigarettes. She has a 5.00 pack-year smoking history. She has never used smokeless tobacco. She reports that she does not drink alcohol or use drugs.    Allergies  Allergen Reactions  . Bactrim Other (See Comments)    Unsure childhood reaction.  . Erythromycin Hives    Broke out in hives  . Nitrofurantoin Monohyd Macro Rash  . Vicodin [Hydrocodone-Acetaminophen] Rash    Medications:  Prior to Admission medications   Medication Sig Start Date End Date  Taking? Authorizing Provider  gabapentin (NEURONTIN) 100 MG capsule TAKE 1 CAPSULE BY MOUTH ONCE DAILY FOR 14 DAYS THEN INCREASE TO 2 CAPSULES NIGHTLY AFTER EVENING MEAL 04/27/19  Yes [provider]  lisinopril (ZESTRIL) 10 MG tablet Take 1 tablet (10 mg total) by mouth daily. 08/11/19 08/10/20 Yes Patwardhan, Manish J, MD  lovastatin (MEVACOR) 20 MG tablet at bedtime.  08/23/18  Yes [provider]  Multiple Vitamin (MULTIVITAMIN WITH MINERALS) TABS tablet Take 1 tablet by mouth daily.   Yes [provider]  NON FORMULARY CBD Gummies   Yes [provider]  SYNTHROID 88 MCG tablet 88 mcg daily.  08/24/18  Yes [provider]    ROS:  Out of a complete 14 system review of symptoms, the patient complains only of the following symptoms, and all other reviewed systems are negative.  Memory disorder Leg weakness Left leg pain  Blood pressure 134/74, pulse 78, temperature 97.6 F (36.4 C), temperature source Temporal, height 5\' 2"  (1.575 m), weight 179 lb 6 oz (81.4 kg), SpO2 97 %.  Physical Exam  General: The patient is alert and cooperative at the time of the examination.  Skin: No significant peripheral edema is noted.   Neurologic Exam  Mental status: The patient is alert and oriented x 3 at the time of the examination. The patient has apparent normal recent and remote  memory, with an apparently normal attention span and concentration ability.  Mini-Mental status examination done today shows a total score of 29/30.   Cranial nerves: Facial symmetry is present. Speech is normal, no aphasia or dysarthria is noted. Extraocular movements are full. Visual fields are full.  Motor: The patient has good strength in all 4 extremities, with exception of 4/5 strength of hip flexion bilaterally.  Sensory examination: Soft touch sensation is symmetric on the face, arms, and legs.  Coordination: The patient has good finger-nose-finger and heel-to-shin  bilaterally.  Gait and station: The patient has the ability to walk independently, she can arise from a seated position with arms crossed.  Once up, she appears to have some decreased arm swing bilaterally but more prominent on the left.  Tandem gait is unsteady.  Romberg is negative.  Reflexes: Deep tendon reflexes are symmetric.   MRI brain 12/06/17:  IMPRESSION: Unremarkable MRI scan of the brain showing mild age-appropriate changes of chronic microvascular ischemia. Incidental mild changes of chronic paranasal sinus inflammation are noted.  * MRI scan images were reviewed online. I agree with the written report.   Assessment/Plan:  1.  Mild memory disturbance  2.  Bilateral lower extremity weakness  The patient continues to have some weakness in both legs but it appears to be stable from her initial examination in 2018.  The patient does not wish to go on any medications for memory, we will continue to follow the memory over time.  The patient does have some decreased arm swing with walking, we will follow her as well for any developing parkinsonian features.  Jill Alexanders MD 10/31/2019 10:21 AM  [facility] [address] [place], [place] [address]  Phone [phone] Fax [phone]

## 2019-11-01 DIAGNOSIS — R262 Difficulty in walking, not elsewhere classified: Secondary | ICD-10-CM | POA: Diagnosis not present

## 2019-11-01 DIAGNOSIS — M545 Low back pain: Secondary | ICD-10-CM | POA: Diagnosis not present

## 2019-11-01 DIAGNOSIS — M6281 Muscle weakness (generalized): Secondary | ICD-10-CM | POA: Diagnosis not present

## 2019-11-02 ENCOUNTER — Ambulatory Visit: Payer: Medicare HMO | Admitting: Cardiology

## 2019-11-06 ENCOUNTER — Other Ambulatory Visit: Payer: Self-pay | Admitting: Cardiology

## 2019-11-06 DIAGNOSIS — I10 Essential (primary) hypertension: Secondary | ICD-10-CM

## 2019-11-08 DIAGNOSIS — M6281 Muscle weakness (generalized): Secondary | ICD-10-CM | POA: Diagnosis not present

## 2019-11-08 DIAGNOSIS — R262 Difficulty in walking, not elsewhere classified: Secondary | ICD-10-CM | POA: Diagnosis not present

## 2019-11-08 DIAGNOSIS — M545 Low back pain: Secondary | ICD-10-CM | POA: Diagnosis not present

## 2019-11-10 DIAGNOSIS — R262 Difficulty in walking, not elsewhere classified: Secondary | ICD-10-CM | POA: Diagnosis not present

## 2019-11-10 DIAGNOSIS — M545 Low back pain: Secondary | ICD-10-CM | POA: Diagnosis not present

## 2019-11-10 DIAGNOSIS — M6281 Muscle weakness (generalized): Secondary | ICD-10-CM | POA: Diagnosis not present

## 2019-11-14 DIAGNOSIS — M6281 Muscle weakness (generalized): Secondary | ICD-10-CM | POA: Diagnosis not present

## 2019-11-14 DIAGNOSIS — R262 Difficulty in walking, not elsewhere classified: Secondary | ICD-10-CM | POA: Diagnosis not present

## 2019-11-14 DIAGNOSIS — M545 Low back pain: Secondary | ICD-10-CM | POA: Diagnosis not present

## 2019-11-16 DIAGNOSIS — M6281 Muscle weakness (generalized): Secondary | ICD-10-CM | POA: Diagnosis not present

## 2019-11-16 DIAGNOSIS — M545 Low back pain: Secondary | ICD-10-CM | POA: Diagnosis not present

## 2019-11-16 DIAGNOSIS — R262 Difficulty in walking, not elsewhere classified: Secondary | ICD-10-CM | POA: Diagnosis not present

## 2019-11-24 DIAGNOSIS — M6281 Muscle weakness (generalized): Secondary | ICD-10-CM | POA: Diagnosis not present

## 2019-11-24 DIAGNOSIS — M545 Low back pain: Secondary | ICD-10-CM | POA: Diagnosis not present

## 2019-11-24 DIAGNOSIS — R262 Difficulty in walking, not elsewhere classified: Secondary | ICD-10-CM | POA: Diagnosis not present

## 2019-12-04 ENCOUNTER — Other Ambulatory Visit: Payer: Self-pay | Admitting: Cardiology

## 2019-12-04 DIAGNOSIS — I10 Essential (primary) hypertension: Secondary | ICD-10-CM

## 2019-12-05 DIAGNOSIS — R262 Difficulty in walking, not elsewhere classified: Secondary | ICD-10-CM | POA: Diagnosis not present

## 2019-12-05 DIAGNOSIS — M6281 Muscle weakness (generalized): Secondary | ICD-10-CM | POA: Diagnosis not present

## 2019-12-05 DIAGNOSIS — M545 Low back pain: Secondary | ICD-10-CM | POA: Diagnosis not present

## 2019-12-09 DIAGNOSIS — R262 Difficulty in walking, not elsewhere classified: Secondary | ICD-10-CM | POA: Diagnosis not present

## 2019-12-09 DIAGNOSIS — M545 Low back pain: Secondary | ICD-10-CM | POA: Diagnosis not present

## 2019-12-09 DIAGNOSIS — M6281 Muscle weakness (generalized): Secondary | ICD-10-CM | POA: Diagnosis not present

## 2019-12-12 DIAGNOSIS — M6281 Muscle weakness (generalized): Secondary | ICD-10-CM | POA: Diagnosis not present

## 2019-12-12 DIAGNOSIS — R262 Difficulty in walking, not elsewhere classified: Secondary | ICD-10-CM | POA: Diagnosis not present

## 2019-12-12 DIAGNOSIS — M545 Low back pain: Secondary | ICD-10-CM | POA: Diagnosis not present

## 2019-12-19 DIAGNOSIS — R262 Difficulty in walking, not elsewhere classified: Secondary | ICD-10-CM | POA: Diagnosis not present

## 2019-12-19 DIAGNOSIS — M6281 Muscle weakness (generalized): Secondary | ICD-10-CM | POA: Diagnosis not present

## 2019-12-19 DIAGNOSIS — M545 Low back pain: Secondary | ICD-10-CM | POA: Diagnosis not present

## 2019-12-26 DIAGNOSIS — M545 Low back pain: Secondary | ICD-10-CM | POA: Diagnosis not present

## 2019-12-26 DIAGNOSIS — M6281 Muscle weakness (generalized): Secondary | ICD-10-CM | POA: Diagnosis not present

## 2019-12-26 DIAGNOSIS — R262 Difficulty in walking, not elsewhere classified: Secondary | ICD-10-CM | POA: Diagnosis not present

## 2020-01-02 DIAGNOSIS — R69 Illness, unspecified: Secondary | ICD-10-CM | POA: Diagnosis not present

## 2020-01-03 DIAGNOSIS — M6281 Muscle weakness (generalized): Secondary | ICD-10-CM | POA: Diagnosis not present

## 2020-01-03 DIAGNOSIS — M545 Low back pain: Secondary | ICD-10-CM | POA: Diagnosis not present

## 2020-01-03 DIAGNOSIS — R262 Difficulty in walking, not elsewhere classified: Secondary | ICD-10-CM | POA: Diagnosis not present

## 2020-01-06 ENCOUNTER — Other Ambulatory Visit: Payer: Self-pay | Admitting: Cardiology

## 2020-01-06 DIAGNOSIS — I10 Essential (primary) hypertension: Secondary | ICD-10-CM

## 2020-01-10 DIAGNOSIS — M6281 Muscle weakness (generalized): Secondary | ICD-10-CM | POA: Diagnosis not present

## 2020-01-10 DIAGNOSIS — M545 Low back pain: Secondary | ICD-10-CM | POA: Diagnosis not present

## 2020-01-10 DIAGNOSIS — R262 Difficulty in walking, not elsewhere classified: Secondary | ICD-10-CM | POA: Diagnosis not present

## 2020-01-11 ENCOUNTER — Telehealth: Payer: Self-pay

## 2020-01-11 NOTE — Telephone Encounter (Signed)
No nothing has changed; she is just going off of what the email says

## 2020-01-11 NOTE — Telephone Encounter (Signed)
Does not need it. Please cancel.

## 2020-01-11 NOTE — Telephone Encounter (Signed)
Telephone encounter:  Reason for call: Pt says that she got an email stating that lisinopril is bad for you and causes kidney issues and she just got diagnosed with kidney cancer so she is going to stop taking it; she wants to make sure the other meds she is taking is ok to resume   Usual provider: MP  Last office visit: 11/02/2019  Next office visit: 04/23/2020   Last hospitalization:    Current Outpatient Medications on File Prior to Visit  Medication Sig Dispense Refill  . gabapentin (NEURONTIN) 100 MG capsule TAKE 1 CAPSULE BY MOUTH ONCE DAILY FOR 14 DAYS THEN INCREASE TO 2 CAPSULES NIGHTLY AFTER EVENING MEAL    . lisinopril (ZESTRIL) 10 MG tablet Take 1 tablet by mouth once daily 30 tablet 0  . lovastatin (MEVACOR) 20 MG tablet at bedtime.   6  . Multiple Vitamin (MULTIVITAMIN WITH MINERALS) TABS tablet Take 1 tablet by mouth daily.    . NON FORMULARY CBD Gummies    . SYNTHROID 88 MCG tablet 88 mcg daily.      No current facility-administered medications on file prior to visit.

## 2020-01-11 NOTE — Telephone Encounter (Signed)
I do not see any reason to stop it.

## 2020-01-11 NOTE — Telephone Encounter (Signed)
Ok also she is scheduled for a echo 01/16/2020 she just had one 09/2019 is there a reason for that ?

## 2020-01-11 NOTE — Telephone Encounter (Signed)
Based on her last results in 08/2019, I do not think there is any contraindication for her to be on lisinopril. Has anything changed since in terms of labwork?

## 2020-01-12 NOTE — Telephone Encounter (Signed)
Needs ABI. Keep it.  Thanks MJP

## 2020-01-12 NOTE — Telephone Encounter (Signed)
Dr Virgina Jock, Patient is coming in for an ABI on 01/16/20, not an ECHO.  Does it still need to be cancelled?   -[place]

## 2020-01-12 NOTE — Telephone Encounter (Signed)
Lvm with details

## 2020-01-16 ENCOUNTER — Other Ambulatory Visit: Payer: Self-pay

## 2020-01-16 ENCOUNTER — Ambulatory Visit (INDEPENDENT_AMBULATORY_CARE_PROVIDER_SITE_OTHER): Payer: Medicare HMO

## 2020-01-16 DIAGNOSIS — M79604 Pain in right leg: Secondary | ICD-10-CM

## 2020-01-16 DIAGNOSIS — M79605 Pain in left leg: Secondary | ICD-10-CM

## 2020-01-16 DIAGNOSIS — R0609 Other forms of dyspnea: Secondary | ICD-10-CM | POA: Diagnosis not present

## 2020-01-16 DIAGNOSIS — R0600 Dyspnea, unspecified: Secondary | ICD-10-CM

## 2020-01-17 DIAGNOSIS — R262 Difficulty in walking, not elsewhere classified: Secondary | ICD-10-CM | POA: Diagnosis not present

## 2020-01-17 DIAGNOSIS — M545 Low back pain: Secondary | ICD-10-CM | POA: Diagnosis not present

## 2020-01-17 DIAGNOSIS — M6281 Muscle weakness (generalized): Secondary | ICD-10-CM | POA: Diagnosis not present

## 2020-01-19 ENCOUNTER — Telehealth: Payer: Self-pay

## 2020-01-19 NOTE — Telephone Encounter (Signed)
-----   Message from [facility], MD sent at 01/19/2020  8:26 AM EST ----- Normal circulation in leg arteries.  Thanks MJP

## 2020-01-19 NOTE — Telephone Encounter (Signed)
Gave results to patient she verbalized understanding. 

## 2020-01-24 DIAGNOSIS — M545 Low back pain: Secondary | ICD-10-CM | POA: Diagnosis not present

## 2020-01-24 DIAGNOSIS — R262 Difficulty in walking, not elsewhere classified: Secondary | ICD-10-CM | POA: Diagnosis not present

## 2020-01-24 DIAGNOSIS — M6281 Muscle weakness (generalized): Secondary | ICD-10-CM | POA: Diagnosis not present

## 2020-01-31 DIAGNOSIS — R69 Illness, unspecified: Secondary | ICD-10-CM | POA: Diagnosis not present

## 2020-02-02 DIAGNOSIS — R69 Illness, unspecified: Secondary | ICD-10-CM | POA: Diagnosis not present

## 2020-02-04 ENCOUNTER — Other Ambulatory Visit: Payer: Self-pay | Admitting: Cardiology

## 2020-02-04 DIAGNOSIS — I10 Essential (primary) hypertension: Secondary | ICD-10-CM

## 2020-02-08 ENCOUNTER — Other Ambulatory Visit: Payer: Self-pay | Admitting: Cardiology

## 2020-02-08 DIAGNOSIS — I10 Essential (primary) hypertension: Secondary | ICD-10-CM

## 2020-03-05 DIAGNOSIS — Z6831 Body mass index (BMI) 31.0-31.9, adult: Secondary | ICD-10-CM | POA: Diagnosis not present

## 2020-03-05 DIAGNOSIS — Z01419 Encounter for gynecological examination (general) (routine) without abnormal findings: Secondary | ICD-10-CM | POA: Diagnosis not present

## 2020-03-05 DIAGNOSIS — Z1231 Encounter for screening mammogram for malignant neoplasm of breast: Secondary | ICD-10-CM | POA: Diagnosis not present

## 2020-04-05 DIAGNOSIS — E782 Mixed hyperlipidemia: Secondary | ICD-10-CM | POA: Diagnosis not present

## 2020-04-05 DIAGNOSIS — I2510 Atherosclerotic heart disease of native coronary artery without angina pectoris: Secondary | ICD-10-CM | POA: Diagnosis not present

## 2020-04-05 DIAGNOSIS — G603 Idiopathic progressive neuropathy: Secondary | ICD-10-CM | POA: Diagnosis not present

## 2020-04-05 DIAGNOSIS — R531 Weakness: Secondary | ICD-10-CM | POA: Diagnosis not present

## 2020-04-05 DIAGNOSIS — Z0000 Encounter for general adult medical examination without abnormal findings: Secondary | ICD-10-CM | POA: Diagnosis not present

## 2020-04-09 ENCOUNTER — Ambulatory Visit: Payer: Medicare HMO | Admitting: Cardiology

## 2020-04-12 DIAGNOSIS — N2889 Other specified disorders of kidney and ureter: Secondary | ICD-10-CM | POA: Diagnosis not present

## 2020-04-12 DIAGNOSIS — I2510 Atherosclerotic heart disease of native coronary artery without angina pectoris: Secondary | ICD-10-CM | POA: Diagnosis not present

## 2020-04-12 DIAGNOSIS — E782 Mixed hyperlipidemia: Secondary | ICD-10-CM | POA: Diagnosis not present

## 2020-04-12 DIAGNOSIS — N3941 Urge incontinence: Secondary | ICD-10-CM | POA: Diagnosis not present

## 2020-04-12 DIAGNOSIS — I700 Atherosclerosis of aorta: Secondary | ICD-10-CM | POA: Diagnosis not present

## 2020-04-12 DIAGNOSIS — R531 Weakness: Secondary | ICD-10-CM | POA: Diagnosis not present

## 2020-04-12 DIAGNOSIS — M150 Primary generalized (osteo)arthritis: Secondary | ICD-10-CM | POA: Diagnosis not present

## 2020-04-12 DIAGNOSIS — Z0000 Encounter for general adult medical examination without abnormal findings: Secondary | ICD-10-CM | POA: Diagnosis not present

## 2020-04-12 DIAGNOSIS — G603 Idiopathic progressive neuropathy: Secondary | ICD-10-CM | POA: Diagnosis not present

## 2020-04-12 DIAGNOSIS — E039 Hypothyroidism, unspecified: Secondary | ICD-10-CM | POA: Diagnosis not present

## 2020-04-13 NOTE — Progress Notes (Deleted)
Follow up visit  Subjective:   [name], female    DOB: 06-23-1941, 79 y.o.   MRN: [id number]    *** HPI  No chief complaint on file.   79 y.o. Caucasian female with hypertension, stable renal cell carcinoma, arthritis, Graves' disease status post ablation, with exertional dyspnea.  The patient presents today for***   ***Previous Visit on 09/19/2019: Exercise treadmill stress test negative for ischemia in 09/2018. with low evercise capacity. Echocardiogram realtively unremarkable in 10/2018. I felt her dyspnea was due to uncontrolled hypertension and deconditioning.   She continues to have exertional dyspnea, and is progressively worse. She also complains of bilateral elg pain, both at rest and exertion. Blood pressure is now well controlled.***  *** Current Outpatient Medications on File Prior to Visit  Medication Sig Dispense Refill  . gabapentin (NEURONTIN) 100 MG capsule TAKE 1 CAPSULE BY MOUTH ONCE DAILY FOR 14 DAYS THEN INCREASE TO 2 CAPSULES NIGHTLY AFTER EVENING MEAL    . lisinopril (ZESTRIL) 10 MG tablet Take 1 tablet by mouth once daily 90 tablet 1  . lovastatin (MEVACOR) 20 MG tablet at bedtime.   6  . Multiple Vitamin (MULTIVITAMIN WITH MINERALS) TABS tablet Take 1 tablet by mouth daily.    . NON FORMULARY CBD Gummies    . SYNTHROID 88 MCG tablet 88 mcg daily.      No current facility-administered medications on file prior to visit.    Cardiovascular & other pertient studies:  ***ABI 01/16/2020:  This exam reveals normal perfusion of the right and left lower extremity  (ABI 1.00). Normal multiphasic waveforms at bilateral ankles.   ***Lexiscan Myoview stress test 10/24/2019: No previous exam available for comparison. Lexiscan nuclear stress test performed using 1-day protocol.  Stress EKG is non-diagnostic, as this is pharmacological stress test. Myocardial perfusion imaging is normal.  Stress LVEF is 63% with normal wall motion.  Low risk  study.  Echocardiogram 09/30/2019:  Left ventricle cavity is normal in size. Normal left ventricular wall  thickness. Normal LV systolic function with EF 59%. Normal global wall  motion. Normal diastolic filling pattern, normal LAP.  Trileaflet aortic valve. Trace aortic stenosis. No regurgitation noted.  Inadequate TR jet to estimate pulmonary artery systolic pressure. Normal  right atrial pressure.   No significant change compared to previous study on 10/28/2018.  EKG 08/11/2019: Sinus rhythm 72 bpm. Normal EKG.    Recent labs: 08/24/2020: Glucose 116, BUN/Cr 14/0.77. EGFR 76. Na/K 142/4.1 ***Rest of the CMP normal  11/22/2018: Glucose 95. BUN/Cr 13/0.73. eGFR 81. Na/K 142/5.2  *** H/H ***/***. MCV ***. Platelets *** ***HbA1C ***% Chol ***, TG ***, HDL ***, LDL *** ***TSH ***normal   *** Review of Systems  Cardiovascular: Positive for dyspnea on exertion (stable). Negative for chest pain, leg swelling, palpitations and syncope.  Musculoskeletal: Negative for myalgias.       Bilateral leg pain   Neurological: Positive for paresthesias (Burning feet). Negative for dizziness.       *** There were no vitals filed for this visit.  There is no height or weight on file to calculate BMI. There were no vitals filed for this visit.  *** Objective:   Physical Exam  Constitutional: No distress.  Neck: No JVD present.  Cardiovascular: Normal rate, regular rhythm, normal heart sounds and intact distal pulses.  No murmur heard. Pulmonary/Chest: Effort normal and breath sounds normal. She has no wheezes. She has no rales.  Musculoskeletal:        General:  No edema.  Nursing note and vitals reviewed.         Assessment & Recommendations:    ***   ***79 year old Caucasian female with hypertension, stable renal cell carcinoma, arthritis, Graves' disease status post ablation, seen for exertional dyspnea.  Exertional dyspnea: Worsening. I do think her symptoms are  multifactorial. Will repeat echocardiogram and obtain exercise/Lexiscan nuclear stress test.   Bilateral leg pain, paresthesias: Vascular exam is unremarkable. Will obtain ABI. If this is normal, suspect neuropathy.   Hypertension: Well controlled on lisinopril 10 mg.  Virtual f/u after above tests. ***  [place], MD [facility] Pager: [phone] Office: [phone] If no answer Cell [phone]

## 2020-04-16 ENCOUNTER — Ambulatory Visit: Payer: Medicare HMO | Admitting: Cardiology

## 2020-04-16 ENCOUNTER — Telehealth: Payer: Self-pay | Admitting: Neurology

## 2020-04-16 NOTE — Telephone Encounter (Signed)
Phone rep checked office voicemail's,at 9:04 pt left voicemail stating she is having bad leg weakness and is having difficulty walking.  Phone rep offered her next available appointment with Judson Roch, NP, wait list a message to RN(to see if anything could be called in or suggested while waiting to be seen).  Pt accepted, please call

## 2020-04-16 NOTE — Telephone Encounter (Signed)
I called patient about needing some medication for her leg weakness.She has not fallen at all or use any cane or walker to ambulate. For the past couple of weeks she has been having pain in both legs and sometimes have problems getting off the toilet.I ask pt was she taking still taking the gabapentin. Pt stated she was unaware of the medication and is not taking it.Pt has an appt with Judson Roch NP on May 19. She wanted to know can something be sent to the pharmacy. I stated message will be sent to Dr. Jannifer Franklin.She verbalized  Understanding.

## 2020-04-16 NOTE — Telephone Encounter (Signed)
I called the patient.  She has had a chronic issue with lower extremity weakness but she believes that it has worsened over the last several months.  She does have occasional left buttock pain but no significant discomfort down the legs, her main complaint is proximal muscle weakness.  She has had MRI of the lumbar spine done in 2018 that did not show a definite etiology, EMG at that time as well did not show evidence of a myopathic disorder.  She has renal cell carcinoma that is being followed, she may require repeat MRI of the thoracic and lumbar spine and a repeat EMG nerve conduction study.  She is on lovastatin, blood work will need to be done looking at muscle enzyme levels.  She will be seen within the next 3 weeks here for an evaluation.

## 2020-04-23 ENCOUNTER — Ambulatory Visit: Payer: Medicare HMO | Admitting: Cardiology

## 2020-04-25 DIAGNOSIS — Z809 Family history of malignant neoplasm, unspecified: Secondary | ICD-10-CM | POA: Diagnosis not present

## 2020-04-25 DIAGNOSIS — M1990 Unspecified osteoarthritis, unspecified site: Secondary | ICD-10-CM | POA: Diagnosis not present

## 2020-04-25 DIAGNOSIS — Z85528 Personal history of other malignant neoplasm of kidney: Secondary | ICD-10-CM | POA: Diagnosis not present

## 2020-04-25 DIAGNOSIS — E785 Hyperlipidemia, unspecified: Secondary | ICD-10-CM | POA: Diagnosis not present

## 2020-04-25 DIAGNOSIS — G8929 Other chronic pain: Secondary | ICD-10-CM | POA: Diagnosis not present

## 2020-04-25 DIAGNOSIS — E039 Hypothyroidism, unspecified: Secondary | ICD-10-CM | POA: Diagnosis not present

## 2020-04-25 DIAGNOSIS — R32 Unspecified urinary incontinence: Secondary | ICD-10-CM | POA: Diagnosis not present

## 2020-04-25 DIAGNOSIS — I739 Peripheral vascular disease, unspecified: Secondary | ICD-10-CM | POA: Diagnosis not present

## 2020-04-25 DIAGNOSIS — K5900 Constipation, unspecified: Secondary | ICD-10-CM | POA: Diagnosis not present

## 2020-04-25 DIAGNOSIS — I10 Essential (primary) hypertension: Secondary | ICD-10-CM | POA: Diagnosis not present

## 2020-05-09 ENCOUNTER — Encounter: Payer: Self-pay | Admitting: Neurology

## 2020-05-09 ENCOUNTER — Other Ambulatory Visit: Payer: Self-pay

## 2020-05-09 ENCOUNTER — Ambulatory Visit: Payer: Medicare HMO | Admitting: Neurology

## 2020-05-09 VITALS — BP 142/70 | HR 68 | Ht 62.0 in | Wt 168.0 lb

## 2020-05-09 DIAGNOSIS — M6281 Muscle weakness (generalized): Secondary | ICD-10-CM | POA: Diagnosis not present

## 2020-05-09 DIAGNOSIS — R269 Unspecified abnormalities of gait and mobility: Secondary | ICD-10-CM | POA: Diagnosis not present

## 2020-05-09 DIAGNOSIS — R413 Other amnesia: Secondary | ICD-10-CM | POA: Diagnosis not present

## 2020-05-09 NOTE — Patient Instructions (Signed)
It was nice to meet you today! I will order nerve conduction today  I will get blood work today See you back in 3 months

## 2020-05-09 NOTE — Progress Notes (Addendum)
PATIENT: [name] DOB: [birth year]  REASON FOR VISIT: follow up HISTORY FROM: patient  HISTORY OF PRESENT ILLNESS: Today 05/09/20  [name] is a 79 year old female with history of lower extremity weakness for several years. The purpose of today's visit, she feels that her lower extremity weakness has worsened over the last several months, has occasional left buttocks pain, but no significant discomfort in the legs, mainly complains of distal muscle weakness, MRI of lumbar spine in 2018 did not show definite etiology, EMG at that time did not show evidence of a myopathic disorder. She is having trouble going up and down the stairs, lifting her legs, getting in out of the car. She has to use her upper body strength to get up, to complete PT without benefit 3 months ago. Her children have noticed her speech has slowed/softened, her overall gait has slowed.  She has a stage I renal cell carcinoma, is being monitored. She has urinary urgency, saw GYN recently, given medications she said were too expensive.  No longer taking gabapentin. She denies tremor. With memory, is more forgetful.  Her husband brings up the idea of Parkinson's disease.  She presents today for follow-up accompanied by her husband.  HISTORY 10/31/2019 Dr. Jannifer Franklin: Ms. [name] is a 79 year old right-handed white female with a history of lower extremity weakness has been present for a number of years.  The patient is having some sciatica pain currently down the left leg but this has improved with physical therapy, she believes her leg strength is also improved.  The patient is walking a half a mile to a mile a day.  She has difficulty getting up out of a chair or walking upstairs, she has shortness of breath with physical activity, she was seen and evaluated through cardiology but no issues were noted.  The patient believes that her memory has been quite stable over time, she mainly has some troubles with long-term memory, not  short-term memory.  The patient has not wanted to go on any medications for memory in the past, she returns to this office for an evaluation.  REVIEW OF SYSTEMS: Out of a complete 14 system review of symptoms, the patient complains only of the following symptoms, and all other reviewed systems are negative.  Weakness, walking difficulty  ALLERGIES: Allergies  Allergen Reactions  . Bactrim Other (See Comments)    Unsure childhood reaction.  . Erythromycin Hives    Broke out in hives  . Nitrofurantoin Monohyd Macro Rash  . Vicodin [Hydrocodone-Acetaminophen] Rash    HOME MEDICATIONS: Outpatient Medications Prior to Visit  Medication Sig Dispense Refill  . lisinopril (ZESTRIL) 10 MG tablet Take 1 tablet by mouth once daily 90 tablet 1  . lovastatin (MEVACOR) 20 MG tablet at bedtime.   6  . Multiple Vitamin (MULTIVITAMIN WITH MINERALS) TABS tablet Take 1 tablet by mouth daily.    . NON FORMULARY CBD Gummies    . SYNTHROID 88 MCG tablet 88 mcg daily.     Marland Kitchen gabapentin (NEURONTIN) 100 MG capsule TAKE 1 CAPSULE BY MOUTH ONCE DAILY FOR 14 DAYS THEN INCREASE TO 2 CAPSULES NIGHTLY AFTER EVENING MEAL     No facility-administered medications prior to visit.    PAST MEDICAL HISTORY: Past Medical History:  Diagnosis Date  . Cancer [facility])    kidney, seeing dr in [place]  . Grave's disease   . Reflux     PAST SURGICAL HISTORY: Past Surgical History:  Procedure Laterality Date  . ABDOMINAL  HYSTERECTOMY    . SALIVARY GLAND SURGERY    . TRIGGER FINGER RELEASE  2018  . TRIGGER FINGER RELEASE  2018    FAMILY HISTORY: Family History  Problem Relation Age of Onset  . Cancer - Lung Father   . Mental illness Maternal Grandmother     SOCIAL HISTORY: Social History   Socioeconomic History  . Marital status: Married    Spouse name: Lyndle Herrlich  . Number of children: 2  . Years of education: Dental assitant  . Highest education level: Not on file  Occupational History  . Not on file    Tobacco Use  . Smoking status: Former Smoker    Packs/day: 0.25    Years: 20.00    Pack years: 5.00    Types: Cigarettes    Quit date: 05/01/2004    Years since quitting: 16.0  . Smokeless tobacco: Never Used  Substance and Sexual Activity  . Alcohol use: No  . Drug use: No  . Sexual activity: Not on file  Other Topics Concern  . Not on file  Social History Narrative   Lives with husband, Lyndle Herrlich   Caffeine use: Drinks coffee daily   Right handed   Social Determinants of Health   Financial Resource Strain:   . Difficulty of Paying Living Expenses:   Food Insecurity:   . Worried About Charity fundraiser in the Last Year:   . Arboriculturist in the Last Year:   Transportation Needs:   . Film/video editor (Medical):   Marland Kitchen Lack of Transportation (Non-Medical):   Physical Activity:   . Days of Exercise per Week:   . Minutes of Exercise per Session:   Stress:   . Feeling of Stress :   Social Connections:   . Frequency of Communication with Friends and Family:   . Frequency of Social Gatherings with Friends and Family:   . Attends Religious Services:   . Active Member of Clubs or Organizations:   . Attends Archivist Meetings:   Marland Kitchen Marital Status:   Intimate Partner Violence:   . Fear of Current or Ex-Partner:   . Emotionally Abused:   Marland Kitchen Physically Abused:   . Sexually Abused:    PHYSICAL EXAM  Vitals:   05/09/20 1404  BP: (!) 142/70  Pulse: 68  Weight: 168 lb (76.2 kg)  Height: 5\' 2"  (1.575 m)   Body mass index is 30.73 kg/m.  Generalized: Well developed, in no acute distress  MMSE - Mini Mental State Exam 05/09/2020 10/31/2019 09/07/2018  Orientation to time 5 5 4   Orientation to Place 5 5 5   Registration 3 3 3   Attention/ Calculation 5 5 0  Recall 2 2 2   Language- name 2 objects 2 2 2   Language- repeat 1 1 1   Language- follow 3 step command 3 3 3   Language- read & follow direction 1 1 1   Write a sentence 1 1 1   Copy design 1 1 1   Total  score 29 29 23     Neurological examination  Mentation: Alert oriented to time, place, history taking. Follows all commands speech and language fluent, somewhat flat affect, speech is soft, somewhat slowed Cranial nerve II-XII: Pupils were equal round reactive to light. Extraocular movements were full, visual field were full on confrontational test. Facial sensation and strength were normal. Head turning and shoulder shrug  were normal and symmetric. Motor: Good strength of upper extremities, with lower extremities, 3.5 weakness of right and left  hip flexion, no tremor noted, no rigidity, slight bradykinesia Sensory: Sensory testing is intact to soft touch on all 4 extremities. No evidence of extinction is noted.  Coordination: Cerebellar testing reveals good finger-nose-finger and heel-to-shin bilaterally.  Gait and station: With arms crossed to chest, has to rock to stand, with walking, forward leaning, slight shuffling, decreased arm swing on the left, difficulty with turns Reflexes: Deep tendon reflexes are symmetric but slightly increased throughout  DIAGNOSTIC DATA (LABS, IMAGING, TESTING) - I reviewed patient records, labs, notes, testing and imaging myself where available.  Lab Results  Component Value Date   WBC 11.8 (H) 11/17/2007   HGB 11.0 DELTA CHECK NOTED (L) 11/17/2007   HCT 31.7 (L) 11/17/2007   MCV 96.5 11/17/2007   PLT 296 11/17/2007      Component Value Date/Time   NA 142 08/25/2019 0849   K 4.1 08/25/2019 0849   CL 104 08/25/2019 0849   CO2 24 08/25/2019 0849   GLUCOSE 116 (H) 08/25/2019 0849   BUN 14 08/25/2019 0849   CREATININE 0.77 08/25/2019 0849   CALCIUM 9.5 08/25/2019 0849   PROT 6.6 11/20/2017 1046   ALBUMIN 4.3 11/20/2017 1046   AST 29 11/20/2017 1046   ALT 21 11/20/2017 1046   ALKPHOS 73 11/20/2017 1046   BILITOT 0.3 11/20/2017 1046   GFRNONAA 76 08/25/2019 0849   GFRAA 87 08/25/2019 0849   No results found for: CHOL, HDL, LDLCALC, LDLDIRECT,  TRIG, CHOLHDL No results found for: HGBA1C Lab Results  Component Value Date   VITAMINB12 846 11/20/2017   No results found for: TSH    ASSESSMENT AND PLAN 79 y.o. year old female  has a past medical history of Cancer ([place]), Grave's disease, and Reflux. here with:  1.  Gait disturbance 2.  Bilateral lower extremity weakness 3.  Mild memory disturbance MMSE 29/30  The patient has had a chronic issue of weakness in both legs, over the last 6 months, certainly within the last few months, the issue has worsened.  She does have some parkinsonian features, on exam, I do note a somewhat flat affect, speech is soft, somewhat slow, her gait is forward leaning, mild shuffling, decreased arm swing on the left, and difficult with turns.  I will go ahead and order nerve conduction study, given the worsening weakness in the legs-try to get her in soon.  Will discuss her case with Dr. Jannifer Franklin and alter treatment plan as needed, for now will not add any other medications. I will check CK, CK-MB levels as she is on a statin.  We will closely follow her, I will see her back in the office in 3 months, will be seen before for NCV.  Addendum 05/14/2020 SS: I talked with Dr. Jannifer Franklin, given concern for PD, I will give her a trial on Sinemet 25-100 mg 1/2 tablet 3 times a day, keep NCV study appointment in June with Dr. Jannifer Franklin.   I spent 30 minutes of face-to-face and non-face-to-face time with patient.  This included previsit chart review, lab review, study review, order entry, electronic health record documentation, patient education.  Butler Denmark, AGNP-C, DNP 05/09/2020, 2:16 PM [facility] [address] [place], [place] [address] [phone]

## 2020-05-10 ENCOUNTER — Telehealth: Payer: Self-pay

## 2020-05-10 LAB — CK TOTAL AND CKMB (NOT AT ARMC)
CK-MB Index: 2.6 ng/mL (ref 0.0–5.3)
Total CK: 65 U/L (ref 32–182)

## 2020-05-10 NOTE — Progress Notes (Signed)
I have read the note, and I agree with the clinical assessment and plan.  Emmily Pellegrin K Kezia Benevides   

## 2020-05-10 NOTE — Telephone Encounter (Addendum)
[name] is a 79 y.o. female was contacted and notified of her normal labs.   ----- Message from Suzzanne Cloud, NP sent at 05/10/2020  5:57 AM EDT ----- Labs are normal.

## 2020-05-14 MED ORDER — CARBIDOPA-LEVODOPA 25-100 MG PO TABS: 0.5000 | ORAL_TABLET | Freq: Three times a day (TID) | ORAL | 3 refills | Status: DC

## 2020-05-14 NOTE — Addendum Note (Signed)
Addended by: Suzzanne Cloud on: 05/14/2020 04:54 PM   Modules accepted: Orders

## 2020-05-16 ENCOUNTER — Ambulatory Visit: Payer: Medicare HMO | Admitting: Cardiology

## 2020-05-17 DIAGNOSIS — D225 Melanocytic nevi of trunk: Secondary | ICD-10-CM | POA: Diagnosis not present

## 2020-05-17 DIAGNOSIS — L814 Other melanin hyperpigmentation: Secondary | ICD-10-CM | POA: Diagnosis not present

## 2020-05-17 DIAGNOSIS — L720 Epidermal cyst: Secondary | ICD-10-CM | POA: Diagnosis not present

## 2020-05-17 DIAGNOSIS — D692 Other nonthrombocytopenic purpura: Secondary | ICD-10-CM | POA: Diagnosis not present

## 2020-05-17 DIAGNOSIS — D2271 Melanocytic nevi of right lower limb, including hip: Secondary | ICD-10-CM | POA: Diagnosis not present

## 2020-05-17 DIAGNOSIS — L821 Other seborrheic keratosis: Secondary | ICD-10-CM | POA: Diagnosis not present

## 2020-05-23 ENCOUNTER — Other Ambulatory Visit: Payer: Self-pay | Admitting: Urology

## 2020-05-23 DIAGNOSIS — N2889 Other specified disorders of kidney and ureter: Secondary | ICD-10-CM

## 2020-05-28 ENCOUNTER — Encounter: Payer: Self-pay | Admitting: Cardiology

## 2020-05-28 ENCOUNTER — Other Ambulatory Visit: Payer: Self-pay

## 2020-05-28 ENCOUNTER — Ambulatory Visit: Payer: Medicare HMO | Admitting: Cardiology

## 2020-05-28 VITALS — BP 136/64 | HR 80 | Resp 16 | Ht 62.0 in | Wt 167.0 lb

## 2020-05-28 DIAGNOSIS — R0609 Other forms of dyspnea: Secondary | ICD-10-CM

## 2020-05-28 DIAGNOSIS — R29898 Other symptoms and signs involving the musculoskeletal system: Secondary | ICD-10-CM

## 2020-05-28 DIAGNOSIS — R0600 Dyspnea, unspecified: Secondary | ICD-10-CM

## 2020-05-28 DIAGNOSIS — I10 Essential (primary) hypertension: Secondary | ICD-10-CM

## 2020-05-28 NOTE — Progress Notes (Signed)
Subjective:   [name], female    DOB: 1941/04/02, 79 y.o.   MRN: [id number]   Chief complaint:  Exertional dyspnea   HPI  79 year old Caucasian femalemale with hypertension, stable renal cell carcinoma, arthritis, Graves' disease status post ablation, stable exertional dyspnea, bilateral leg muscle weakness.  Exercise treadmill stress test negative for ischemia in 09/2018. with low evercise capacity. Echocardiogram realtively unremarkable in 10/2018. I felt her dyspnea was due to uncontrolled hypertension and deconditioning.   She continues to have exertional dyspnea, although is stable. More recently, she has had severe proximal leg muscle weakness. She is being worked up by Dr. Jannifer Franklin for the same.    Current Outpatient Medications on File Prior to Visit  Medication Sig Dispense Refill  . carbidopa-levodopa (SINEMET) 25-100 MG tablet Take 0.5 tablets by mouth 3 (three) times daily. 90 tablet 3  . lisinopril (ZESTRIL) 10 MG tablet Take 1 tablet by mouth once daily 90 tablet 1  . lovastatin (MEVACOR) 20 MG tablet at bedtime.   6  . Multiple Vitamin (MULTIVITAMIN WITH MINERALS) TABS tablet Take 1 tablet by mouth daily.    . NON FORMULARY CBD Gummies    . SYNTHROID 88 MCG tablet 88 mcg daily.      No current facility-administered medications on file prior to visit.    Cardiovascular studies:  EKG 05/28/2020:  Sinus rhythm 83 bpm Normal EKG  ABI 01/16/2020:  This exam reveals normal perfusion of the right and left lower extremity  (ABI 1.00). Normal multiphasic waveforms at bilateral ankles.   Lexiscan Myoview stress test 10/24/2019: No previous exam available for comparison. Lexiscan nuclear stress test performed using 1-day protocol.  Stress EKG is non-diagnostic, as this is pharmacological stress test. Myocardial perfusion imaging is normal.  Stress LVEF is 63% with normal wall motion.  Low risk study.  Echocardiogram 09/30/2019:  Left ventricle cavity is normal in  size. Normal left ventricular wall  thickness. Normal LV systolic function with EF 59%. Normal global wall  motion. Normal diastolic filling pattern, normal LAP.  Trileaflet aortic valve. Trace aortic stenosis. No regurgitation noted.  Inadequate TR jet to estimate pulmonary artery systolic pressure. Normal  right atrial pressure.   No significant change compared to previous study on 10/28/2018.  Recent labs: 08/25/2019: Glucose 116, BUN/Cr 14/0.77. EGFR 76. Na/K 142/4.1. Rest of the CMP normal   Review of Systems  Cardiovascular: Positive for dyspnea on exertion. Negative for chest pain, leg swelling, palpitations and syncope.  Neurological: Positive for focal weakness (Bilateral legs).       Today's Vitals   05/28/20 1549  BP: 136/64  Pulse: 80  Resp: 16  SpO2: 96%  Weight: 167 lb (75.8 kg)  Height: _0  (1.575 m)   Body mass index is 30.54 kg/m.   Physical Exam  Constitutional: No distress.  Neck: No JVD present.  Cardiovascular: Normal rate, regular rhythm, normal heart sounds and intact distal pulses.  No murmur heard. Pulmonary/Chest: Effort normal and breath sounds normal. She has no wheezes. She has no rales.  Musculoskeletal:        General: No edema.  Neurological:  Bilateral proximal leg weakness  Nursing note and vitals reviewed.       Assessment & Recommendations:    79 year old Caucasian femaleemale with hypertension, stable renal cell carcinoma, arthritis, Graves' disease status post ablation, stable exertional dyspnea, bilateral leg muscle weakness.  Exertional dyspnea: Stable. No specific cardiac etiology identified.Suspect deconditioning.  Bilateral leg pain, paresthesias: Vascular exam  and ABI. Normal CK. Ongoing neurology workup. Pending nerve conduction testing. Has h/o renal cell carcinoma. Paraneoplastic syndromes would be in the differential. Defer workup to Neurology.  Hypertension: Well controlled on lisinopril 10 mg.  I will see her on  as needed basis.  Nigel Mormon, MD [facility] Pager: [phone] Office: [phone] If no answer Cell [phone]

## 2020-06-01 ENCOUNTER — Other Ambulatory Visit: Payer: Self-pay

## 2020-06-01 ENCOUNTER — Ambulatory Visit
Admission: RE | Admit: 2020-06-01 | Discharge: 2020-06-01 | Disposition: A | Discharge status: Home / Self Care | Payer: Medicare HMO | Source: Ambulatory Visit | Attending: Urology | Admitting: Urology

## 2020-06-01 DIAGNOSIS — C641 Malignant neoplasm of right kidney, except renal pelvis: Secondary | ICD-10-CM | POA: Diagnosis not present

## 2020-06-01 DIAGNOSIS — N2889 Other specified disorders of kidney and ureter: Secondary | ICD-10-CM

## 2020-06-01 MED ORDER — IOPAMIDOL (ISOVUE-300) INJECTION 61%
100.0000 mL | Freq: Once | INTRAVENOUS | Status: AC | PRN
  Administered 2020-06-01: 100 mL via INTRAVENOUS

## 2020-06-07 DIAGNOSIS — N2889 Other specified disorders of kidney and ureter: Secondary | ICD-10-CM | POA: Diagnosis not present

## 2020-06-07 DIAGNOSIS — C649 Malignant neoplasm of unspecified kidney, except renal pelvis: Secondary | ICD-10-CM | POA: Diagnosis not present

## 2020-06-11 ENCOUNTER — Ambulatory Visit (INDEPENDENT_AMBULATORY_CARE_PROVIDER_SITE_OTHER): Payer: Medicare HMO | Admitting: Neurology

## 2020-06-11 ENCOUNTER — Other Ambulatory Visit: Payer: Self-pay

## 2020-06-11 ENCOUNTER — Encounter: Payer: Self-pay | Admitting: Neurology

## 2020-06-11 ENCOUNTER — Ambulatory Visit: Payer: Medicare HMO | Admitting: Neurology

## 2020-06-11 DIAGNOSIS — M6281 Muscle weakness (generalized): Secondary | ICD-10-CM | POA: Diagnosis not present

## 2020-06-11 MED ORDER — CARBIDOPA-LEVODOPA ER 25-100 MG PO TBCR: 1.0000 | EXTENDED_RELEASE_TABLET | Freq: Three times a day (TID) | ORAL | 1 refills | Status: DC

## 2020-06-11 NOTE — Procedures (Signed)
     HISTORY:  [name] is a 79 year old patient with bilateral leg weakness, proximal. She is having increasing difficulty getting up from a chair or getting up into a car. She is doing leg exercises. She reports some numbness and discomfort in the feet, worse at night. She is being evaluated for a possible neuropathy or a radiculopathy.  NERVE CONDUCTION STUDIES:  Nerve conduction studies were performed on both lower extremities. The distal motor latencies and motor amplitudes for the peroneal and posterior tibial nerves were within normal limits. The nerve conduction velocities for these nerves were also normal. The sensory latencies for the peroneal and sural nerves were within normal limits. The F wave latencies for the posterior tibial nerves were within normal limits.   EMG STUDIES:  EMG study was performed on the right lower extremity:  The tibialis anterior muscle reveals 2 to 4K motor units with full recruitment. No fibrillations or positive waves were seen. The peroneus tertius muscle reveals 2 to 4K motor units with full recruitment. No fibrillations or positive waves were seen. The medial gastrocnemius muscle reveals 1 to 3K motor units with full recruitment. No fibrillations or positive waves were seen. The vastus lateralis muscle reveals 2 to 4K motor units with full recruitment. No fibrillations or positive waves were seen. The iliopsoas muscle reveals 2 to 4K motor units with full recruitment. No fibrillations or positive waves were seen. The biceps femoris muscle (long head) reveals 2 to 4K motor units with full recruitment. No fibrillations or positive waves were seen. The lumbosacral paraspinal muscles were tested at 3 levels, and revealed no abnormalities of insertional activity at all 3 levels tested. There was good relaxation.   IMPRESSION:  Nerve conduction studies done on both lower extremities were unremarkable, no evidence of a peripheral neuropathy was  seen. Cannot exclude a small fiber neuropathy through this study. EMG evaluation of the right lower extremity was unremarkable, there is no evidence of an overlying lumbosacral radiculopathy or a myopathic disorder.  Jill Alexanders MD 06/11/2020 2:29 PM  [facility] [address] [place], [place] [address]  Phone [phone] Fax [phone]

## 2020-06-11 NOTE — Progress Notes (Addendum)
The patient comes in today for for EMG evaluation. She is reporting some discomfort in the feet at night. She has proximal leg weakness, she has to pull her leg up in order to get into her car. She has children who have noticed that she is walking differently, more slow, her voice is more soft. She was placed on Sinemet in low-dose empirically, she does not believe that it has helped, she is having some dizziness on her current dosing. The patient has not had any falls, she uses a cane for ambulation.  EMG of the right leg today was unremarkable, nerve conductions do not show a neuropathy, cannot exclude the possibility of a small fiber neuropathy.  We will try going up to the full dose of Sinemet CR 25/100 mg tablet, 1 tablet 3 times daily. If she has not gained any benefit within the next 3 to 4 weeks she will stop the medication.  Watching her walk today, she has good stride, good armswing, I would not say that she has any definite parkinsonian features.      [place]    Nerve / Sites Muscle Latency Ref. Amplitude Ref. Rel Amp Segments Distance Velocity Ref. Area    ms ms mV mV %  cm m/s m/s mVms  R Peroneal - EDB     Ankle EDB 4.4 ?6.5 4.5 ?2.0 100 Ankle - EDB 9   12.2     Fib head EDB 9.9  3.9  87.9 Fib head - Ankle 26 47 ?44 11.6     Pop fossa EDB 12.0  3.7  95.2 Pop fossa - Fib head 10 48 ?44 10.8         Pop fossa - Ankle      L Peroneal - EDB     Ankle EDB 4.3 ?6.5 5.8 ?2.0 100 Ankle - EDB 9   16.7     Fib head EDB 10.2  5.1  88.4 Fib head - Ankle 26 44 ?44 15.6     Pop fossa EDB 12.4  4.8  94.3 Pop fossa - Fib head 10 44 ?44 15.4         Pop fossa - Ankle      R Tibial - AH     Ankle AH 3.4 ?5.8 4.9 ?4.0 100 Ankle - AH 9   10.0     Pop fossa AH 11.5  2.8  58.2 Pop fossa - Ankle 33 41 ?41 10.5  L Tibial - AH     Ankle AH 4.1 ?5.8 6.5 ?4.0 100 Ankle - AH 9   10.5     Pop fossa AH 11.8  3.3  51 Pop fossa - Ankle 35 46 ?41 11.6             SNC    Nerve / Sites Rec. Site Peak Lat  Ref.  Amp Ref. Segments Distance    ms ms V V  cm  R Sural - Ankle (Calf)     Calf Ankle 3.3 ?4.4 10 ?6 Calf - Ankle 14  L Sural - Ankle (Calf)     Calf Ankle 3.5 ?4.4 14 ?6 Calf - Ankle 14  R Superficial peroneal - Ankle     Lat leg Ankle 4.0 ?4.4 6 ?6 Lat leg - Ankle 14  L Superficial peroneal - Ankle     Lat leg Ankle 4.1 ?4.4 5 ?6 Lat leg - Ankle 14             F  Wave    Nerve F Lat Ref.   ms ms  R Tibial - AH 45.6 ?56.0  L Tibial - AH 46.5 ?56.0

## 2020-06-11 NOTE — Progress Notes (Signed)
Please refer to EMG and nerve conduction procedure note.  

## 2020-06-12 DIAGNOSIS — M5432 Sciatica, left side: Secondary | ICD-10-CM | POA: Diagnosis not present

## 2020-06-12 DIAGNOSIS — N3941 Urge incontinence: Secondary | ICD-10-CM | POA: Diagnosis not present

## 2020-06-12 DIAGNOSIS — G603 Idiopathic progressive neuropathy: Secondary | ICD-10-CM | POA: Diagnosis not present

## 2020-06-20 DIAGNOSIS — H52203 Unspecified astigmatism, bilateral: Secondary | ICD-10-CM | POA: Diagnosis not present

## 2020-06-20 DIAGNOSIS — H532 Diplopia: Secondary | ICD-10-CM | POA: Diagnosis not present

## 2020-06-20 DIAGNOSIS — H00015 Hordeolum externum left lower eyelid: Secondary | ICD-10-CM | POA: Diagnosis not present

## 2020-06-20 DIAGNOSIS — H43813 Vitreous degeneration, bilateral: Secondary | ICD-10-CM | POA: Diagnosis not present

## 2020-07-05 DIAGNOSIS — Z03818 Encounter for observation for suspected exposure to other biological agents ruled out: Secondary | ICD-10-CM | POA: Diagnosis not present

## 2020-07-05 DIAGNOSIS — Z20822 Contact with and (suspected) exposure to covid-19: Secondary | ICD-10-CM | POA: Diagnosis not present

## 2020-07-10 DIAGNOSIS — Z20822 Contact with and (suspected) exposure to covid-19: Secondary | ICD-10-CM | POA: Diagnosis not present

## 2020-07-11 ENCOUNTER — Other Ambulatory Visit: Payer: Self-pay | Admitting: Physician Assistant

## 2020-07-11 DIAGNOSIS — U071 COVID-19: Secondary | ICD-10-CM

## 2020-07-11 DIAGNOSIS — Z20822 Contact with and (suspected) exposure to covid-19: Secondary | ICD-10-CM | POA: Diagnosis not present

## 2020-07-11 DIAGNOSIS — I10 Essential (primary) hypertension: Secondary | ICD-10-CM

## 2020-07-11 MED ORDER — SODIUM CHLORIDE 0.9 % IV SOLN
Freq: Once | INTRAVENOUS | Status: AC
  Filled 2020-07-11: qty 5
  Filled 2020-07-11: qty 600

## 2020-07-11 NOTE — Progress Notes (Signed)
I connected by phone with [name] on 07/11/2020 at 12:58 PM to discuss the potential use of a new treatment for mild to moderate COVID-19 viral infection in non-hospitalized patients.  This patient is a 79 y.o. female that meets the FDA criteria for Emergency Use Authorization of COVID monoclonal antibody casirivimab/imdevimab.  Has a (+) direct SARS-CoV-2 viral test result  Has mild or moderate COVID-19   Is NOT hospitalized due to COVID-19  Is within 10 days of symptom onset  Has at least one of the high risk factor(s) for progression to severe COVID-19 and/or hospitalization as defined in EUA.  Specific high risk criteria : Older age (>/= 77 yo), HTN, renal cancer   I have spoken and communicated the following to the patient or parent/caregiver regarding COVID monoclonal antibody treatment:  1. FDA has authorized the emergency use for the treatment of mild to moderate COVID-19 in adults and pediatric patients with positive results of direct SARS-CoV-2 viral testing who are 21 years of age and older weighing at least 40 kg, and who are at high risk for progressing to severe COVID-19 and/or hospitalization.  2. The significant known and potential risks and benefits of COVID monoclonal antibody, and the extent to which such potential risks and benefits are unknown.  3. Information on available alternative treatments and the risks and benefits of those alternatives, including clinical trials.  4. Patients treated with COVID monoclonal antibody should continue to self-isolate and use infection control measures (e.g., wear mask, isolate, social distance, avoid sharing personal items, clean and disinfect high touch surfaces, and frequent handwashing) according to CDC guidelines.   5. The patient or parent/caregiver has the option to accept or refuse COVID monoclonal antibody treatment.  After reviewing this information with the patient, The patient agreed to proceed with receiving  casirivimab\imdevimab infusion and will be provided a copy of the Fact sheet prior to receiving the infusion.   Sx onset 7/19. Set up for infusion tomorrow 7/22 @ 12:30pm. Tested at [facility] with rapid test today.  Angelena Form 07/11/2020 12:58 PM

## 2020-07-12 ENCOUNTER — Ambulatory Visit (HOSPITAL_COMMUNITY)
Admission: RE | Admit: 2020-07-12 | Discharge: 2020-07-12 | Disposition: A | Discharge status: Home / Self Care | Payer: Medicare Other | Source: Ambulatory Visit | Attending: Pulmonary Disease | Admitting: Pulmonary Disease

## 2020-07-12 DIAGNOSIS — U071 COVID-19: Secondary | ICD-10-CM

## 2020-07-12 DIAGNOSIS — Z23 Encounter for immunization: Secondary | ICD-10-CM | POA: Diagnosis not present

## 2020-07-12 DIAGNOSIS — I10 Essential (primary) hypertension: Secondary | ICD-10-CM

## 2020-07-12 MED ORDER — FAMOTIDINE IN NACL 20-0.9 MG/50ML-% IV SOLN: 20.0000 mg | Freq: Once | INTRAVENOUS | Status: DC | PRN

## 2020-07-12 MED ORDER — SODIUM CHLORIDE 0.9 % IV SOLN: INTRAVENOUS | Status: DC | PRN

## 2020-07-12 MED ORDER — EPINEPHRINE 0.3 MG/0.3ML IJ SOAJ: 0.3000 mg | Freq: Once | INTRAMUSCULAR | Status: DC | PRN

## 2020-07-12 MED ORDER — DIPHENHYDRAMINE HCL 50 MG/ML IJ SOLN: 50.0000 mg | Freq: Once | INTRAMUSCULAR | Status: DC | PRN

## 2020-07-12 MED ORDER — SODIUM CHLORIDE 0.9 % IV SOLN: Freq: Once | INTRAVENOUS | Status: DC

## 2020-07-12 MED ORDER — METHYLPREDNISOLONE SODIUM SUCC 125 MG IJ SOLR: 125.0000 mg | Freq: Once | INTRAMUSCULAR | Status: DC | PRN

## 2020-07-12 MED ORDER — ALBUTEROL SULFATE HFA 108 (90 BASE) MCG/ACT IN AERS: 2.0000 | INHALATION_SPRAY | Freq: Once | RESPIRATORY_TRACT | Status: DC | PRN

## 2020-07-12 NOTE — Discharge Instructions (Signed)

## 2020-07-12 NOTE — Progress Notes (Signed)
°  Diagnosis: COVID-19  Physician:Dr Joya Gaskins  Procedure: [facility] Med: casirivimab\imdevimab infusion - Provided patient with casirivimab\imdevimab fact sheet for patients, parents and caregivers prior to infusion.  Complications: No immediate complications noted.  Discharge: Discharged home   [place], [place] 07/12/2020

## 2020-08-16 ENCOUNTER — Encounter: Payer: Self-pay | Admitting: Neurology

## 2020-08-16 ENCOUNTER — Ambulatory Visit: Payer: Medicare HMO | Admitting: Neurology

## 2020-08-16 VITALS — BP 135/70 | HR 74 | Ht 62.0 in | Wt 163.0 lb

## 2020-08-16 DIAGNOSIS — M6281 Muscle weakness (generalized): Secondary | ICD-10-CM

## 2020-08-16 DIAGNOSIS — R269 Unspecified abnormalities of gait and mobility: Secondary | ICD-10-CM

## 2020-08-16 NOTE — Progress Notes (Signed)
I have read the note, and I agree with the clinical assessment and plan.  Channing Savich K Tyronn Golda   

## 2020-08-16 NOTE — Progress Notes (Signed)
PATIENT: [name] DOB: Mar 02, 1941  REASON FOR VISIT: follow up HISTORY FROM: patient  HISTORY OF PRESENT ILLNESS: Today 08/16/20 [name] is a 79 year old female with history of lower extremity weakness for several years.    MRI of lumbar spine in 2018 did not show any definitive etiology, EMG at that time did not show evidence of a myopathic disorder.   She has had some mild Parkinson's features on exam, she was trialed on Sinemet up to 25-100 mg 1 tablet 3 times daily-had benefit whatsoever  EMG/NCV was repeated in June 2021, no neuropathy was seen, cannot exclude the possibility of a small fiber neuropathy, EMG of the right leg was unremarkable.  Continues with chronic sensation of proximal lower extremity weakness, trouble standing, has to use her arms to push or pull herself, when getting in and out of the car, has to pick her legs up.  No falls, using cane.  Feels sensation of cardboard to bottom of feet, taking Tylenol.  Previously tried physical therapy without benefit.  Has known right kidney cancer, is monitoring, is slow growing.  The sensation of muscle weakness has been present since 2018. B & B are stable.  Intermittent low back pain, radiating down the right leg. Does home exercises to strengthen her legs. Here today for follow-up unaccompanied, her choice, her husband was in the lobby.  HISTORY  05/09/2020 SS: [name] is a 79 year old female with history of lower extremity weakness for several years. The purpose of today's visit, she feels that her lower extremity weakness has worsened over the last several months, has occasional left buttocks pain, but no significant discomfort in the legs, mainly complains of distal muscle weakness, MRI of lumbar spine in 2018 did not show definite etiology, EMG at that time did not show evidence of a myopathic disorder. She is having trouble going up and down the stairs, lifting her legs, getting in out of the car. She has to  use her upper body strength to get up, to complete PT without benefit 3 months ago. Her children have noticed her speech has slowed/softened, her overall gait has slowed.  She has a stage I renal cell carcinoma, is being monitored. She has urinary urgency, saw GYN recently, given medications she said were too expensive.  No longer taking gabapentin. She denies tremor. With memory, is more forgetful.  Her husband brings up the idea of Parkinson's disease.  She presents today for follow-up accompanied by her husband.  REVIEW OF SYSTEMS: Out of a complete 14 system review of symptoms, the patient complains only of the following symptoms, and all other reviewed systems are negative.  Weakness  ALLERGIES: Allergies  Allergen Reactions  . Bactrim Other (See Comments)    Unsure childhood reaction.  . Erythromycin Hives    Broke out in hives  . Nitrofurantoin Monohyd Macro Rash  . Vicodin [Hydrocodone-Acetaminophen] Rash    HOME MEDICATIONS: Outpatient Medications Prior to Visit  Medication Sig Dispense Refill  . lisinopril (ZESTRIL) 10 MG tablet Take 1 tablet by mouth once daily 90 tablet 1  . lovastatin (MEVACOR) 20 MG tablet at bedtime.   6  . Multiple Vitamin (MULTIVITAMIN WITH MINERALS) TABS tablet Take 1 tablet by mouth daily.    . NON FORMULARY CBD Gummies    . SYNTHROID 88 MCG tablet 88 mcg daily.     . Carbidopa-Levodopa ER (SINEMET CR) 25-100 MG tablet controlled release Take 1 tablet by mouth in the morning, at noon, and  at bedtime. 90 tablet 1   No facility-administered medications prior to visit.    PAST MEDICAL HISTORY: Past Medical History:  Diagnosis Date  . Cancer [facility])    kidney, seeing dr in [place]  . Grave's disease   . Reflux     PAST SURGICAL HISTORY: Past Surgical History:  Procedure Laterality Date  . ABDOMINAL HYSTERECTOMY    . SALIVARY GLAND SURGERY    . TRIGGER FINGER RELEASE  2018  . TRIGGER FINGER RELEASE  2018    FAMILY HISTORY: Family  History  Problem Relation Age of Onset  . Cancer - Lung Father   . Mental illness Maternal Grandmother     SOCIAL HISTORY: Social History   Socioeconomic History  . Marital status: Married    Spouse name: Lyndle Herrlich  . Number of children: 2  . Years of education: Dental assitant  . Highest education level: Not on file  Occupational History  . Not on file  Tobacco Use  . Smoking status: Former Smoker    Packs/day: 0.25    Years: 20.00    Pack years: 5.00    Types: Cigarettes    Quit date: 05/01/2004    Years since quitting: 16.3  . Smokeless tobacco: Never Used  Vaping Use  . Vaping Use: Never used  Substance and Sexual Activity  . Alcohol use: No  . Drug use: No  . Sexual activity: Not on file  Other Topics Concern  . Not on file  Social History Narrative   Lives with husband, Lyndle Herrlich   Caffeine use: Drinks coffee daily   Right handed   Social Determinants of Health   Financial Resource Strain:   . Difficulty of Paying Living Expenses: Not on file  Food Insecurity:   . Worried About Charity fundraiser in the Last Year: Not on file  . Ran Out of Food in the Last Year: Not on file  Transportation Needs:   . Lack of Transportation (Medical): Not on file  . Lack of Transportation (Non-Medical): Not on file  Physical Activity:   . Days of Exercise per Week: Not on file  . Minutes of Exercise per Session: Not on file  Stress:   . Feeling of Stress : Not on file  Social Connections:   . Frequency of Communication with Friends and Family: Not on file  . Frequency of Social Gatherings with Friends and Family: Not on file  . Attends Religious Services: Not on file  . Active Member of Clubs or Organizations: Not on file  . Attends Archivist Meetings: Not on file  . Marital Status: Not on file  Intimate Partner Violence:   . Fear of Current or Ex-Partner: Not on file  . Emotionally Abused: Not on file  . Physically Abused: Not on file  . Sexually Abused: Not  on file   PHYSICAL EXAM  Vitals:   08/16/20 1240  BP: 135/70  Pulse: 74  Weight: 163 lb (73.9 kg)  Height: 5\' 2"  (1.575 m)   Body mass index is 29.81 kg/m.  Generalized: Well developed, in no acute distress   Neurological examination  Mentation: Alert oriented to time, place, history taking. Follows all commands speech and language fluent.  Speech is slightly hoarse. Cranial nerve II-XII: Pupils were equal round reactive to light. Extraocular movements were full, visual field were full on confrontational test. Facial sensation and strength were normal. Head turning and shoulder shrug  were normal and symmetric. Motor: mild bilateral hip flexion  weakness, otherwise, no significant proximal or distal muscle weakness, no tremor Sensory: Sensory testing is intact to soft touch on all 4 extremities. No evidence of extinction is noted.  Coordination: Cerebellar testing reveals good finger-nose-finger and heel-to-shin bilaterally.  Gait and station: Gait is slightly wide-based, but steady, can walk independently, slight decreased arm swing on the left, good turns, good pace Reflexes: Deep tendon reflexes are symmetric and normal bilaterally.   DIAGNOSTIC DATA (LABS, IMAGING, TESTING) - I reviewed patient records, labs, notes, testing and imaging myself where available.  Lab Results  Component Value Date   WBC 11.8 (H) 11/17/2007   HGB 11.0 DELTA CHECK NOTED (L) 11/17/2007   HCT 31.7 (L) 11/17/2007   MCV 96.5 11/17/2007   PLT 296 11/17/2007      Component Value Date/Time   NA 142 08/25/2019 0849   K 4.1 08/25/2019 0849   CL 104 08/25/2019 0849   CO2 24 08/25/2019 0849   GLUCOSE 116 (H) 08/25/2019 0849   BUN 14 08/25/2019 0849   CREATININE 0.77 08/25/2019 0849   CALCIUM 9.5 08/25/2019 0849   PROT 6.6 11/20/2017 1046   ALBUMIN 4.3 11/20/2017 1046   AST 29 11/20/2017 1046   ALT 21 11/20/2017 1046   ALKPHOS 73 11/20/2017 1046   BILITOT 0.3 11/20/2017 1046   GFRNONAA 76  08/25/2019 0849   GFRAA 87 08/25/2019 0849   No results found for: CHOL, HDL, LDLCALC, LDLDIRECT, TRIG, CHOLHDL No results found for: HGBA1C Lab Results  Component Value Date   VITAMINB12 846 11/20/2017   No results found for: TSH    ASSESSMENT AND PLAN 79 y.o. year old female  has a past medical history of Cancer ([place]), Grave's disease, and Reflux. here with:  1.  Gait disturbance 2.  Bilateral lower extremity weakness  -History of chronic bilateral lower extremity weakness since 2018  -No benefit with empiric Sinemet trial 25-100 mg 1 tablet 3 times daily  -Nerve conduction evaluation in June 2021 was unremarkable, no evidence of neuropathy, but cannot exclude the possibility of a small fiber neuropathy  -MRI of lumbar spine in 2018 did not show definite etiology of symptoms, EMG at that time did not show evidence of a myopathic disorder  -CK, CK-MB were normal in May 2021  -Given continued report of muscle weakness, pain to the low back, radiating down the right leg, will recheck MRI of lumbar spine for comparison to 2018 report  -If unremarkable, may consider another course of physical therapy, however she has tried this before, reports not beneficial  -Keep follow-up appointment in November with me, can cancel if needed, will schedule 6 months out with Dr .Jannifer Franklin   I spent 30 minutes of face-to-face and non-face-to-face time with patient.  This included previsit chart review, lab review, study review, order entry, electronic health record documentation, patient education.  Butler Denmark, AGNP-C, DNP 08/16/2020, 1:20 PM [facility] [address] [place], [place] [address] [phone]

## 2020-08-16 NOTE — Patient Instructions (Signed)
Will order repeat MRI of lumbar spine to compare with previous  Continue with your home exercises Can stay off the Sinemet Return in 6 months

## 2020-08-20 ENCOUNTER — Telehealth: Payer: Self-pay | Admitting: Neurology

## 2020-08-20 NOTE — Telephone Encounter (Signed)
[employer] medicare order sent to [facility]. They will obtain the auth and reach out to the patient to schedule.  °

## 2020-08-21 ENCOUNTER — Ambulatory Visit
Admission: RE | Admit: 2020-08-21 | Discharge: 2020-08-21 | Disposition: A | Discharge status: Home / Self Care | Payer: Medicare HMO | Source: Ambulatory Visit | Attending: Neurology | Admitting: Neurology

## 2020-08-21 ENCOUNTER — Other Ambulatory Visit: Payer: Self-pay

## 2020-08-21 DIAGNOSIS — M6281 Muscle weakness (generalized): Secondary | ICD-10-CM

## 2020-08-21 DIAGNOSIS — R269 Unspecified abnormalities of gait and mobility: Secondary | ICD-10-CM | POA: Diagnosis not present

## 2020-08-23 ENCOUNTER — Telehealth: Payer: Self-pay | Admitting: Neurology

## 2020-08-23 DIAGNOSIS — M6281 Muscle weakness (generalized): Secondary | ICD-10-CM

## 2020-08-23 DIAGNOSIS — R269 Unspecified abnormalities of gait and mobility: Secondary | ICD-10-CM

## 2020-08-23 NOTE — Telephone Encounter (Signed)
I called the patient, I reviewed MRI of lumbar spine with Dr. Jannifer Franklin, no etiology for her symptoms of leg weakness noted, no surgical issues.  I will order physical therapy to specifically focus on leg strengthening.  We will continue to follow over time, may consider MRI of cervical spine in the future.

## 2020-09-09 ENCOUNTER — Other Ambulatory Visit: Payer: Self-pay | Admitting: Cardiology

## 2020-09-09 DIAGNOSIS — I10 Essential (primary) hypertension: Secondary | ICD-10-CM

## 2020-09-10 ENCOUNTER — Other Ambulatory Visit: Payer: Medicare HMO

## 2020-09-27 DIAGNOSIS — Z23 Encounter for immunization: Secondary | ICD-10-CM | POA: Diagnosis not present

## 2020-09-27 DIAGNOSIS — M25461 Effusion, right knee: Secondary | ICD-10-CM | POA: Diagnosis not present

## 2020-09-27 DIAGNOSIS — W19XXXA Unspecified fall, initial encounter: Secondary | ICD-10-CM | POA: Diagnosis not present

## 2020-09-27 DIAGNOSIS — R079 Chest pain, unspecified: Secondary | ICD-10-CM | POA: Diagnosis not present

## 2020-09-27 DIAGNOSIS — I708 Atherosclerosis of other arteries: Secondary | ICD-10-CM | POA: Diagnosis not present

## 2020-09-27 DIAGNOSIS — R0781 Pleurodynia: Secondary | ICD-10-CM | POA: Diagnosis not present

## 2020-09-27 DIAGNOSIS — M1711 Unilateral primary osteoarthritis, right knee: Secondary | ICD-10-CM | POA: Diagnosis not present

## 2020-09-27 DIAGNOSIS — S8991XA Unspecified injury of right lower leg, initial encounter: Secondary | ICD-10-CM | POA: Diagnosis not present

## 2020-10-26 DIAGNOSIS — E782 Mixed hyperlipidemia: Secondary | ICD-10-CM | POA: Diagnosis not present

## 2020-10-26 DIAGNOSIS — G603 Idiopathic progressive neuropathy: Secondary | ICD-10-CM | POA: Diagnosis not present

## 2020-10-26 DIAGNOSIS — I2510 Atherosclerotic heart disease of native coronary artery without angina pectoris: Secondary | ICD-10-CM | POA: Diagnosis not present

## 2020-10-26 DIAGNOSIS — I700 Atherosclerosis of aorta: Secondary | ICD-10-CM | POA: Diagnosis not present

## 2020-11-01 DIAGNOSIS — R531 Weakness: Secondary | ICD-10-CM | POA: Diagnosis not present

## 2020-11-01 DIAGNOSIS — R5383 Other fatigue: Secondary | ICD-10-CM | POA: Diagnosis not present

## 2020-11-01 DIAGNOSIS — R269 Unspecified abnormalities of gait and mobility: Secondary | ICD-10-CM | POA: Diagnosis not present

## 2020-11-01 DIAGNOSIS — R29898 Other symptoms and signs involving the musculoskeletal system: Secondary | ICD-10-CM | POA: Diagnosis not present

## 2020-11-05 ENCOUNTER — Encounter: Payer: Self-pay | Admitting: Neurology

## 2020-11-05 ENCOUNTER — Ambulatory Visit: Payer: Medicare HMO | Admitting: Neurology

## 2020-11-05 ENCOUNTER — Telehealth: Payer: Self-pay | Admitting: Neurology

## 2020-11-05 VITALS — BP 128/68 | HR 70 | Ht 62.0 in | Wt 157.8 lb

## 2020-11-05 DIAGNOSIS — R269 Unspecified abnormalities of gait and mobility: Secondary | ICD-10-CM | POA: Diagnosis not present

## 2020-11-05 DIAGNOSIS — M6281 Muscle weakness (generalized): Secondary | ICD-10-CM | POA: Diagnosis not present

## 2020-11-05 NOTE — Addendum Note (Signed)
Addended by: Brandon Melnick on: 11/05/2020 04:45 PM   Modules accepted: Orders

## 2020-11-05 NOTE — Telephone Encounter (Signed)
I called pt and she just got out of PT eval at [facility].  I relayed that was excellent, she was able to get in today (they had a cancellation).  She states it is for gait and balance training and leg strengthening.  Will cancel our order.

## 2020-11-05 NOTE — Telephone Encounter (Signed)
Tried to call busy #

## 2020-11-05 NOTE — Progress Notes (Signed)
I have read the note, and I agree with the clinical assessment and plan.  Clark Clowdus K Taison Celani   

## 2020-11-05 NOTE — Patient Instructions (Signed)
Check MRI cervical spine  Will send for physical therapy  See you back in 6 months

## 2020-11-05 NOTE — Progress Notes (Signed)
PATIENT: [name] DOB: [birth year]  REASON FOR VISIT: follow up HISTORY FROM: patient  HISTORY OF PRESENT ILLNESS: Today 11/05/20  [name] is a 79 year old female with history of lower extremity weakness for several years.  MRI of lumbar spine in August 2021 showed no etiology for leg weakness, no surgical issues.   EMG/NCV was repeated in June 2021, no neuropathy was seen, cannot exclude possibility of small fiber neuropathy, EMG of the right leg was unremarkable.    Has had mild Parkinson's features, was trialed on Sinemet 25-100 mg 1 tablet 3 times daily, no benefit whatsoever.   At last visit, was sent for PT, reportedly was not contacted.  Continues with sensation of lower extremity weakness, trouble with balance.  Has had 2 falls, tripped over her cane trying to get to the bathroom.  Feels a sensation of a board underneath her feet, worse at night.  Has kidney cancer, has annual follow-up, has been stable.  Having a colonoscopy, having diarrhea.  No neck pain per se.  Does complain of weakness to both hands, having more trouble drying her hair, may drop things, is not a new problem, but has been getting worse.  Recently saw PCP, labs were normal.  She drove herself here.  Presents today for evaluation unaccompanied.  HISTORY 08/16/2020 SS: [name] is a 79 year old female with history of lower extremity weakness for several years.    MRI of lumbar spine in 2018 did not show any definitive etiology, EMG at that time did not show evidence of a myopathic disorder.   She has had some mild Parkinson's features on exam, she was trialed on Sinemet up to 25-100 mg 1 tablet 3 times daily-had benefit whatsoever  EMG/NCV was repeated in June 2021, no neuropathy was seen, cannot exclude the possibility of a small fiber neuropathy, EMG of the right leg was unremarkable.  Continues with chronic sensation of proximal lower extremity weakness, trouble standing, has to use her  arms to push or pull herself, when getting in and out of the car, has to pick her legs up.  No falls, using cane.  Feels sensation of cardboard to bottom of feet, taking Tylenol.  Previously tried physical therapy without benefit.  Has known right kidney cancer, is monitoring, is slow growing.  The sensation of muscle weakness has been present since 2018. B & B are stable.  Intermittent low back pain, radiating down the right leg. Does home exercises to strengthen her legs. Here today for follow-up unaccompanied, her choice, her husband was in the lobby.   REVIEW OF SYSTEMS: Out of a complete 14 system review of symptoms, the patient complains only of the following symptoms, and all other reviewed systems are negative.  Weakness  ALLERGIES: Allergies  Allergen Reactions  . Bactrim Other (See Comments)    Unsure childhood reaction.  . Erythromycin Hives    Broke out in hives  . Nitrofurantoin Monohyd Macro Rash  . Vicodin [Hydrocodone-Acetaminophen] Rash    HOME MEDICATIONS: Outpatient Medications Prior to Visit  Medication Sig Dispense Refill  . Cholecalciferol (VITAMIN D3 PO) Take by mouth.    Marland Kitchen lisinopril (ZESTRIL) 10 MG tablet Take 1 tablet by mouth once daily 90 tablet 0  . lovastatin (MEVACOR) 20 MG tablet at bedtime.   6  . Multiple Vitamin (MULTIVITAMIN WITH MINERALS) TABS tablet Take 1 tablet by mouth daily.    . NON FORMULARY CBD Gummies    . SYNTHROID 88 MCG tablet 88 mcg  daily.      No facility-administered medications prior to visit.    PAST MEDICAL HISTORY: Past Medical History:  Diagnosis Date  . Cancer [facility])    kidney, seeing dr in [place]  . Grave's disease   . Reflux     PAST SURGICAL HISTORY: Past Surgical History:  Procedure Laterality Date  . ABDOMINAL HYSTERECTOMY    . SALIVARY GLAND SURGERY    . TRIGGER FINGER RELEASE  2018  . TRIGGER FINGER RELEASE  2018    FAMILY HISTORY: Family History  Problem Relation Age of Onset  . Cancer - Lung  Father   . Mental illness Maternal Grandmother     SOCIAL HISTORY: Social History   Socioeconomic History  . Marital status: Married    Spouse name: Lyndle Herrlich  . Number of children: 2  . Years of education: Dental assitant  . Highest education level: Not on file  Occupational History  . Not on file  Tobacco Use  . Smoking status: Former Smoker    Packs/day: 0.25    Years: 20.00    Pack years: 5.00    Types: Cigarettes    Quit date: 05/01/2004    Years since quitting: 16.5  . Smokeless tobacco: Never Used  Vaping Use  . Vaping Use: Never used  Substance and Sexual Activity  . Alcohol use: No  . Drug use: No  . Sexual activity: Not on file  Other Topics Concern  . Not on file  Social History Narrative   Lives with husband, Lyndle Herrlich   Caffeine use: Drinks coffee daily   Right handed   Social Determinants of Health   Financial Resource Strain:   . Difficulty of Paying Living Expenses: Not on file  Food Insecurity:   . Worried About Charity fundraiser in the Last Year: Not on file  . Ran Out of Food in the Last Year: Not on file  Transportation Needs:   . Lack of Transportation (Medical): Not on file  . Lack of Transportation (Non-Medical): Not on file  Physical Activity:   . Days of Exercise per Week: Not on file  . Minutes of Exercise per Session: Not on file  Stress:   . Feeling of Stress : Not on file  Social Connections:   . Frequency of Communication with Friends and Family: Not on file  . Frequency of Social Gatherings with Friends and Family: Not on file  . Attends Religious Services: Not on file  . Active Member of Clubs or Organizations: Not on file  . Attends Archivist Meetings: Not on file  . Marital Status: Not on file  Intimate Partner Violence:   . Fear of Current or Ex-Partner: Not on file  . Emotionally Abused: Not on file  . Physically Abused: Not on file  . Sexually Abused: Not on file   PHYSICAL EXAM  Vitals:   11/05/20 1027  BP:  128/68  Pulse: 70  Weight: 157 lb 12.8 oz (71.6 kg)  Height: 5\' 2"  (1.575 m)   Body mass index is 28.86 kg/m.  Generalized: Well developed, in no acute distress   Neurological examination  Mentation: Alert oriented to time, place, history taking. Follows all commands speech and language fluent, voice slightly hoarse, slow Cranial nerve II-XII: Pupils were equal round reactive to light. Extraocular movements were full, visual field were full on confrontational test. Facial sensation and strength were normal. Head turning and shoulder shrug  were normal and symmetric. Motor: Mild bilateral hip flexion  weakness bilaterally, no tremor, moderate grip strength bilaterally Sensory: Sensory testing is intact to soft touch on all 4 extremities. No evidence of extinction is noted.  Coordination: Cerebellar testing reveals good finger-nose-finger and heel-to-shin bilaterally.  Gait and station: Is slightly wide-based, but steady, can walk independently, good pace, good turns, normal arm swing Reflexes: Deep tendon reflexes are symmetric and normal bilaterally.   DIAGNOSTIC DATA (LABS, IMAGING, TESTING) - I reviewed patient records, labs, notes, testing and imaging myself where available.  Lab Results  Component Value Date   WBC 11.8 (H) 11/17/2007   HGB 11.0 DELTA CHECK NOTED (L) 11/17/2007   HCT 31.7 (L) 11/17/2007   MCV 96.5 11/17/2007   PLT 296 11/17/2007      Component Value Date/Time   NA 142 08/25/2019 0849   K 4.1 08/25/2019 0849   CL 104 08/25/2019 0849   CO2 24 08/25/2019 0849   GLUCOSE 116 (H) 08/25/2019 0849   BUN 14 08/25/2019 0849   CREATININE 0.77 08/25/2019 0849   CALCIUM 9.5 08/25/2019 0849   PROT 6.6 11/20/2017 1046   ALBUMIN 4.3 11/20/2017 1046   AST 29 11/20/2017 1046   ALT 21 11/20/2017 1046   ALKPHOS 73 11/20/2017 1046   BILITOT 0.3 11/20/2017 1046   GFRNONAA 76 08/25/2019 0849   GFRAA 87 08/25/2019 0849   No results found for: CHOL, HDL, LDLCALC, LDLDIRECT,  TRIG, CHOLHDL No results found for: HGBA1C Lab Results  Component Value Date   VITAMINB12 846 11/20/2017   No results found for: TSH  ASSESSMENT AND PLAN 79 y.o. year old female  has a past medical history of Cancer ([place]), Grave's disease, and Reflux. here with:  1.  Gait disturbance 2.  Bilateral lower extremity weakness  -History of chronic bilateral lower extremity weakness since 2018  -No benefit with empiric Sinemet trial 25-100 mg 1 tablet 3 times daily  -Nerve conduction evaluation in June 2021 was unremarkable, no evidence of neuropathy, but cannot exclude the possibility of a small fiber neuropathy  -CK, CK-MB were normal in May 2021  -MRI of lumbar spine in August 2021, showed no etiology for symptoms of leg weakness, no surgical issues  -Due to continued symptoms, will send for MRI of cervical spine (leg weakness, gait and balance instability, weakness bilateral hands, dropping things)  -Refer again for PT (gait and balance, leg strengthening exercises), call me if she doesn't hear anything  -Keep follow-up appointment with Dr. Jannifer Franklin in March  I spent 30 minutes of face-to-face and non-face-to-face time with patient.  This included previsit chart review, lab review, study review, order entry, electronic health record documentation, patient education.  Butler Denmark, AGNP-C, DNP 11/05/2020, 10:56 AM [facility] [address], [place] [place], [place] [address] [phone]

## 2020-11-05 NOTE — Telephone Encounter (Signed)
[employer] medicare order sent to [facility]. They will obtain the auth and reach out to the patient to schedule.  °

## 2020-11-05 NOTE — Telephone Encounter (Signed)
Pt called wanting to inform provider that her PCP has already set her up with a PT appt. Please advise.

## 2020-11-05 NOTE — Telephone Encounter (Signed)
Okay, just verify PT is in the works for her. My plan to send to PT for gait and balance training, leg strengthening. You can cancel if she is already set up for PT.

## 2020-11-05 NOTE — Telephone Encounter (Signed)
Noted  

## 2020-11-07 DIAGNOSIS — R269 Unspecified abnormalities of gait and mobility: Secondary | ICD-10-CM | POA: Diagnosis not present

## 2020-11-08 NOTE — Telephone Encounter (Signed)
[employer] medicare did not approve the MRI Cervical spine.  "based on medicare guidelines imaging pain without and with neurological features.Marland Kitchen we cannot approve this request. Your records show that you may have a problem with your neck. The request  cannot be approved because. There is no record that you had contact (in person, by phone, by mail, or by messaging) with your doctor after a recent (within 3 months) six week trial of treatment that failed to improve your symptoms. Supported treatments include but not limited to medications for swelling or pain, physical therapy, and/or oral or injected steroids. "  There is in an option to do a peer to peer. The phone number is [phone] option 4. The case number is [id number]. The deadline is 11/14/20.

## 2020-11-11 ENCOUNTER — Other Ambulatory Visit: Payer: Medicare HMO

## 2020-11-12 NOTE — Telephone Encounter (Signed)
MRI cervical spine approved peer to peer # [id number], good for 180 calendar days.

## 2020-11-13 NOTE — Telephone Encounter (Signed)
Noted, thank you!! Patient is scheduled at GI for 11/24/20

## 2020-11-19 ENCOUNTER — Other Ambulatory Visit: Payer: Self-pay | Admitting: Gastroenterology

## 2020-11-19 DIAGNOSIS — R269 Unspecified abnormalities of gait and mobility: Secondary | ICD-10-CM | POA: Diagnosis not present

## 2020-11-19 DIAGNOSIS — Z1211 Encounter for screening for malignant neoplasm of colon: Secondary | ICD-10-CM

## 2020-11-19 DIAGNOSIS — Z85528 Personal history of other malignant neoplasm of kidney: Secondary | ICD-10-CM | POA: Diagnosis not present

## 2020-11-20 DIAGNOSIS — R269 Unspecified abnormalities of gait and mobility: Secondary | ICD-10-CM | POA: Diagnosis not present

## 2020-11-22 DIAGNOSIS — R269 Unspecified abnormalities of gait and mobility: Secondary | ICD-10-CM | POA: Diagnosis not present

## 2020-11-24 ENCOUNTER — Ambulatory Visit
Admission: RE | Admit: 2020-11-24 | Discharge: 2020-11-24 | Disposition: A | Discharge status: Home / Self Care | Payer: Medicare HMO | Source: Ambulatory Visit | Attending: Neurology | Admitting: Neurology

## 2020-11-24 DIAGNOSIS — R269 Unspecified abnormalities of gait and mobility: Secondary | ICD-10-CM | POA: Diagnosis not present

## 2020-11-24 DIAGNOSIS — M6281 Muscle weakness (generalized): Secondary | ICD-10-CM

## 2020-11-28 ENCOUNTER — Telehealth: Payer: Self-pay

## 2020-11-28 NOTE — Telephone Encounter (Signed)
-----   Message from Suzzanne Cloud, NP sent at 11/26/2020  1:46 PM EST ----- MRI of cervical spine shows no significant abnormalities to explain leg weakness.  Recommend she continue physical therapy, focusing on leg strengthening exercises.  IMPRESSION: Abnormal MRI scan cervical spine without contrast showing prominent disc degenerative changes from C4-C7 with central disc herniation at C4-5 without significant compression.  There is moderate left and mild right-sided foraminal narrowing at C5-6 but without significant compression

## 2020-11-28 NOTE — Telephone Encounter (Signed)
Attempted to call pt, LVM for normal results per DPR. Ask pt to call back for questions or concerns.  

## 2020-12-06 ENCOUNTER — Other Ambulatory Visit: Payer: Self-pay

## 2020-12-06 ENCOUNTER — Ambulatory Visit (INDEPENDENT_AMBULATORY_CARE_PROVIDER_SITE_OTHER): Payer: Medicare HMO | Admitting: Physician Assistant

## 2020-12-06 ENCOUNTER — Encounter: Payer: Self-pay | Admitting: Physician Assistant

## 2020-12-06 VITALS — BP 130/68 | HR 72 | Temp 98.1°F | Ht 61.5 in | Wt 161.0 lb

## 2020-12-06 DIAGNOSIS — R2681 Unsteadiness on feet: Secondary | ICD-10-CM | POA: Diagnosis not present

## 2020-12-06 DIAGNOSIS — R296 Repeated falls: Secondary | ICD-10-CM

## 2020-12-06 DIAGNOSIS — E785 Hyperlipidemia, unspecified: Secondary | ICD-10-CM

## 2020-12-06 DIAGNOSIS — E039 Hypothyroidism, unspecified: Secondary | ICD-10-CM

## 2020-12-06 DIAGNOSIS — I10 Essential (primary) hypertension: Secondary | ICD-10-CM | POA: Diagnosis not present

## 2020-12-06 DIAGNOSIS — R269 Unspecified abnormalities of gait and mobility: Secondary | ICD-10-CM | POA: Diagnosis not present

## 2020-12-06 NOTE — Progress Notes (Signed)
Patient presents to clinic today to establish care.  Patient with history of renal cancer, followed by nephrology/urology in [place], [place].  Notes they are currently monitoring her tumor.  Records have been requested.  Patient also notes a history of Graves' disease, currently on Synthroid 88 mcg daily.  Patient also with history of hypertension hyperlipidemia for which she takes lisinopril 10 mg daily and lovastatin 20 mg daily as directed. Patient denies chest pain, palpitations, lightheadedness, dizziness, vision changes or frequent headaches.  Patient does note ongoing issue with gait disturbance and recurring falls.  Has previously been evaluated by [facility] neurology including negative brain MRI.  States she feels that she does not have a therapeutic relationship with her provider there and feels that they are not helping her.  Has done physical therapy with only slight improvement in gait instability.  Would like a second opinion to make sure nothing else is needed/warranted.  Health Maintenance:  Colonoscopy -- records requested. Mammogram -- records requested Bone Density -- records requested.   Past Medical History:  Diagnosis Date  . Allergy   . Arthritis   . Cancer [facility])    kidney, seeing dr in [place]  . Grave's disease   . Reflux     Past Surgical History:  Procedure Laterality Date  . ABDOMINAL HYSTERECTOMY    . SALIVARY GLAND SURGERY    . TRIGGER FINGER RELEASE  2018  . TRIGGER FINGER RELEASE  2018    Current Outpatient Medications on File Prior to Visit  Medication Sig Dispense Refill  . Cholecalciferol (VITAMIN D3 PO) Take by mouth.    Marland Kitchen lisinopril (ZESTRIL) 10 MG tablet Take 1 tablet by mouth once daily 90 tablet 0  . lovastatin (MEVACOR) 20 MG tablet at bedtime.   6  . Multiple Vitamin (MULTIVITAMIN WITH MINERALS) TABS tablet Take 1 tablet by mouth daily.    Marland Kitchen SYNTHROID 88 MCG tablet 88 mcg daily.      No current facility-administered  medications on file prior to visit.    Allergies  Allergen Reactions  . Bactrim Other (See Comments)    Unsure childhood reaction.  . Erythromycin Hives    Broke out in hives  . Nitrofurantoin Monohyd Macro Rash  . Vicodin [Hydrocodone-Acetaminophen] Rash    Family History  Problem Relation Age of Onset  . Kidney disease Mother   . Cancer - Lung Father   . Mental illness Maternal Grandmother     Social History   Socioeconomic History  . Marital status: Married    Spouse name: Lyndle Herrlich  . Number of children: 2  . Years of education: Dental assitant  . Highest education level: Not on file  Occupational History  . Not on file  Tobacco Use  . Smoking status: Former Smoker    Packs/day: 0.25    Years: 20.00    Pack years: 5.00    Types: Cigarettes    Quit date: 05/01/2004    Years since quitting: 16.6  . Smokeless tobacco: Never Used  Vaping Use  . Vaping Use: Never used  Substance and Sexual Activity  . Alcohol use: Never  . Drug use: Never  . Sexual activity: Not Currently    Birth control/protection: Surgical  Other Topics Concern  . Not on file  Social History Narrative   Lives with husband, Lyndle Herrlich   Caffeine use: Drinks coffee daily   Right handed   Social Determinants of Health   Financial Resource Strain: Not on file  Food Insecurity:  Not on file  Transportation Needs: Not on file  Physical Activity: Not on file  Stress: Not on file  Social Connections: Not on file  Intimate Partner Violence: Not on file   ROS Pertinent ROS are listed in the HPI.  BP 130/68 (BP Location: Left Arm, Patient Position: Sitting, Cuff Size: Normal)   Pulse 72   Temp 98.1 F (36.7 C) (Oral)   Ht 5' 1.5" (1.562 m)   Wt 161 lb (73 kg)   SpO2 99%   BMI 29.93 kg/m   Physical Exam Vitals reviewed.  Constitutional:      Appearance: Normal appearance.  HENT:     Head: Normocephalic and atraumatic.  Eyes:     Conjunctiva/sclera: Conjunctivae normal.     Pupils: Pupils  are equal, round, and reactive to light.  Cardiovascular:     Rate and Rhythm: Normal rate and regular rhythm.     Pulses: Normal pulses.     Heart sounds: Normal heart sounds.  Pulmonary:     Effort: Pulmonary effort is normal.     Breath sounds: Normal breath sounds.  Musculoskeletal:     Cervical back: Neck supple.  Neurological:     General: No focal deficit present.     Mental Status: She is alert and oriented to person, place, and time.     Cranial Nerves: No cranial nerve deficit.     Motor: Weakness (Proximal weakness of bilateral lower extremities noted (4/5).  Distal strength 5 out of 5.) present.  Psychiatric:        Mood and Affect: Mood normal.      Assessment/Plan: 1. Recurrent falls 2. Gait instability Uses a cane to ambulate but still having issues with instability.  Physical therapy has been slightly beneficial but still feeling quite weak and having "close calls" with recurring falls.  Would like second opinion.  Referral to new neurologist place for assessment.  Discussed use of walker for stability in the meantime.  She will give some thought to this. - Ambulatory referral to Neurology  3. Essential hypertension BP stable.  Asymptomatic.  Continue current medication regimen.  4. Hyperlipidemia, unspecified hyperlipidemia type Continue statin as directed.  Records requested to review recent lab results.  5. Acquired hypothyroidism Continue Synthroid.  Will obtain records to review recent lab results.  Discussed we will likely check TSH every 6 months unless has been stable for several years at which point we will switch to yearly assessment.   Leeanne Rio, PA-C

## 2020-12-06 NOTE — Patient Instructions (Signed)
Please continue chronic medications.  I am setting you up with a new Neurologist for evaluation. Continue physical therapy. Follow-up with your eye doctor and podiatrist.  I am working on getting records from your other providers so we can review and make sure everything is up-to-date.  It was very nice meeting you today. Welcome to [facility]!  Marland Kitchen

## 2020-12-10 ENCOUNTER — Other Ambulatory Visit: Payer: Self-pay | Admitting: Physician Assistant

## 2020-12-10 ENCOUNTER — Telehealth: Payer: Self-pay | Admitting: Physician Assistant

## 2020-12-10 DIAGNOSIS — R269 Unspecified abnormalities of gait and mobility: Secondary | ICD-10-CM | POA: Diagnosis not present

## 2020-12-10 NOTE — Telephone Encounter (Signed)
LOV 12/16 to establish care. Historical provider was filling patient's synthroid. No TSH level on file. Please advise.

## 2020-12-10 NOTE — Telephone Encounter (Signed)
Pt called in asking for a refill on the Synthroid to be sent to the [facility] on [address].    Please advise

## 2020-12-11 ENCOUNTER — Other Ambulatory Visit: Payer: Self-pay

## 2020-12-11 ENCOUNTER — Telehealth: Payer: Self-pay | Admitting: Physician Assistant

## 2020-12-11 DIAGNOSIS — E039 Hypothyroidism, unspecified: Secondary | ICD-10-CM

## 2020-12-11 MED ORDER — SYNTHROID 88 MCG PO TABS: 88.0000 ug | ORAL_TABLET | Freq: Every day | ORAL | 0 refills | Status: DC

## 2020-12-11 NOTE — Telephone Encounter (Signed)
Ok to refill with quantity for 90-days. Would schedule her for TSH level in within next 4 weeks just so we can make sure this continues to be stable.

## 2020-12-11 NOTE — Telephone Encounter (Signed)
Medication sent to patient's preferred pharmacy. Called patient to inform of this. Patient informed me that her previous PCP filled her synthroid. Patient states she will cancel the prescription that our office sent in. Patient also states that she is unable to take the generic synthroid. Patient scheduled for lab appointment for 4 weeks to check TSH levels.

## 2020-12-11 NOTE — Telephone Encounter (Signed)
Referral that we placed on 12/06/20 was denied "Pt is est with [facility] and would need to follow up there they just saw patient and he has a return visit with sch" That is the denial note for referral. Please advise.

## 2020-12-11 NOTE — Telephone Encounter (Signed)
Pt would like a second option to a different  neurology  She would like to go to [facility]. She states that she is having lots of trouble out of there legs and has fallen a few times.   Please advise

## 2020-12-12 ENCOUNTER — Other Ambulatory Visit: Payer: Self-pay

## 2020-12-12 DIAGNOSIS — R296 Repeated falls: Secondary | ICD-10-CM

## 2020-12-12 DIAGNOSIS — R413 Other amnesia: Secondary | ICD-10-CM

## 2020-12-12 DIAGNOSIS — R2681 Unsteadiness on feet: Secondary | ICD-10-CM

## 2020-12-12 NOTE — Telephone Encounter (Signed)
Called patient and informed her of the status of the referral. Patient voiced understanding and states she does not want to go back to [facility].

## 2020-12-12 NOTE — Telephone Encounter (Signed)
Referral placed.

## 2020-12-12 NOTE — Telephone Encounter (Signed)
I would let patient know that the referral was denied and their reasoning. I can try to set her up with another group like [facility] if she would like.

## 2020-12-12 NOTE — Telephone Encounter (Signed)
Ok to place new referral to neuro -- [facility]

## 2020-12-17 ENCOUNTER — Inpatient Hospital Stay: Admission: RE | Admit: 2020-12-17 | Payer: Medicare HMO | Source: Ambulatory Visit

## 2020-12-24 ENCOUNTER — Other Ambulatory Visit: Payer: Self-pay | Admitting: Cardiology

## 2020-12-24 DIAGNOSIS — I10 Essential (primary) hypertension: Secondary | ICD-10-CM

## 2020-12-28 ENCOUNTER — Other Ambulatory Visit: Payer: Self-pay | Admitting: Emergency Medicine

## 2020-12-28 DIAGNOSIS — E785 Hyperlipidemia, unspecified: Secondary | ICD-10-CM

## 2020-12-28 MED ORDER — LOVASTATIN 20 MG PO TABS: 20.0000 mg | ORAL_TABLET | Freq: Every day | ORAL | 1 refills | Status: DC

## 2021-01-01 DIAGNOSIS — I739 Peripheral vascular disease, unspecified: Secondary | ICD-10-CM | POA: Diagnosis not present

## 2021-01-01 DIAGNOSIS — E785 Hyperlipidemia, unspecified: Secondary | ICD-10-CM | POA: Diagnosis not present

## 2021-01-01 DIAGNOSIS — I2510 Atherosclerotic heart disease of native coronary artery without angina pectoris: Secondary | ICD-10-CM | POA: Diagnosis not present

## 2021-01-01 DIAGNOSIS — G629 Polyneuropathy, unspecified: Secondary | ICD-10-CM | POA: Diagnosis not present

## 2021-01-01 DIAGNOSIS — I700 Atherosclerosis of aorta: Secondary | ICD-10-CM | POA: Diagnosis not present

## 2021-01-01 DIAGNOSIS — M1990 Unspecified osteoarthritis, unspecified site: Secondary | ICD-10-CM | POA: Diagnosis not present

## 2021-01-01 DIAGNOSIS — Z008 Encounter for other general examination: Secondary | ICD-10-CM | POA: Diagnosis not present

## 2021-01-01 DIAGNOSIS — E039 Hypothyroidism, unspecified: Secondary | ICD-10-CM | POA: Diagnosis not present

## 2021-01-01 DIAGNOSIS — I10 Essential (primary) hypertension: Secondary | ICD-10-CM | POA: Diagnosis not present

## 2021-01-01 DIAGNOSIS — G8929 Other chronic pain: Secondary | ICD-10-CM | POA: Diagnosis not present

## 2021-01-01 DIAGNOSIS — E663 Overweight: Secondary | ICD-10-CM | POA: Diagnosis not present

## 2021-01-02 ENCOUNTER — Encounter: Payer: Self-pay | Admitting: Physician Assistant

## 2021-01-02 ENCOUNTER — Other Ambulatory Visit: Payer: Self-pay | Admitting: Physician Assistant

## 2021-01-04 ENCOUNTER — Other Ambulatory Visit (INDEPENDENT_AMBULATORY_CARE_PROVIDER_SITE_OTHER): Payer: Self-pay

## 2021-01-04 DIAGNOSIS — E039 Hypothyroidism, unspecified: Secondary | ICD-10-CM

## 2021-01-04 LAB — TSH: TSH: 0.15 u[IU]/mL — ABNORMAL LOW (ref 0.35–4.50)

## 2021-01-08 ENCOUNTER — Other Ambulatory Visit (INDEPENDENT_AMBULATORY_CARE_PROVIDER_SITE_OTHER): Payer: Medicare HMO

## 2021-01-08 ENCOUNTER — Other Ambulatory Visit: Payer: Self-pay

## 2021-01-08 ENCOUNTER — Other Ambulatory Visit: Payer: Medicare HMO

## 2021-01-08 DIAGNOSIS — R7989 Other specified abnormal findings of blood chemistry: Secondary | ICD-10-CM

## 2021-01-08 LAB — T3, FREE: T3, Free: 2.8 pg/mL (ref 2.3–4.2)

## 2021-01-09 ENCOUNTER — Telehealth: Payer: Self-pay | Admitting: Physician Assistant

## 2021-01-09 LAB — T4, FREE: Free T4: 1.05 ng/dL (ref 0.60–1.60)

## 2021-01-09 NOTE — Telephone Encounter (Signed)
Advised patient of lab results, will keep thyroid medication at current dose and recheck in 3 months

## 2021-01-09 NOTE — Telephone Encounter (Signed)
Patient would like to know if her medication needs to be changed based on her last labs.

## 2021-01-09 NOTE — Telephone Encounter (Signed)
See result notes. No change to medication dose.

## 2021-01-10 DIAGNOSIS — R2681 Unsteadiness on feet: Secondary | ICD-10-CM | POA: Diagnosis not present

## 2021-01-10 DIAGNOSIS — R200 Anesthesia of skin: Secondary | ICD-10-CM | POA: Diagnosis not present

## 2021-01-10 DIAGNOSIS — R413 Other amnesia: Secondary | ICD-10-CM | POA: Diagnosis not present

## 2021-01-10 DIAGNOSIS — R296 Repeated falls: Secondary | ICD-10-CM | POA: Diagnosis not present

## 2021-01-14 DIAGNOSIS — R269 Unspecified abnormalities of gait and mobility: Secondary | ICD-10-CM | POA: Diagnosis not present

## 2021-01-17 ENCOUNTER — Other Ambulatory Visit: Payer: Medicare HMO

## 2021-01-17 DIAGNOSIS — R269 Unspecified abnormalities of gait and mobility: Secondary | ICD-10-CM | POA: Diagnosis not present

## 2021-01-20 DIAGNOSIS — R2681 Unsteadiness on feet: Secondary | ICD-10-CM | POA: Diagnosis not present

## 2021-01-20 DIAGNOSIS — R296 Repeated falls: Secondary | ICD-10-CM | POA: Diagnosis not present

## 2021-01-20 DIAGNOSIS — I6782 Cerebral ischemia: Secondary | ICD-10-CM | POA: Diagnosis not present

## 2021-01-20 DIAGNOSIS — R413 Other amnesia: Secondary | ICD-10-CM | POA: Diagnosis not present

## 2021-01-20 DIAGNOSIS — G319 Degenerative disease of nervous system, unspecified: Secondary | ICD-10-CM | POA: Diagnosis not present

## 2021-01-21 DIAGNOSIS — R269 Unspecified abnormalities of gait and mobility: Secondary | ICD-10-CM | POA: Diagnosis not present

## 2021-01-23 DIAGNOSIS — R269 Unspecified abnormalities of gait and mobility: Secondary | ICD-10-CM | POA: Diagnosis not present

## 2021-01-28 DIAGNOSIS — R269 Unspecified abnormalities of gait and mobility: Secondary | ICD-10-CM | POA: Diagnosis not present

## 2021-01-29 ENCOUNTER — Ambulatory Visit: Payer: Medicare HMO | Admitting: Physician Assistant

## 2021-01-31 DIAGNOSIS — R269 Unspecified abnormalities of gait and mobility: Secondary | ICD-10-CM | POA: Diagnosis not present

## 2021-02-04 DIAGNOSIS — R269 Unspecified abnormalities of gait and mobility: Secondary | ICD-10-CM | POA: Diagnosis not present

## 2021-02-07 DIAGNOSIS — R269 Unspecified abnormalities of gait and mobility: Secondary | ICD-10-CM | POA: Diagnosis not present

## 2021-02-07 DIAGNOSIS — M2060 Acquired deformities of toe(s), unspecified, unspecified foot: Secondary | ICD-10-CM | POA: Diagnosis not present

## 2021-02-07 DIAGNOSIS — R413 Other amnesia: Secondary | ICD-10-CM | POA: Diagnosis not present

## 2021-02-07 DIAGNOSIS — M6281 Muscle weakness (generalized): Secondary | ICD-10-CM | POA: Diagnosis not present

## 2021-02-14 DIAGNOSIS — R269 Unspecified abnormalities of gait and mobility: Secondary | ICD-10-CM | POA: Diagnosis not present

## 2021-02-18 DIAGNOSIS — R269 Unspecified abnormalities of gait and mobility: Secondary | ICD-10-CM | POA: Diagnosis not present

## 2021-02-19 ENCOUNTER — Encounter: Payer: Self-pay | Admitting: Physician Assistant

## 2021-02-19 ENCOUNTER — Ambulatory Visit (INDEPENDENT_AMBULATORY_CARE_PROVIDER_SITE_OTHER): Payer: Medicare HMO | Admitting: Physician Assistant

## 2021-02-19 ENCOUNTER — Other Ambulatory Visit: Payer: Self-pay

## 2021-02-19 VITALS — BP 110/64 | HR 68 | Temp 97.9°F | Ht 61.5 in | Wt 155.2 lb

## 2021-02-19 DIAGNOSIS — R296 Repeated falls: Secondary | ICD-10-CM | POA: Diagnosis not present

## 2021-02-19 DIAGNOSIS — E785 Hyperlipidemia, unspecified: Secondary | ICD-10-CM | POA: Diagnosis not present

## 2021-02-19 DIAGNOSIS — I10 Essential (primary) hypertension: Secondary | ICD-10-CM

## 2021-02-19 DIAGNOSIS — E039 Hypothyroidism, unspecified: Secondary | ICD-10-CM | POA: Diagnosis not present

## 2021-02-19 DIAGNOSIS — R413 Other amnesia: Secondary | ICD-10-CM | POA: Diagnosis not present

## 2021-02-19 DIAGNOSIS — R634 Abnormal weight loss: Secondary | ICD-10-CM | POA: Diagnosis not present

## 2021-02-19 DIAGNOSIS — R2681 Unsteadiness on feet: Secondary | ICD-10-CM | POA: Diagnosis not present

## 2021-02-19 NOTE — Patient Instructions (Addendum)
Good to see you today! Please keep regular follow up with your specialists. I will see you back in early April to go over labs with you - if you can get these done a few days prior to the appointment that will be helpful.   Call sooner if you have any concerns!   Preventing Falls and Fractures  Falls can be very serious, especially for older adults or people with osteoporosis  Falls can be caused by:  Tripping or slipping  Slow reflexes  Balance problems  Reduced muscle strength  Poor vision or a recent change in prescription  Illness and some medications (especially blood pressure pills, diuretics, heart medicines, muscle relaxants and sleep medications)  Drinking alcohol  To prevent falls outdoors:  Use a can or walker if needed  Wear rubber-soled shoes so you don't slip  DO NOT buy "shape up" shoes with rocker bottom soles if you have balance problems.  The thick soles and shape make it more difficult to keep your balance.  Put kitty litter or salt on icy sidewalks  Walk on the grass if the sidewalks are slick  Avoid walking on uneven ground whenever possible  T prevent falls indoors:  Keep rooms clutter-free, especially hallways, stairs and paths to light switches  Remove throw rugs  Install night lights, especially to and in the bathroom  Turn on lights before going downstairs  Keep a flashlight next to your bed  Buy a cordless phone to keep with you instead of jumping up to answer the phone  Install grab bars in the bathroom near the shower and toilet  Install rails on both sides of the stairs.  Make sure the stairs are well lit  Wear slippers with non-skid soles.  Do not walk around in stockings or socks  Balance problems and dizziness are not a normal part of growing older.  If you begin having balance problems or dizziness see your doctor.  Physical Therapy can help you with many balance problems, strengthening hip and leg muscles and with gait  training.  To keep your bones healthy make sure you are getting enough calcium and Vitamin D each day.  Ask your doctor or pharmacist about supplements.  Regular weight-bearing exercise like walking, lifting weights or dancing can help strengthen bones and prevent osteoporosis.

## 2021-02-19 NOTE — Progress Notes (Signed)
Established Patient Office Visit  Subjective:  Patient ID: [name], female    DOB: September 04, 1941  Age: 80 y.o. MRN: [id number]  CC:  Chief Complaint  Patient presents with  . Transitions Of Care    HPI [name] presents for transition of care from [facility], [place]. She is here with her husband, married 59 years. She has multiple medical conditions going on and sees the following specialists:  Specialists:  Neurology - Recent visit on 02/07/21 with Dr. Doy Hutching GYN - Dr. Nori Riis Urologist- Dr. [facility] - Dr. Paulla Dolly - Renal cancer, Stage I, just monitoring now, son also has this kind of cancer.  [facility] - Dr. Rosezena Sensor - Dr. Rolm Bookbinder Ophthalmologist - Dr. Marygrace Drought Cardiologist [facility] - Dr. Virgina Jock  Acute concerns:  Acquired hypothyroidism - Hx of thyroidectomy due to Grave's disease. Last TSH slightly low in 01/04/21. Advised to recheck in 3 months.   Unexpected weight loss of 20-25 lbs in the last year. Scheduled for colonoscopy next week.   Balance / weakness in legs issues: Uses a cane, sometimes a walker. Doing PT twice weekly.  Past Medical History:  Diagnosis Date  . Allergy   . Arthritis   . Cancer [facility])    kidney, seeing dr in [employer]  . Grave's disease   . Reflux     Past Surgical History:  Procedure Laterality Date  . ABDOMINAL HYSTERECTOMY    . SALIVARY GLAND SURGERY    . TRIGGER FINGER RELEASE  2018  . TRIGGER FINGER RELEASE  2018    Family History  Problem Relation Age of Onset  . Kidney disease Mother   . Cancer - Lung Father   . Mental illness Maternal Grandmother     Social History   Socioeconomic History  . Marital status: Married    Spouse name: Lyndle Herrlich  . Number of children: 2  . Years of education: Dental assitant  . Highest education level: Not on file  Occupational History  . Not on file  Tobacco Use  . Smoking status: Former Smoker    Packs/day: 0.25    Years:  20.00    Pack years: 5.00    Types: Cigarettes    Quit date: 05/01/2004    Years since quitting: 16.8  . Smokeless tobacco: Never Used  Vaping Use  . Vaping Use: Never used  Substance and Sexual Activity  . Alcohol use: Never  . Drug use: Never  . Sexual activity: Not Currently    Birth control/protection: Surgical  Other Topics Concern  . Not on file  Social History Narrative   Lives with husband, Vinny   Caffeine use: Drinks coffee daily   Right handed   Social Determinants of Health   Financial Resource Strain: Not on file  Food Insecurity: Not on file  Transportation Needs: Not on file  Physical Activity: Not on file  Stress: Not on file  Social Connections: Not on file  Intimate Partner Violence: Not on file    Outpatient Medications Prior to Visit  Medication Sig Dispense Refill  . Calcium 150 MG TABS 1 tab    . Cholecalciferol (VITAMIN D3 PO) Take by mouth.    Marland Kitchen lisinopril (ZESTRIL) 10 MG tablet Take 1 tablet by mouth once daily 90 tablet 0  . lovastatin (MEVACOR) 20 MG tablet Take 1 tablet (20 mg total) by mouth at bedtime. 90 tablet 1  . Magnesium 100 MG CAPS Take by mouth.    Marland Kitchen  Multiple Vitamin (MULTIVITAMIN WITH MINERALS) TABS tablet Take 1 tablet by mouth daily.    . pantoprazole (PROTONIX) 40 MG tablet 1 tablet    . Pediatric Multivitamins-Fl (MULTIVITAMINS/FL PO) 1 tab    . SYNTHROID 88 MCG tablet Take 1 tablet (88 mcg total) by mouth daily before breakfast. 90 tablet 0   No facility-administered medications prior to visit.    Allergies  Allergen Reactions  . Bactrim Other (See Comments)    Unsure childhood reaction.  . Erythromycin Hives    Broke out in hives  . Sulfa Antibiotics Other (See Comments)  . Nitrofurantoin Monohyd Macro Rash  . Other Rash  . Vicodin [Hydrocodone-Acetaminophen] Rash    ROS Review of Systems  Constitutional: Positive for fatigue and unexpected weight change. Negative for activity change, appetite change and fever.   HENT: Negative for congestion.   Eyes: Negative for visual disturbance.  Respiratory: Negative for apnea, cough and shortness of breath.   Cardiovascular: Negative for chest pain, palpitations and leg swelling.  Gastrointestinal: Negative for abdominal pain, blood in stool, constipation and diarrhea.  Endocrine: Negative for polydipsia, polyphagia and polyuria.  Musculoskeletal: Positive for arthralgias and gait problem.  Skin: Negative for rash.  Neurological: Positive for weakness. Negative for dizziness and headaches.  Hematological: Negative for adenopathy. Does not bruise/bleed easily.  Psychiatric/Behavioral: Positive for confusion. Negative for sleep disturbance and suicidal ideas. The patient is not nervous/anxious.       Objective:    Physical Exam Constitutional:      Appearance: Normal appearance.  HENT:     Head: Normocephalic and atraumatic.     Right Ear: External ear normal.     Left Ear: External ear normal.     Nose: Nose normal.     Mouth/Throat:     Mouth: Mucous membranes are moist.  Eyes:     Extraocular Movements: Extraocular movements intact.     Pupils: Pupils are equal, round, and reactive to light.  Cardiovascular:     Rate and Rhythm: Normal rate and regular rhythm.     Pulses: Normal pulses.     Heart sounds: Normal heart sounds.  Pulmonary:     Effort: Pulmonary effort is normal.     Breath sounds: Normal breath sounds.  Musculoskeletal:     Cervical back: Normal range of motion.  Skin:    General: Skin is warm and dry.  Neurological:     Mental Status: She is alert. Mental status is at baseline.     Motor: Weakness present.     Comments: Unsteady   Psychiatric:        Mood and Affect: Mood normal.        Behavior: Behavior normal.     BP 110/64   Pulse 68   Temp 97.9 F (36.6 C)   Ht 5' 1.5" (1.562 m)   Wt 155 lb 4 oz (70.4 kg)   SpO2 94%   BMI 28.86 kg/m  Wt Readings from Last 3 Encounters:  02/19/21 155 lb 4 oz (70.4 kg)   12/06/20 161 lb (73 kg)  11/05/20 157 lb 12.8 oz (71.6 kg)    Health Maintenance Due  Topic Date Due  . Hepatitis C Screening  Never done  . DEXA SCAN  Never done    There are no preventive care reminders to display for this patient.  Lab Results  Component Value Date   TSH 0.15 (L) 01/04/2021   Lab Results  Component Value Date   WBC 11.8 (  H) 11/17/2007   HGB 11.0 DELTA CHECK NOTED (L) 11/17/2007   HCT 31.7 (L) 11/17/2007   MCV 96.5 11/17/2007   PLT 296 11/17/2007   Lab Results  Component Value Date   NA 142 08/25/2019   K 4.1 08/25/2019   CO2 24 08/25/2019   GLUCOSE 116 (H) 08/25/2019   BUN 14 08/25/2019   CREATININE 0.77 08/25/2019   BILITOT 0.3 11/20/2017   ALKPHOS 73 11/20/2017   AST 29 11/20/2017   ALT 21 11/20/2017   PROT 6.6 11/20/2017   ALBUMIN 4.3 11/20/2017   CALCIUM 9.5 08/25/2019     Assessment & Plan:   Problem List Items Addressed This Visit      Cardiovascular and Mediastinum   Essential hypertension   Relevant Orders   CBC with Differential/Platelet   Comprehensive metabolic panel    Other Visit Diagnoses    Recurrent falls    -  Primary   Relevant Orders   CBC with Differential/Platelet   Comprehensive metabolic panel   TSH   Gait instability       Memory changes       Acquired hypothyroidism       Relevant Orders   TSH   Hyperlipidemia, unspecified hyperlipidemia type       Relevant Orders   Lipid panel   Weight loss, unintentional       Relevant Orders   CBC with Differential/Platelet      No orders of the defined types were placed in this encounter.   Follow-up: Return in about 4 weeks (around 03/19/2021) for LABS AND THEN SCHEDULE APPT WITH Joakim Huesman A FEW DAYS AFTER LABS .   1. Recurrent falls 2. Gait instability 3. Memory changes Recent visit on 02-07-21 with her neurologist for these concerns.  She had an MRI done. Her MRI showed progression of hippocampal atrophy bilaterally but no acute changes.  She possibly has  Alzheimer's disease and Parkinson's disease.  Apparently she did not respond to Sinemet.  She also had a normal EMG/NCV for numbness in her feet and has now been scheduled with podiatry about this.  She will continue physical therapy twice a week.  Her and her husband are working on fall risk reduction measures.  She does use a cane or a walker.  4. Essential hypertension Her blood pressure is stable.  She is taking lisinopril 10 mg daily.  5. Acquired hypothyroidism She has a history of Graves' disease and then thyroidectomy per patient self-report.  She is taking Synthroid 88 mcg daily.  I will check this tsh value again at the end of the month and adjust dose accordingly if needed.  6. Hyperlipidemia, unspecified hyperlipidemia type She is taking lovastatin 20 mg daily.  Her cholesterol has not been checked in some time.  I will check this with next labs at the end of the month as well and then go over these with her and her husband.  7. Weight loss, unintentional Apparently she has lost 20 to 25 pounds in the last year per self-report from her and her husband.  They state that she has a colonoscopy scheduled on 02-27-21.  Her gynecologist Dr. Milta Deiters schedule this according to the patient.  I will need records on both of these.  Total time spent with patient and her husband obtaining history of her complicated medical history, reviewing medications, and prior notes from specialist plus discussing plan above was 63 minutes today.  This visit occurred during the SARS-CoV-2 public health emergency.  Safety  protocols were in place, including screening questions prior to the visit, additional usage of staff PPE, and extensive cleaning of exam room while observing appropriate contact time as indicated for disinfecting solutions.    Tanaja Ganger M Mychaela Lennartz, PA-C

## 2021-02-21 ENCOUNTER — Ambulatory Visit: Payer: Medicare HMO | Admitting: Neurology

## 2021-02-21 DIAGNOSIS — R269 Unspecified abnormalities of gait and mobility: Secondary | ICD-10-CM | POA: Diagnosis not present

## 2021-02-22 DIAGNOSIS — Z85528 Personal history of other malignant neoplasm of kidney: Secondary | ICD-10-CM | POA: Diagnosis not present

## 2021-02-25 DIAGNOSIS — R269 Unspecified abnormalities of gait and mobility: Secondary | ICD-10-CM | POA: Diagnosis not present

## 2021-02-25 DIAGNOSIS — Z01812 Encounter for preprocedural laboratory examination: Secondary | ICD-10-CM | POA: Diagnosis not present

## 2021-02-27 DIAGNOSIS — K648 Other hemorrhoids: Secondary | ICD-10-CM | POA: Diagnosis not present

## 2021-02-27 DIAGNOSIS — Z1211 Encounter for screening for malignant neoplasm of colon: Secondary | ICD-10-CM | POA: Diagnosis not present

## 2021-02-27 DIAGNOSIS — K5730 Diverticulosis of large intestine without perforation or abscess without bleeding: Secondary | ICD-10-CM | POA: Diagnosis not present

## 2021-02-27 DIAGNOSIS — D120 Benign neoplasm of cecum: Secondary | ICD-10-CM | POA: Diagnosis not present

## 2021-02-27 HISTORY — PX: COLONOSCOPY W/ BIOPSIES: SHX1374

## 2021-03-05 DIAGNOSIS — D120 Benign neoplasm of cecum: Secondary | ICD-10-CM | POA: Diagnosis not present

## 2021-03-07 DIAGNOSIS — R269 Unspecified abnormalities of gait and mobility: Secondary | ICD-10-CM | POA: Diagnosis not present

## 2021-03-11 ENCOUNTER — Ambulatory Visit (INDEPENDENT_AMBULATORY_CARE_PROVIDER_SITE_OTHER): Payer: Medicare HMO | Admitting: Physician Assistant

## 2021-03-11 ENCOUNTER — Other Ambulatory Visit: Payer: Medicare HMO

## 2021-03-11 ENCOUNTER — Other Ambulatory Visit: Payer: Self-pay

## 2021-03-11 ENCOUNTER — Encounter: Payer: Self-pay | Admitting: Physician Assistant

## 2021-03-11 ENCOUNTER — Ambulatory Visit (INDEPENDENT_AMBULATORY_CARE_PROVIDER_SITE_OTHER): Payer: Medicare HMO

## 2021-03-11 VITALS — BP 118/59 | HR 66 | Temp 96.7°F | Ht 61.5 in | Wt 152.4 lb

## 2021-03-11 DIAGNOSIS — S50311A Abrasion of right elbow, initial encounter: Secondary | ICD-10-CM | POA: Diagnosis not present

## 2021-03-11 DIAGNOSIS — S0101XA Laceration without foreign body of scalp, initial encounter: Secondary | ICD-10-CM

## 2021-03-11 DIAGNOSIS — I10 Essential (primary) hypertension: Secondary | ICD-10-CM | POA: Diagnosis not present

## 2021-03-11 DIAGNOSIS — M79621 Pain in right upper arm: Secondary | ICD-10-CM

## 2021-03-11 DIAGNOSIS — E785 Hyperlipidemia, unspecified: Secondary | ICD-10-CM

## 2021-03-11 DIAGNOSIS — Z23 Encounter for immunization: Secondary | ICD-10-CM | POA: Diagnosis not present

## 2021-03-11 DIAGNOSIS — S0990XA Unspecified injury of head, initial encounter: Secondary | ICD-10-CM

## 2021-03-11 DIAGNOSIS — W19XXXA Unspecified fall, initial encounter: Secondary | ICD-10-CM

## 2021-03-11 DIAGNOSIS — Z043 Encounter for examination and observation following other accident: Secondary | ICD-10-CM | POA: Diagnosis not present

## 2021-03-11 DIAGNOSIS — R634 Abnormal weight loss: Secondary | ICD-10-CM | POA: Diagnosis not present

## 2021-03-11 DIAGNOSIS — E039 Hypothyroidism, unspecified: Secondary | ICD-10-CM | POA: Diagnosis not present

## 2021-03-11 DIAGNOSIS — M25511 Pain in right shoulder: Secondary | ICD-10-CM | POA: Diagnosis not present

## 2021-03-11 DIAGNOSIS — R296 Repeated falls: Secondary | ICD-10-CM | POA: Diagnosis not present

## 2021-03-11 LAB — CBC WITH DIFFERENTIAL/PLATELET
Basophils Absolute: 0.1 10*3/uL (ref 0.0–0.1)
Basophils Relative: 1 % (ref 0.0–3.0)
Eosinophils Absolute: 0.3 10*3/uL (ref 0.0–0.7)
Eosinophils Relative: 4.8 % (ref 0.0–5.0)
HCT: 39.3 % (ref 36.0–46.0)
Hemoglobin: 13.3 g/dL (ref 12.0–15.0)
Lymphocytes Relative: 27.3 % (ref 12.0–46.0)
Lymphs Abs: 1.6 10*3/uL (ref 0.7–4.0)
MCHC: 33.8 g/dL (ref 30.0–36.0)
MCV: 97.7 fl (ref 78.0–100.0)
Monocytes Absolute: 0.6 10*3/uL (ref 0.1–1.0)
Monocytes Relative: 10.8 % (ref 3.0–12.0)
Neutro Abs: 3.3 10*3/uL (ref 1.4–7.7)
Neutrophils Relative %: 56.1 % (ref 43.0–77.0)
Platelets: 274 10*3/uL (ref 150.0–400.0)
RBC: 4.02 Mil/uL (ref 3.87–5.11)
RDW: 13.2 % (ref 11.5–15.5)
WBC: 5.9 10*3/uL (ref 4.0–10.5)

## 2021-03-11 LAB — LIPID PANEL
Cholesterol: 161 mg/dL (ref 0–200)
HDL: 70.6 mg/dL (ref >39.00)
LDL Cholesterol: 51 mg/dL (ref 0–99)
NonHDL: 90.66
Total CHOL/HDL Ratio: 2
Triglycerides: 198 mg/dL — ABNORMAL HIGH (ref 0.0–149.0)
VLDL: 39.6 mg/dL (ref 0.0–40.0)

## 2021-03-11 LAB — COMPREHENSIVE METABOLIC PANEL
ALT: 13 U/L (ref 0–35)
AST: 16 U/L (ref 0–37)
Albumin: 4.3 g/dL (ref 3.5–5.2)
Alkaline Phosphatase: 70 U/L (ref 39–117)
BUN: 17 mg/dL (ref 6–23)
CO2: 28 mEq/L (ref 19–32)
Calcium: 9.9 mg/dL (ref 8.4–10.5)
Chloride: 104 mEq/L (ref 96–112)
Creatinine, Ser: 0.79 mg/dL (ref 0.40–1.20)
GFR: 72.18 mL/min (ref >60.00)
Glucose, Bld: 108 mg/dL — ABNORMAL HIGH (ref 70–99)
Potassium: 4.3 mEq/L (ref 3.5–5.1)
Sodium: 143 mEq/L (ref 135–145)
Total Bilirubin: 0.6 mg/dL (ref 0.2–1.2)
Total Protein: 6.7 g/dL (ref 6.0–8.3)

## 2021-03-11 LAB — TSH: TSH: 0.21 u[IU]/mL — ABNORMAL LOW (ref 0.35–4.50)

## 2021-03-11 NOTE — Patient Instructions (Addendum)
Please stop by XRAY today and I will call with official results. Monitor your scalp and elbow wounds.  Clean elbow gently with soap and water. Pat dry. Leave scalp area dry for at least 24 hours. May take gentle shower, but do not soak scalp area where Dermabond was applied.  **MOST IMPORTANT - IF ANY SIGNS OF DIZZINESS, CONFUSION, NAUSEA, VOMITING, EXTREME HEAD PAIN OR VISION CHANGES, GO STRAIGHT TO THE ER!!!**   Tissue Adhesive Wound Care Some cuts and wounds can be closed with skin glue (tissue adhesive). Skin glue holds the skin together and helps your wound heal faster. Skin glue goes away on its own as your wound gets better. It is important to take good care of your wound at home while it heals. Follow these instructions at home: Wound care  If a bandage (dressing) was put on the wound, keep it clean and dry.  Follow instructions from your doctor about how often to change the bandage. ? Wash your hands for at least 20 seconds with soap and water before and after you change your bandage. If you cannot use soap and water, use hand sanitizer. ? Change the bandage as often as told by your doctor. ? Leave skin glue in place. It will fall off on its own after 7-10 days.  Do not scratch, rub, or pick at the skin glue.  Do not put tape over the skin glue. The skin glue could come off when you take the tape off.  Protect the wound from another injury.  Check your wound area every day for signs of infection. Check for: ? More redness, swelling, or pain. ? Fluid or blood. ? Warmth. ? Pus or a bad smell.      Bathing  Do not take baths, swim, or use a hot tub until your doctor approves. You may only be allowed to take sponge baths. Ask your doctor if you may take showers. ? Showers are usually allowed 24 hours after treatment. ? Cover the dressing with a watertight covering when you take a shower.  Do not soak the area where skin glue has been used.  Do not use soaps or creams on  your wound. Eating and drinking  Eat healthy foods to help the wound heal. As told by your doctor, eat a diet that includes protein, vitamin A, vitamin C, and other nutrients. You should eat: ? Foods rich in protein. These include meat, fish, eggs, dairy, beans, and nuts. ? Foods rich in vitamin A. These include carrots and dark green, leafy vegetables. ? Foods rich in vitamin C. These include oranges, tomatoes, broccoli, and peppers.  Drink enough fluid to keep your pee (urine) pale yellow. General instructions  Protect your wound from the sun when you are outside for the first 6 months, or for as long as told by your doctor. Put on sunscreen with an SPF of 30 or higher around the scar, or cover it up.  Take over-the-counter and prescription medicines only as told by your doctor.  Do not use any products that contain nicotine or tobacco, such as cigarettes, e-cigarettes, and chewing tobacco. These can delay wound healing. If you need help quitting, ask your doctor.  Keep all follow-up visits as told by your doctor. This is important. Contact a doctor if:  The glue used on your wound gets soaked with blood or falls off before your wound has healed. The glue may need to be replaced.  You have a fever or chills. Get help  right away if:  Your wound breaks open.  You have any of these signs of infection: ? More redness, swelling, or pain around your wound. ? A red streak at the area around your wound. ? Fluid or blood coming from your wound. ? Warmth coming from your wound. ? Pus or a bad smell coming from your wound.  You get a rash after the glue is put on. Summary  Some cuts and wounds can be closed with skin glue (tissue adhesive). Skin glue holds the skin together and helps your wound heal faster.  It is important to take good care of your wound at home while it heals.  Wash your hands for at least 20 seconds with soap and water before and after you change your  bandage.  Eat healthy foods.  Check your wound every day for signs of infection. This information is not intended to replace advice given to you by your health care provider. Make sure you discuss any questions you have with your health care provider. Document Revised: 11/04/2019 Document Reviewed: 11/04/2019 Elsevier Patient Education  2021 [employer].

## 2021-03-11 NOTE — Progress Notes (Signed)
Acute Office Visit  Subjective:    Patient ID: [name], female    DOB: Mar 05, 1941, 80 y.o.   MRN: [id number]  Chief Complaint  Patient presents with  . Laceration    Back of head  . Abrasion    Right arm    HPI Patient is in today for head trauma 2/2 fall around midnight last night. Appointment was initially for labs only today, but told check-in upon arrival she needed to be seen for these issues as well. She is here with her husband. She has hx of recurrent falls, currently doing PT. She says she lost balance as she was going to bed and hit her head on her dresser. Now having R elbow pain and upper arm pain. Says her head feels ok. Denies loss of consciousness. She is not on a blood thinner. States she has not hit her head previously. No N/V or dizziness. No confusion. She was able to walk about 1/2 mile yesterday and thinks her legs were tired from activity.   Past Medical History:  Diagnosis Date  . Allergy   . Arthritis   . Cancer [facility])    kidney, seeing dr in [place]  . Grave's disease   . Reflux     Past Surgical History:  Procedure Laterality Date  . ABDOMINAL HYSTERECTOMY    . SALIVARY GLAND SURGERY    . TRIGGER FINGER RELEASE  2018  . TRIGGER FINGER RELEASE  2018    Family History  Problem Relation Age of Onset  . Kidney disease Mother   . Cancer - Lung Father   . Mental illness Maternal Grandmother     Social History   Socioeconomic History  . Marital status: Married    Spouse name: Lyndle Herrlich  . Number of children: 2  . Years of education: Dental assitant  . Highest education level: Not on file  Occupational History  . Not on file  Tobacco Use  . Smoking status: Former Smoker    Packs/day: 0.25    Years: 20.00    Pack years: 5.00    Types: Cigarettes    Quit date: 05/01/2004    Years since quitting: 16.8  . Smokeless tobacco: Never Used  Vaping Use  . Vaping Use: Never used  Substance and Sexual Activity  . Alcohol use: Never  .  Drug use: Never  . Sexual activity: Not Currently    Birth control/protection: Surgical  Other Topics Concern  . Not on file  Social History Narrative   Lives with husband, Vinny   Caffeine use: Drinks coffee daily   Right handed   Social Determinants of Health   Financial Resource Strain: Not on file  Food Insecurity: Not on file  Transportation Needs: Not on file  Physical Activity: Not on file  Stress: Not on file  Social Connections: Not on file  Intimate Partner Violence: Not on file    Outpatient Medications Prior to Visit  Medication Sig Dispense Refill  . Calcium 150 MG TABS 1 tab    . Cholecalciferol (VITAMIN D3 PO) Take by mouth.    Marland Kitchen lisinopril (ZESTRIL) 10 MG tablet Take 1 tablet by mouth once daily 90 tablet 0  . lovastatin (MEVACOR) 20 MG tablet Take 1 tablet (20 mg total) by mouth at bedtime. 90 tablet 1  . Magnesium 100 MG CAPS Take by mouth.    . Multiple Vitamin (MULTIVITAMIN WITH MINERALS) TABS tablet Take 1 tablet by mouth daily.    . pantoprazole (  PROTONIX) 40 MG tablet 1 tablet    . Pediatric Multivitamins-Fl (MULTIVITAMINS/FL PO) 1 tab    . SYNTHROID 88 MCG tablet Take 1 tablet (88 mcg total) by mouth daily before breakfast. 90 tablet 0   No facility-administered medications prior to visit.    Allergies  Allergen Reactions  . Bactrim Other (See Comments)    Unsure childhood reaction.  . Erythromycin Hives    Broke out in hives  . Sulfa Antibiotics Other (See Comments)  . Nitrofurantoin Monohyd Macro Rash  . Other Rash  . Vicodin [Hydrocodone-Acetaminophen] Rash    Review of Systems  Constitutional: Negative for chills and fever.  Respiratory: Negative for shortness of breath.   Cardiovascular: Negative for chest pain.  Gastrointestinal: Negative for nausea and vomiting.  Musculoskeletal: Positive for arthralgias and gait problem.  Neurological: Positive for weakness. Negative for dizziness, syncope and headaches.  Psychiatric/Behavioral:  Negative for confusion.       Objective:    Physical Exam Vitals and nursing note reviewed.  Constitutional:      Appearance: Normal appearance.  HENT:     Right Ear: Tympanic membrane normal.     Left Ear: Tympanic membrane normal.     Mouth/Throat:     Mouth: Mucous membranes are moist.  Eyes:     Extraocular Movements: Extraocular movements intact.     Pupils: Pupils are equal, round, and reactive to light.  Cardiovascular:     Rate and Rhythm: Normal rate and regular rhythm.     Pulses: Normal pulses.     Heart sounds: No murmur heard.   Pulmonary:     Effort: Pulmonary effort is normal. No respiratory distress.     Breath sounds: Normal breath sounds.  Musculoskeletal:     Comments: TTP ALONG UPPER RIGHT ARM AND ELBOW. ROM IS VERY LIMITED SECONDARY TO PAIN IN HER SHOULDER, ARM, AND ELBOW. N/V INTACT. GOOD STRENGTH IN UPPER EXTREMITIES.  Skin:    Comments: POSTERIOR SCALP - THERE IS AN APPROX 0.5 CM LINEAR LACERATION WITH SOME SURROUNDING EDEMA AND TTP. NO FOREIGN BODIES NOTED ON EXAM.  R ELBOW - 3-4 CM OPEN SKIN ABRASION  Neurological:     Mental Status: She is alert. Mental status is at baseline.     Cranial Nerves: Cranial nerves are intact.     Comments: SHE IS LAUGHING AND JOKING, VERY A&O X 3     BP (!) 118/59   Pulse 66   Temp (!) 96.7 F (35.9 C)   Ht 5' 1.5" (1.562 m)   Wt 152 lb 6.1 oz (69.1 kg)   SpO2 97%   BMI 28.33 kg/m  Wt Readings from Last 3 Encounters:  03/11/21 152 lb 6.1 oz (69.1 kg)  02/19/21 155 lb 4 oz (70.4 kg)  12/06/20 161 lb (73 kg)     Assessment & Plan:   Problem List Items Addressed This Visit      Cardiovascular and Mediastinum   Essential hypertension    Other Visit Diagnoses    Fall, initial encounter    -  Primary   Relevant Orders   DG Elbow Complete Right   DG Shoulder Right   Traumatic injury of head, initial encounter       Laceration of occipital region of scalp without complication, initial encounter        Relevant Orders   Tdap vaccine greater than or equal to 7yo IM (Completed)   Pain of right upper arm       Relevant  Orders   DG Elbow Complete Right   DG Shoulder Right   Abrasion of right elbow, initial encounter       Relevant Orders   DG Elbow Complete Right   Need for tetanus booster       Relevant Orders   Tdap vaccine greater than or equal to 7yo IM (Completed)   Recurrent falls       Acquired hypothyroidism       Hyperlipidemia, unspecified hyperlipidemia type       Weight loss, unintentional         80 year old pleasant Caucasian patient here with her husband originally only for labs today, but then is also able to be seen for injuries status post fall last night.  Neurologic exam is at baseline for patient.  She did hit her head on the dresser last night, but did not lose consciousness.  She had a very small linear abrasion on her scalp that I was able to approximate with the Dermabond in the office today after proper cleaning.  She tolerated this procedure well and aftercare instructions were given to her, see AVS.  I did not feel that it was necessary to do a CT scan of her head at this time.  Patient's tetanus vaccination was also updated in office today.  I also ordered x-rays of her shoulder and elbow, but was unable to view them in the office today.  I will wait for official reading from radiology and call with report.  She may treat with Tylenol for pain at this time.  For the abrasion on her arm she just needs to keep the area covered and then clean with gentle soap and pat dry once daily.  They know to watch for signs of infection.  I also informed him of red flag symptoms to go immediately to the emergency department for including severe headache, confusion, nausea or vomiting, or other acute concerns.  I will see her back in a few days to review her labs.  They will call before then if there are any other concerns.  Total time spent with patient and her husband at this  encounter today reviewing history, performing physical exam, Dermabond procedure, obtaining x-rays, and documentation was 65 minutes.  This note was prepared with assistance of Systems analyst. Occasional wrong-word or sound-a-like substitutions may have occurred due to the inherent limitations of voice recognition software.  This visit occurred during the SARS-CoV-2 public health emergency.  Safety protocols were in place, including screening questions prior to the visit, additional usage of staff PPE, and extensive cleaning of exam room while observing appropriate contact time as indicated for disinfecting solutions.    Jameire Kouba M Burleigh Brockmann, PA-C

## 2021-03-13 DIAGNOSIS — Z6828 Body mass index (BMI) 28.0-28.9, adult: Secondary | ICD-10-CM | POA: Diagnosis not present

## 2021-03-13 DIAGNOSIS — Z124 Encounter for screening for malignant neoplasm of cervix: Secondary | ICD-10-CM | POA: Diagnosis not present

## 2021-03-13 DIAGNOSIS — Z1231 Encounter for screening mammogram for malignant neoplasm of breast: Secondary | ICD-10-CM | POA: Diagnosis not present

## 2021-03-14 ENCOUNTER — Ambulatory Visit (INDEPENDENT_AMBULATORY_CARE_PROVIDER_SITE_OTHER): Payer: Medicare HMO | Admitting: Physician Assistant

## 2021-03-14 ENCOUNTER — Encounter: Payer: Self-pay | Admitting: Physician Assistant

## 2021-03-14 ENCOUNTER — Other Ambulatory Visit: Payer: Self-pay

## 2021-03-14 VITALS — BP 120/66 | HR 72 | Temp 97.2°F | Ht 61.5 in | Wt 150.0 lb

## 2021-03-14 DIAGNOSIS — I10 Essential (primary) hypertension: Secondary | ICD-10-CM | POA: Diagnosis not present

## 2021-03-14 DIAGNOSIS — E039 Hypothyroidism, unspecified: Secondary | ICD-10-CM

## 2021-03-14 DIAGNOSIS — R2681 Unsteadiness on feet: Secondary | ICD-10-CM

## 2021-03-14 DIAGNOSIS — W19XXXD Unspecified fall, subsequent encounter: Secondary | ICD-10-CM

## 2021-03-14 DIAGNOSIS — E785 Hyperlipidemia, unspecified: Secondary | ICD-10-CM

## 2021-03-14 DIAGNOSIS — Z124 Encounter for screening for malignant neoplasm of cervix: Secondary | ICD-10-CM | POA: Diagnosis not present

## 2021-03-14 MED ORDER — LEVOTHYROXINE SODIUM 75 MCG PO TABS: 75.0000 ug | ORAL_TABLET | Freq: Every day | ORAL | 0 refills | Status: DC

## 2021-03-14 NOTE — Progress Notes (Signed)
Established Patient Office Visit  Subjective:  Patient ID: [name], female    DOB: October 10, 1941  Age: 80 y.o. MRN: [id number]  CC:  Chief Complaint  Patient presents with  . Follow-up    HPI [name] presents for follow-up of labs and follow-up since fall on 03/11/21.  She has not had any confusion, vomiting, head pain, or other changes since our visit.  The laceration on her head seems to be healing with the Dermabond.  Her husband has been keeping an eye on this.  She is taking care of the abrasion on her arm.  Her x-rays of her right shoulder and elbow were negative for fracture.  She is still complaining of some soreness in her right upper arm.  Patient and her husband are ready to go over her labs today with me.  Patient is also very concerned about her mobility and why her legs are still so weak.  See my first note from 02-19-21.  She did have work-up with neurology done and has possible dementia and Parkinson's disease.  She is still working with physical therapy.  She does get confused easily about her medical care.  Past Medical History:  Diagnosis Date  . Allergy   . Arthritis   . Cancer [facility])    kidney, seeing dr in [place]  . Grave's disease   . Reflux     Past Surgical History:  Procedure Laterality Date  . ABDOMINAL HYSTERECTOMY    . SALIVARY GLAND SURGERY    . TRIGGER FINGER RELEASE  2018  . TRIGGER FINGER RELEASE  2018    Family History  Problem Relation Age of Onset  . Kidney disease Mother   . Cancer - Lung Father   . Mental illness Maternal Grandmother     Social History   Socioeconomic History  . Marital status: Married    Spouse name: Lyndle Herrlich  . Number of children: 2  . Years of education: Dental assitant  . Highest education level: Not on file  Occupational History  . Not on file  Tobacco Use  . Smoking status: Former Smoker    Packs/day: 0.25    Years: 20.00    Pack years: 5.00    Types: Cigarettes    Quit date:  05/01/2004    Years since quitting: 16.8  . Smokeless tobacco: Never Used  Vaping Use  . Vaping Use: Never used  Substance and Sexual Activity  . Alcohol use: Never  . Drug use: Never  . Sexual activity: Not Currently    Birth control/protection: Surgical  Other Topics Concern  . Not on file  Social History Narrative   Lives with husband, Vinny   Caffeine use: Drinks coffee daily   Right handed   Social Determinants of Health   Financial Resource Strain: Not on file  Food Insecurity: Not on file  Transportation Needs: Not on file  Physical Activity: Not on file  Stress: Not on file  Social Connections: Not on file  Intimate Partner Violence: Not on file    Outpatient Medications Prior to Visit  Medication Sig Dispense Refill  . Calcium 150 MG TABS 1 tab    . Cholecalciferol (VITAMIN D3 PO) Take by mouth.    Marland Kitchen lisinopril (ZESTRIL) 10 MG tablet Take 1 tablet by mouth once daily 90 tablet 0  . Magnesium 100 MG CAPS Take by mouth.    . Multiple Vitamin (MULTIVITAMIN WITH MINERALS) TABS tablet Take 1 tablet by mouth daily.    Marland Kitchen  pantoprazole (PROTONIX) 40 MG tablet 1 tablet    . Pediatric Multivitamins-Fl (MULTIVITAMINS/FL PO) 1 tab    . lovastatin (MEVACOR) 20 MG tablet Take 1 tablet (20 mg total) by mouth at bedtime. 90 tablet 1  . SYNTHROID 88 MCG tablet Take 1 tablet (88 mcg total) by mouth daily before breakfast. 90 tablet 0   No facility-administered medications prior to visit.    Allergies  Allergen Reactions  . Bactrim Other (See Comments)    Unsure childhood reaction.  . Erythromycin Hives    Broke out in hives  . Sulfa Antibiotics Other (See Comments)  . Nitrofurantoin Monohyd Macro Rash  . Other Rash  . Vicodin [Hydrocodone-Acetaminophen] Rash    ROS Review of Systems  Constitutional: Negative for chills and fever.  Respiratory: Negative for shortness of breath.   Cardiovascular: Negative for chest pain.  Gastrointestinal: Negative for nausea and  vomiting.  Musculoskeletal: Positive for arthralgias and gait problem.  Neurological: Positive for weakness. Negative for dizziness, syncope and headaches.      Objective:    Physical Exam Vitals and nursing note reviewed.  Constitutional:      Appearance: Normal appearance.  HENT:     Right Ear: Tympanic membrane normal.     Left Ear: Tympanic membrane normal.     Mouth/Throat:     Mouth: Mucous membranes are moist.  Eyes:     Extraocular Movements: Extraocular movements intact.     Pupils: Pupils are equal, round, and reactive to light.  Cardiovascular:     Rate and Rhythm: Normal rate and regular rhythm.     Pulses: Normal pulses.     Heart sounds: No murmur heard.   Pulmonary:     Effort: Pulmonary effort is normal. No respiratory distress.     Breath sounds: Normal breath sounds.  Musculoskeletal:     Comments: TTP ALONG UPPER RIGHT ARM AND ELBOW. ROM IS SLIGHTLY BETTER TODAY. N/V INTACT. GOOD STRENGTH IN UPPER EXTREMITIES.  Skin:    Comments: POSTERIOR SCALP - THERE IS AN APPROX 0.5 CM LINEAR LACERATION HEALING WELL SINCE DERMABOND. NO SURROUNDING EDEMA OR ERYTHEMA.  R ELBOW - 3-4 CM OPEN SKIN ABRASION  Neurological:     Mental Status: She is alert. Mental status is at baseline.     Cranial Nerves: Cranial nerves are intact.     BP 120/66   Pulse 72   Temp (!) 97.2 F (36.2 C)   Ht 5' 1.5" (1.562 m)   Wt 150 lb (68 kg)   SpO2 97%   BMI 27.88 kg/m  Wt Readings from Last 3 Encounters:  03/14/21 150 lb (68 kg)  03/11/21 152 lb 6.1 oz (69.1 kg)  02/19/21 155 lb 4 oz (70.4 kg)      Lab Results  Component Value Date   TSH 0.21 (L) 03/11/2021   Lab Results  Component Value Date   WBC 5.9 03/11/2021   HGB 13.3 03/11/2021   HCT 39.3 03/11/2021   MCV 97.7 03/11/2021   PLT 274.0 03/11/2021   Lab Results  Component Value Date   NA 143 03/11/2021   K 4.3 03/11/2021   CO2 28 03/11/2021   GLUCOSE 108 (H) 03/11/2021   BUN 17 03/11/2021   CREATININE  0.79 03/11/2021   BILITOT 0.6 03/11/2021   ALKPHOS 70 03/11/2021   AST 16 03/11/2021   ALT 13 03/11/2021   PROT 6.7 03/11/2021   ALBUMIN 4.3 03/11/2021   CALCIUM 9.9 03/11/2021   GFR 72.18 03/11/2021  Lab Results  Component Value Date   CHOL 161 03/11/2021   Lab Results  Component Value Date   HDL 70.60 03/11/2021   Lab Results  Component Value Date   LDLCALC 51 03/11/2021   Lab Results  Component Value Date   TRIG 198.0 (H) 03/11/2021   Lab Results  Component Value Date   CHOLHDL 2 03/11/2021      Assessment & Plan:   Problem List Items Addressed This Visit      Cardiovascular and Mediastinum   Essential hypertension    Other Visit Diagnoses    Fall, subsequent encounter    -  Primary   Acquired hypothyroidism       Relevant Medications   levothyroxine (SYNTHROID) 75 MCG tablet   Hyperlipidemia, unspecified hyperlipidemia type       Gait instability          Meds ordered this encounter  Medications  . levothyroxine (SYNTHROID) 75 MCG tablet    Sig: Take 1 tablet (75 mcg total) by mouth daily before breakfast.    Dispense:  90 tablet    Refill:  0    Follow-up: No follow-ups on file.   1. Fall, subsequent encounter She is in stable condition.  Head laceration has healed nicely.  Abrasion on right forearm is about the same as it was on initial review.  She will continue to care for this at home with her husband.  X-rays were negative of her shoulder and elbow on the right side.  She will let me know if she continues to have pain in this right arm a few weeks from now and we may need to do a further work-up at that time.  Most likely this is strained from the fall and will take some time for it to heal.  2. Acquired hypothyroidism Her TSH is still low.  I am reducing her levothyroxine from 88 mcg to 75 mcg.  We will recheck this lab in about 3 months.  3. Hyperlipidemia, unspecified hyperlipidemia type Her cholesterol looks very good overall.   Triglycerides were somewhat elevated and she was encouraged to work on some diet changes.  Both husband and patient were interested in coming off of the lovastatin.  We will do a trial without this medication and again recheck cholesterol levels in 3 months.  4. Essential hypertension She is stable on lisinopril 10 mg daily.  5. Gait instability She will continue to work with physical therapy.  She needs to follow-up with neurology as scheduled.  This note was prepared with assistance of Systems analyst. Occasional wrong-word or sound-a-like substitutions may have occurred due to the inherent limitations of voice recognition software.  This visit occurred during the SARS-CoV-2 public health emergency.  Safety protocols were in place, including screening questions prior to the visit, additional usage of staff PPE, and extensive cleaning of exam room while observing appropriate contact time as indicated for disinfecting solutions.     Paco Cislo M Clavin Ruhlman, PA-C

## 2021-03-14 NOTE — Patient Instructions (Addendum)
STOP the Lovastatin (for cholesterol). STOP the Synthroid 88 mcg. START the Levothyroxine 75 mcg daily.   Recheck fasting lipid panel and TSH with lab tech in 3 months - adjust medications if needed. Follow up with Harshaan Whang in 4 months.  Continue to do physical therapy at home. Keep up mobility to keep legs strong.  Your head looks great! You can shower. Continue to wash and pat dry the abrasion on arm. Cover with Vaseline and bandage.   Call if any concerns.

## 2021-03-18 ENCOUNTER — Other Ambulatory Visit: Payer: Self-pay | Admitting: Obstetrics & Gynecology

## 2021-03-18 DIAGNOSIS — R928 Other abnormal and inconclusive findings on diagnostic imaging of breast: Secondary | ICD-10-CM

## 2021-03-19 DIAGNOSIS — M79672 Pain in left foot: Secondary | ICD-10-CM | POA: Diagnosis not present

## 2021-03-19 DIAGNOSIS — M2041 Other hammer toe(s) (acquired), right foot: Secondary | ICD-10-CM | POA: Diagnosis not present

## 2021-03-19 DIAGNOSIS — R269 Unspecified abnormalities of gait and mobility: Secondary | ICD-10-CM | POA: Diagnosis not present

## 2021-03-26 ENCOUNTER — Other Ambulatory Visit: Payer: Self-pay | Admitting: Cardiology

## 2021-03-26 ENCOUNTER — Encounter: Payer: Self-pay | Admitting: Physician Assistant

## 2021-03-26 DIAGNOSIS — I10 Essential (primary) hypertension: Secondary | ICD-10-CM

## 2021-03-28 ENCOUNTER — Other Ambulatory Visit: Payer: Self-pay | Admitting: Cardiology

## 2021-03-28 DIAGNOSIS — I10 Essential (primary) hypertension: Secondary | ICD-10-CM

## 2021-04-08 ENCOUNTER — Other Ambulatory Visit: Payer: Self-pay

## 2021-04-08 ENCOUNTER — Other Ambulatory Visit: Payer: Self-pay | Admitting: Obstetrics & Gynecology

## 2021-04-08 ENCOUNTER — Ambulatory Visit
Admission: RE | Admit: 2021-04-08 | Discharge: 2021-04-08 | Disposition: A | Discharge status: Home / Self Care | Payer: Medicare HMO | Source: Ambulatory Visit | Attending: Obstetrics & Gynecology | Admitting: Obstetrics & Gynecology

## 2021-04-08 DIAGNOSIS — R928 Other abnormal and inconclusive findings on diagnostic imaging of breast: Secondary | ICD-10-CM | POA: Diagnosis not present

## 2021-04-08 DIAGNOSIS — R922 Inconclusive mammogram: Secondary | ICD-10-CM | POA: Diagnosis not present

## 2021-04-08 DIAGNOSIS — R921 Mammographic calcification found on diagnostic imaging of breast: Secondary | ICD-10-CM | POA: Diagnosis not present

## 2021-04-18 DIAGNOSIS — R32 Unspecified urinary incontinence: Secondary | ICD-10-CM | POA: Diagnosis not present

## 2021-04-18 DIAGNOSIS — N760 Acute vaginitis: Secondary | ICD-10-CM | POA: Diagnosis not present

## 2021-04-19 DIAGNOSIS — L720 Epidermal cyst: Secondary | ICD-10-CM | POA: Diagnosis not present

## 2021-04-19 DIAGNOSIS — L821 Other seborrheic keratosis: Secondary | ICD-10-CM | POA: Diagnosis not present

## 2021-04-19 DIAGNOSIS — D692 Other nonthrombocytopenic purpura: Secondary | ICD-10-CM | POA: Diagnosis not present

## 2021-04-19 DIAGNOSIS — D2261 Melanocytic nevi of right upper limb, including shoulder: Secondary | ICD-10-CM | POA: Diagnosis not present

## 2021-04-19 DIAGNOSIS — L814 Other melanin hyperpigmentation: Secondary | ICD-10-CM | POA: Diagnosis not present

## 2021-04-19 DIAGNOSIS — L218 Other seborrheic dermatitis: Secondary | ICD-10-CM | POA: Diagnosis not present

## 2021-04-19 DIAGNOSIS — D225 Melanocytic nevi of trunk: Secondary | ICD-10-CM | POA: Diagnosis not present

## 2021-04-19 DIAGNOSIS — L738 Other specified follicular disorders: Secondary | ICD-10-CM | POA: Diagnosis not present

## 2021-05-23 ENCOUNTER — Other Ambulatory Visit: Payer: Self-pay | Admitting: Urology

## 2021-05-23 DIAGNOSIS — N2889 Other specified disorders of kidney and ureter: Secondary | ICD-10-CM

## 2021-06-07 ENCOUNTER — Ambulatory Visit
Admission: RE | Admit: 2021-06-07 | Discharge: 2021-06-07 | Disposition: A | Discharge status: Home / Self Care | Payer: Medicare HMO | Source: Ambulatory Visit | Attending: Urology | Admitting: Urology

## 2021-06-07 ENCOUNTER — Other Ambulatory Visit: Payer: Self-pay | Admitting: Physician Assistant

## 2021-06-07 ENCOUNTER — Other Ambulatory Visit: Payer: Self-pay

## 2021-06-07 DIAGNOSIS — K8020 Calculus of gallbladder without cholecystitis without obstruction: Secondary | ICD-10-CM | POA: Diagnosis not present

## 2021-06-07 DIAGNOSIS — N2889 Other specified disorders of kidney and ureter: Secondary | ICD-10-CM

## 2021-06-07 DIAGNOSIS — C649 Malignant neoplasm of unspecified kidney, except renal pelvis: Secondary | ICD-10-CM | POA: Diagnosis not present

## 2021-06-07 MED ORDER — IOPAMIDOL (ISOVUE-370) INJECTION 76%
80.0000 mL | Freq: Once | INTRAVENOUS | Status: AC | PRN
  Administered 2021-06-07: 80 mL via INTRAVENOUS

## 2021-06-12 DIAGNOSIS — N2889 Other specified disorders of kidney and ureter: Secondary | ICD-10-CM | POA: Diagnosis not present

## 2021-06-13 ENCOUNTER — Other Ambulatory Visit (INDEPENDENT_AMBULATORY_CARE_PROVIDER_SITE_OTHER): Payer: Medicare HMO

## 2021-06-13 ENCOUNTER — Other Ambulatory Visit: Payer: Self-pay

## 2021-06-13 DIAGNOSIS — E039 Hypothyroidism, unspecified: Secondary | ICD-10-CM

## 2021-06-13 DIAGNOSIS — E785 Hyperlipidemia, unspecified: Secondary | ICD-10-CM | POA: Diagnosis not present

## 2021-06-13 LAB — LIPID PANEL
Cholesterol: 189 mg/dL (ref 0–200)
HDL: 60.9 mg/dL (ref >39.00)
NonHDL: 127.6
Total CHOL/HDL Ratio: 3
Triglycerides: 216 mg/dL — ABNORMAL HIGH (ref 0.0–149.0)
VLDL: 43.2 mg/dL — ABNORMAL HIGH (ref 0.0–40.0)

## 2021-06-13 LAB — LDL CHOLESTEROL, DIRECT: Direct LDL: 55 mg/dL

## 2021-06-13 LAB — TSH: TSH: 0.51 u[IU]/mL (ref 0.35–4.50)

## 2021-06-25 DIAGNOSIS — H04123 Dry eye syndrome of bilateral lacrimal glands: Secondary | ICD-10-CM | POA: Diagnosis not present

## 2021-06-25 DIAGNOSIS — H532 Diplopia: Secondary | ICD-10-CM | POA: Diagnosis not present

## 2021-06-25 DIAGNOSIS — H52203 Unspecified astigmatism, bilateral: Secondary | ICD-10-CM | POA: Diagnosis not present

## 2021-06-25 DIAGNOSIS — H26493 Other secondary cataract, bilateral: Secondary | ICD-10-CM | POA: Diagnosis not present

## 2021-07-04 ENCOUNTER — Other Ambulatory Visit: Payer: Self-pay

## 2021-07-04 ENCOUNTER — Ambulatory Visit (INDEPENDENT_AMBULATORY_CARE_PROVIDER_SITE_OTHER): Payer: Medicare HMO | Admitting: Physician Assistant

## 2021-07-04 ENCOUNTER — Encounter: Payer: Self-pay | Admitting: Physician Assistant

## 2021-07-04 VITALS — BP 131/66 | HR 67 | Temp 97.3°F | Ht 61.5 in

## 2021-07-04 DIAGNOSIS — R413 Other amnesia: Secondary | ICD-10-CM

## 2021-07-04 DIAGNOSIS — R2681 Unsteadiness on feet: Secondary | ICD-10-CM

## 2021-07-04 NOTE — Progress Notes (Signed)
Acute Office Visit  Subjective:    Patient ID: [name], female    DOB: 08-05-1941, 80 y.o.   MRN: [id number]  Chief Complaint  Patient presents with   Leg Pain   Hip Pain    No hx injury pain worsen in the past week or so     HPI Patient is in today for right hip and leg issue x 1 week.  She presents in a wheelchair.  Here with her husband.   Patient denies any falls.  She denies any pain.  She just states that it is harder to get around and feels like her muscles are not working.  She is having trouble getting in and out of a car.  She is also having a more difficult time getting dressed by herself.  She seems to be walking and moving more slowly.  Patient's husband is very concerned.  He states that she made the appointment today without him knowing.  She almost drove to this appointment by herself, but he was able to find out about it and bring her here.  He does not think that she should be driving.  He says that her gait instability is worse.  She also has worsening short-term memory issues and complete loss of bladder control.  She does not have any tremors, but she does write very small.  She talks very softly.  He is very concerned that she has Parkinson's disease. Aug 25th is appt with [facility] for 2nd opinion with neurologist.   Patient lives at home alone with her husband.  They currently do not have any other aids available for assistance.  Husband is looking into home health agencies and thinks this would be beneficial in the near future.  Patient and her husband tend to argue in the exam room about what would be best for their situation.  He tells her that she needs to acknowledge that her condition is not going to get better.   Past Medical History:  Diagnosis Date   Allergy    Arthritis    Cancer ([place])    kidney, seeing dr in [place] disease    Reflux     Past Surgical History:  Procedure Laterality Date   ABDOMINAL HYSTERECTOMY     COLONOSCOPY  W/ BIOPSIES  02/27/2021   Repeat in 5 years   SALIVARY GLAND SURGERY     TRIGGER FINGER RELEASE  2018   TRIGGER FINGER RELEASE  2018    Family History  Problem Relation Age of Onset   Kidney disease Mother    Cancer - Lung Father    Mental illness Maternal Grandmother     Social History   Socioeconomic History   Marital status: Married    Spouse name: Lyndle Herrlich   Number of children: 2   Years of education: Dental assitant   Highest education level: Not on file  Occupational History   Not on file  Tobacco Use   Smoking status: Former    Packs/day: 0.25    Years: 20.00    Pack years: 5.00    Types: Cigarettes    Quit date: 05/01/2004    Years since quitting: 17.1   Smokeless tobacco: Never  Vaping Use   Vaping Use: Never used  Substance and Sexual Activity   Alcohol use: Never   Drug use: Never   Sexual activity: Not Currently    Birth control/protection: Surgical  Other Topics Concern   Not on file  Social History Narrative   Lives with husband, Vinny   Caffeine use: Drinks coffee daily   Right handed   Social Determinants of Health   Financial Resource Strain: Not on file  Food Insecurity: Not on file  Transportation Needs: Not on file  Physical Activity: Not on file  Stress: Not on file  Social Connections: Not on file  Intimate Partner Violence: Not on file    Outpatient Medications Prior to Visit  Medication Sig Dispense Refill   Calcium 150 MG TABS 1 tab     Cholecalciferol (VITAMIN D3 PO) Take by mouth.     levothyroxine (SYNTHROID) 75 MCG tablet TAKE 1 TABLET BY MOUTH DAILY BEFORE BREAKFAST 90 tablet 0   lisinopril (ZESTRIL) 10 MG tablet Take 1 tablet by mouth once daily 90 tablet 0   Magnesium 100 MG CAPS Take by mouth.     Multiple Vitamin (MULTIVITAMIN WITH MINERALS) TABS tablet Take 1 tablet by mouth daily.     pantoprazole (PROTONIX) 40 MG tablet 1 tablet     Pediatric Multivitamins-Fl (MULTIVITAMINS/FL PO) 1 tab     No facility-administered  medications prior to visit.    Allergies  Allergen Reactions   Bactrim Other (See Comments)    Unsure childhood reaction.   Erythromycin Hives    Broke out in hives   Sulfa Antibiotics Other (See Comments)   Nitrofurantoin Monohyd Macro Rash   Other Rash   Vicodin [Hydrocodone-Acetaminophen] Rash    Review of Systems REFER TO HPI FOR PERTINENT POSITIVES AND NEGATIVES     Objective:    Physical Exam Vitals and nursing note reviewed.  Constitutional:      Appearance: Normal appearance. She is normal weight. She is not toxic-appearing.     Comments: Wheelchair to get back to exam room; Uses a cane mostly  HENT:     Head: Normocephalic and atraumatic.     Right Ear: Ear canal and external ear normal.     Left Ear: Ear canal and external ear normal.     Nose: Nose normal.     Mouth/Throat:     Mouth: Mucous membranes are moist.  Eyes:     Extraocular Movements: Extraocular movements intact.     Conjunctiva/sclera: Conjunctivae normal.     Pupils: Pupils are equal, round, and reactive to light.  Cardiovascular:     Rate and Rhythm: Normal rate and regular rhythm.     Pulses: Normal pulses.     Heart sounds: Normal heart sounds.  Pulmonary:     Effort: Pulmonary effort is normal.     Breath sounds: Normal breath sounds.  Musculoskeletal:        General: Normal range of motion.     Cervical back: Normal range of motion and neck supple.  Skin:    General: Skin is warm and dry.  Neurological:     General: No focal deficit present.     Mental Status: She is alert and oriented to person, place, and time.     Cranial Nerves: No cranial nerve deficit.     Sensory: No sensory deficit.     Motor: Weakness (generalized; but overall strength in extremities is equal in all and able to push /pull against resistance) present.     Gait: Gait abnormal (shuffling, unsteady).    BP 131/66   Pulse 67   Temp (!) 97.3 F (36.3 C) (Oral)   Ht 5' 1.5" (1.562 m)   SpO2 98%   BMI  27.88 kg/m  Wt Readings from Last 3 Encounters:  03/14/21 150 lb (68 kg)  03/11/21 152 lb 6.1 oz (69.1 kg)  02/19/21 155 lb 4 oz (70.4 kg)    Health Maintenance Due  Topic Date Due   Hepatitis C Screening  Never done   Zoster Vaccines- Shingrix (1 of 2) Never done   DEXA SCAN  Never done   COVID-19 Vaccine (3 - Booster for Moderna series) 07/01/2020    There are no preventive care reminders to display for this patient.   Lab Results  Component Value Date   TSH 0.51 06/13/2021   Lab Results  Component Value Date   WBC 5.9 03/11/2021   HGB 13.3 03/11/2021   HCT 39.3 03/11/2021   MCV 97.7 03/11/2021   PLT 274.0 03/11/2021   Lab Results  Component Value Date   NA 143 03/11/2021   K 4.3 03/11/2021   CO2 28 03/11/2021   GLUCOSE 108 (H) 03/11/2021   BUN 17 03/11/2021   CREATININE 0.79 03/11/2021   BILITOT 0.6 03/11/2021   ALKPHOS 70 03/11/2021   AST 16 03/11/2021   ALT 13 03/11/2021   PROT 6.7 03/11/2021   ALBUMIN 4.3 03/11/2021   CALCIUM 9.9 03/11/2021   GFR 72.18 03/11/2021   Lab Results  Component Value Date   CHOL 189 06/13/2021   Lab Results  Component Value Date   HDL 60.90 06/13/2021   Lab Results  Component Value Date   LDLCALC 51 03/11/2021   Lab Results  Component Value Date   TRIG 216.0 (H) 06/13/2021   Lab Results  Component Value Date   CHOLHDL 3 06/13/2021   No results found for: HGBA1C     Assessment & Plan:   Problem List Items Addressed This Visit   None Visit Diagnoses     Gait instability    -  Primary   Relevant Orders   Ambulatory referral to Physical Therapy   Memory changes       Relevant Orders   Ambulatory referral to Physical Therapy       1. Gait instability 2. Memory changes Long discussion with patient and husband today.  I do agree that there are some parkinsonian signs that she is displaying.  I reviewed her note from neurology on 01/20/2021 again with them today and MRI of head report did show progression  of hippocampal atrophy bilaterally, which can be seen with Alzheimer's disease.  They are going to have a second opinion with [place] neurolog[facility] next month and let me know how this appointment goes.  She did not have any focal neurologic deficit on exam today.  Her range of motion with her legs and hips were surprisingly good, but she was slow to change position and she did have a very unsteady shuffling gait.  I think she would benefit from some physical therapy until she is able to get in with [facility].  I placed this order today.  They should also consider home health aides in the near future.  I do not think she should be driving and I informed her of this today, much to her dismay though.  She did say she was going to continue driving despite my recommendation.  They also need to continue to work on fall risk prevention at home including adding handrails, covering cords, removing slippery rugs, adding a shower chair, etc.  Total time spent with patient and her husband in office face-to-face, including chart review, history and physical, documentation was 39 minutes.  Randa Evens  Donnie Panik, PA-C

## 2021-07-04 NOTE — Patient Instructions (Signed)
Someone will call about scheduling for physical therapy in our office. I do not think you should be driving anymore.  Please have your husband do all the driving for you. Let me know about your appointment with [facility] and follow-up with me after that appointment.

## 2021-07-17 DIAGNOSIS — N2889 Other specified disorders of kidney and ureter: Secondary | ICD-10-CM | POA: Diagnosis not present

## 2021-07-17 DIAGNOSIS — Z0000 Encounter for general adult medical examination without abnormal findings: Secondary | ICD-10-CM | POA: Diagnosis not present

## 2021-07-18 ENCOUNTER — Ambulatory Visit: Payer: Medicare HMO | Admitting: Physical Therapy

## 2021-07-18 ENCOUNTER — Encounter: Payer: Self-pay | Admitting: Physical Therapy

## 2021-07-18 ENCOUNTER — Other Ambulatory Visit: Payer: Self-pay

## 2021-07-18 DIAGNOSIS — R2689 Other abnormalities of gait and mobility: Secondary | ICD-10-CM | POA: Diagnosis not present

## 2021-07-21 ENCOUNTER — Encounter: Payer: Self-pay | Admitting: Physical Therapy

## 2021-07-21 NOTE — Therapy (Signed)
[facility] [address] [place], [place], [address] Phone: [phone]   Fax:  [phone]  Physical Therapy Evaluation  Patient Details  Name: [name] MRN: [id number] Date of Birth: August 26, 1941 Referring Provider (PT): Alyssa Allwardt   Encounter Date: 07/18/2021   PT End of Session - 07/21/21 1622     Visit Number 1    Number of Visits 12    Date for PT Re-Evaluation 08/29/21    Authorization Type [employer]    PT Start Time 1345    PT Stop Time 1426    PT Time Calculation (min) 41 min    Activity Tolerance Patient tolerated treatment well    Behavior During Therapy [facility] for tasks assessed/performed             Past Medical History:  Diagnosis Date   Allergy    Arthritis    Cancer ([place])    kidney, seeing dr in [place] disease    Reflux     Past Surgical History:  Procedure Laterality Date   ABDOMINAL HYSTERECTOMY     COLONOSCOPY W/ BIOPSIES  02/27/2021   Repeat in 5 years   [place]  2018   TRIGGER FINGER RELEASE  2018    There were no vitals filed for this visit.    Subjective Assessment - 07/21/21 1621     Subjective Pt states difficulty getting up out of chair. She also states decreased balance. Is carrying cane with her today, but not using. Has 2 canes. Does have 4 WW, does not use. Does have stairs at home, bedroom on second level. Has 1 hand rail. Pt states no pain. Has had falls, 1 in house and 1 on porch. Husband drives.    Pertinent History recent CA, kidney, not having treatment at this time, is being monitored, has not changed.    Limitations Lifting;Standing;Walking;House hold activities    Patient Stated Goals strengthen legs, get up out of chair, balance. put socks/shoes on (cant bend down)    Currently in Pain? No/denies                [facility] PT Assessment - 07/21/21 0001       Assessment   Medical Diagnosis Gait and  balance    Referring Provider (PT) Alyssa Allwardt    Prior Therapy no      Precautions   Precautions Fall      Balance Screen   Has the patient fallen in the past 6 months No    Has the patient had a decrease in activity level because of a fear of falling?  Yes    Is the patient reluctant to leave their home because of a fear of falling?  No      Prior Function   Level of Independence Independent      Cognition   Overall Cognitive Status History of cognitive impairments - at baseline      ROM / Strength   AROM / PROM / Strength AROM;Strength      AROM   Overall AROM Comments hips: mild deficits, knees: WNL; Ankles: mild deficit for DF bil;  UE: WFL      Strength   Overall Strength Comments LEs: 4/5, UEs: 4/5;      Ambulation/Gait   Gait Comments low step height, shuffling at times,  limited trunk rotation,      Standardized Balance Assessment   Standardized  Balance Assessment Berg Balance Test      Berg Balance Test   Sit to Stand Able to stand  independently using hands    Standing Unsupported Able to stand safely 2 minutes    Sitting with Back Unsupported but Feet Supported on Floor or Stool Able to sit safely and securely 2 minutes    Stand to Sit Uses backs of legs against chair to control descent    Transfers Able to transfer safely, definite need of hands    Standing Unsupported with Eyes Closed Able to stand 10 seconds safely    Standing Unsupported with Feet Together Able to place feet together independently and stand 1 minute safely    From Standing, Reach Forward with Outstretched Arm Can reach forward >12 cm safely (5")    From Standing Position, Pick up Object from Floor Able to pick up shoe safely and easily    From Standing Position, Turn to Look Behind Over each Shoulder Turn sideways only but maintains balance    Turn 360 Degrees Able to turn 360 degrees safely but slowly    Standing Unsupported, Alternately Place Feet on Step/Stool Able to complete 4  steps without aid or supervision    Standing Unsupported, One Foot in Front Able to take small step independently and hold 30 seconds    Standing on One Leg Able to lift leg independently and hold equal to or more than 3 seconds    Total Score 41                        Objective measurements completed on examination: See above findings.               PT Education - 07/21/21 1622     Education Details PT POC, exam findings.    Person(s) Educated Patient    Methods Explanation;Demonstration;Verbal cues    Comprehension Verbalized understanding;Returned demonstration;Verbal cues required;Need further instruction              PT Short Term Goals - 07/21/21 2058       PT SHORT TERM GOAL #1   Title Pt to be independent with initial HEP    Time 2    Period Weeks    Status New    Target Date 08/01/21      PT SHORT TERM GOAL #2   Title Pt to demo ability to rise from regular height chair, on first attempt consistently, with minimal or no UE support    Time 2    Period Weeks    Status New    Target Date 08/01/21               PT Long Term Goals - 07/21/21 2101       PT LONG TERM GOAL #1   Title Pt to be independent with final HEP    Time 6    Period Weeks    Status New    Target Date 08/29/21      PT LONG TERM GOAL #2   Title Pt to demo improved score on 5 time sit to stand by at least 5 seconds.    Time 6    Period Weeks    Status New    Target Date 08/29/21      PT LONG TERM GOAL #3   Title Pt to demo improved score on TUG by at least 9 points, to improve gait speed and efficiency.    Time  6    Period Weeks    Status New    Target Date 08/29/21      PT LONG TERM GOAL #4   Title Pt to demo improved score on BERG by at least 5 points    Time 6    Period Weeks    Status New    Target Date 08/29/21                    Plan - 07/21/21 2118     Clinical Impression Statement Pt presents with primary complaint of  decreased mobility, strength, balance, and walking. Pt with decreased ability and consistency for sit to stand transfers. She has decreased strength in LEs, and lack of effecive HEP. Pt with decreased scores on BERG and TUG, and poor gait mechanics. Pt with decreased safety and efficiency with gait, with decreased speed, step height, and decreased stability. Pt to benefit from skilled PT to improve gait efficiency, safety, transfers, and ability for functional activity.    Examination-Activity Limitations Locomotion Level;Bend;Transfers;Squat;Stairs    Examination-Participation Restrictions Cleaning;Meal Prep;Community Activity;Shop    Stability/Clinical Decision Making Stable/Uncomplicated    Clinical Decision Making Low    Rehab Potential Good    PT Frequency 2x / week    PT Duration 6 weeks    PT Treatment/Interventions ADLs/Self Care Home Management;Cryotherapy;Electrical Stimulation;DME Instruction;Ultrasound;Traction;Moist Heat;Iontophoresis '4mg'$ /ml Dexamethasone;Gait training;Stair training;Functional mobility training;Therapeutic activities;Therapeutic exercise;Balance training;Neuromuscular re-education;Patient/family education;Manual techniques;Passive range of motion;Dry needling;Energy conservation;Taping;Spinal Manipulations;Joint Manipulations    Consulted and Agree with Plan of Care Patient             Patient will benefit from skilled therapeutic intervention in order to improve the following deficits and impairments:  Abnormal gait, Decreased coordination, Decreased mobility, Decreased strength, Decreased activity tolerance, Decreased range of motion, Decreased balance, Decreased knowledge of precautions, Decreased safety awareness, Difficulty walking  Visit Diagnosis: Other abnormalities of gait and mobility     Problem List Patient Active Problem List   Diagnosis Date Noted   Gait disturbance 05/09/2020   Pain in both lower extremities 09/19/2019   Essential  hypertension 05/02/2019   Exertional dyspnea 05/02/2019   Memory loss 11/20/2017   Muscle weakness 07/17/2017    Lyndee Hensen, PT, DPT 9:35 PM  07/21/21   [place], [place], [address] Phone: [phone]   Fax:  [phone]  Name: [name] MRN: [id number] Date of Birth: 25-Oct-1941

## 2021-07-23 ENCOUNTER — Ambulatory Visit: Payer: Medicare HMO | Admitting: Physician Assistant

## 2021-07-25 ENCOUNTER — Encounter: Payer: Self-pay | Admitting: Physical Therapy

## 2021-07-25 ENCOUNTER — Ambulatory Visit: Payer: Medicare HMO | Admitting: Physical Therapy

## 2021-07-25 ENCOUNTER — Other Ambulatory Visit: Payer: Self-pay

## 2021-07-25 DIAGNOSIS — R2689 Other abnormalities of gait and mobility: Secondary | ICD-10-CM | POA: Diagnosis not present

## 2021-07-25 DIAGNOSIS — R2681 Unsteadiness on feet: Secondary | ICD-10-CM

## 2021-07-25 NOTE — Patient Instructions (Signed)
Access Code: [email or web address] URL: [email or web address] Date: 07/25/2021 Prepared by: Lyndee Hensen  Exercises Seated Knee Extension AROM - 2 x daily - 1-2 sets - 10 reps Sit to Stand - 1 x daily - 1 sets - 10 reps Seated March with Same Side Arm Raise - 1 x daily - 1 sets - 10 reps Standing March with Counter Support - 1 x daily - 2 sets - 10 reps Heel Raises with Counter Support - 1 x daily - 2 sets - 10 reps Seated Scapular Retraction - 2 x daily - 1 sets - 10 reps

## 2021-07-25 NOTE — Therapy (Signed)
[facility] [address] [place], [place], [address] Phone: [phone]   Fax:  [phone]  Physical Therapy Treatment  Patient Details  Name: [name] MRN: [id number] Date of Birth: September 08, 1941 Referring Provider (PT): Alyssa Allwardt   Encounter Date: 07/25/2021   PT End of Session - 07/25/21 1136     Visit Number 2    Number of Visits 12    Date for PT Re-Evaluation 08/29/21    Authorization Type [employer]    PT Start Time 1025    PT Stop Time 1105    PT Time Calculation (min) 40 min    Activity Tolerance Patient tolerated treatment well    Behavior During Therapy [facility] for tasks assessed/performed             Past Medical History:  Diagnosis Date   Allergy    Arthritis    Cancer ([place])    kidney, seeing dr in [place] disease    Reflux     Past Surgical History:  Procedure Laterality Date   ABDOMINAL HYSTERECTOMY     COLONOSCOPY W/ BIOPSIES  02/27/2021   Repeat in 5 years   [place]  2018   TRIGGER FINGER RELEASE  2018    There were no vitals filed for this visit.   Subjective Assessment - 07/25/21 1135     Subjective Pt states "im loosing my balance" and "cant get up from a chair"    Currently in Pain? No/denies                               [facility] Adult PT Treatment/Exercise - 07/25/21 0001       Exercises   Exercises Knee/Hip      Knee/Hip Exercises: Standing   Heel Raises 20 reps    Hip Flexion 20 reps    Gait Training 35 ft x 6 with RW, education on improving step height and improving heel strike, SPC x 50 ft with education on mechanics, No AD, 35 ft x 4;    Other Standing Knee Exercises L/R and staggered stance weight shifts x 20 ea bil;    Other Standing Knee Exercises scap squeezes (for posture) x 20;      Knee/Hip Exercises: Seated   Long Arc Quad 20 reps    Other Seated Knee/Hip Exercises seated march  with opp UE flexion x 10 (for HEP)    Sit to [employer] 10 reps                    PT Education - 07/25/21 1136     Education Details HEP issued for LE strength .    Person(s) Educated Patient    Methods Explanation;Demonstration;Tactile cues;Verbal cues;Handout    Comprehension Verbalized understanding;Returned demonstration;Verbal cues required;Tactile cues required;Need further instruction              PT Short Term Goals - 07/21/21 2058       PT SHORT TERM GOAL #1   Title Pt to be independent with initial HEP    Time 2    Period Weeks    Status New    Target Date 08/01/21      PT SHORT TERM GOAL #2   Title Pt to demo ability to rise from regular height chair, on first attempt consistently, with minimal or no UE support  Time 2    Period Weeks    Status New    Target Date 08/01/21               PT Long Term Goals - 07/21/21 2101       PT LONG TERM GOAL #1   Title Pt to be independent with final HEP    Time 6    Period Weeks    Status New    Target Date 08/29/21      PT LONG TERM GOAL #2   Title Pt to demo improved score on 5 time sit to stand by at least 5 seconds.    Time 6    Period Weeks    Status New    Target Date 08/29/21      PT LONG TERM GOAL #3   Title Pt to demo improved score on TUG by at least 9 points, to improve gait speed and efficiency.    Time 6    Period Weeks    Status New    Target Date 08/29/21      PT LONG TERM GOAL #4   Title Pt to demo improved score on BERG by at least 5 points    Time 6    Period Weeks    Status New    Target Date 08/29/21                   Plan - 07/25/21 1139     Clinical Impression Statement Pt educated on andn HEP issued  for LE strengthening. Gait training with RW today, pt with improved step height and heel strike with use of walker. Pt will benefit from continued gait training for these issues as well as efficiency and balance with walking. Recommended use of RW at home for  walking short distances in house or outside, to practice optimal mechanics. Recommended pt not go too far away from home with walking, she tried to walk in neighborhood this week, and had to have a neighbor drive her back home. Pt with improved ability for sit to stand after education and practice today.    Examination-Activity Limitations Locomotion Level;Bend;Transfers;Squat;Stairs    Examination-Participation Restrictions Cleaning;Meal Prep;Community Activity;Shop    Stability/Clinical Decision Making Stable/Uncomplicated    Rehab Potential Good    PT Frequency 2x / week    PT Duration 6 weeks    PT Treatment/Interventions ADLs/Self Care Home Management;Cryotherapy;Electrical Stimulation;DME Instruction;Ultrasound;Traction;Moist Heat;Iontophoresis '4mg'$ /ml Dexamethasone;Gait training;Stair training;Functional mobility training;Therapeutic activities;Therapeutic exercise;Balance training;Neuromuscular re-education;Patient/family education;Manual techniques;Passive range of motion;Dry needling;Energy conservation;Taping;Spinal Manipulations;Joint Manipulations    Consulted and Agree with Plan of Care Patient             Patient will benefit from skilled therapeutic intervention in order to improve the following deficits and impairments:  Abnormal gait, Decreased coordination, Decreased mobility, Decreased strength, Decreased activity tolerance, Decreased range of motion, Decreased balance, Decreased knowledge of precautions, Decreased safety awareness, Difficulty walking  Visit Diagnosis: Other abnormalities of gait and mobility  Gait instability     Problem List Patient Active Problem List   Diagnosis Date Noted   Gait disturbance 05/09/2020   Pain in both lower extremities 09/19/2019   Essential hypertension 05/02/2019   Exertional dyspnea 05/02/2019   Memory loss 11/20/2017   Muscle weakness 07/17/2017    Lyndee Hensen, PT, DPT 11:45 AM  07/25/21    [facility]  [facility] [address] [place], [place], [address] Phone: [phone]   Fax:  [phone]  Name: [name] MRN: [id number] Date of Birth: 15-Sep-1941

## 2021-07-30 ENCOUNTER — Other Ambulatory Visit: Payer: Self-pay

## 2021-07-30 ENCOUNTER — Encounter: Payer: Self-pay | Admitting: Physical Therapy

## 2021-07-30 ENCOUNTER — Ambulatory Visit: Payer: Medicare HMO | Admitting: Physical Therapy

## 2021-07-30 DIAGNOSIS — R2689 Other abnormalities of gait and mobility: Secondary | ICD-10-CM

## 2021-07-30 DIAGNOSIS — R2681 Unsteadiness on feet: Secondary | ICD-10-CM | POA: Diagnosis not present

## 2021-07-30 NOTE — Therapy (Signed)
[facility] [address] [place], [place], [address] Phone: [phone]   Fax:  [phone]  Physical Therapy Treatment  Patient Details  Name: [name] MRN: [id number] Date of Birth: October 05, 1941 Referring Provider (PT): Alyssa Allwardt   Encounter Date: 07/30/2021   PT End of Session - 07/30/21 1439     Visit Number 3    Number of Visits 12    Date for PT Re-Evaluation 08/29/21    Authorization Type [employer]    PT Start Time 1346    PT Stop Time 1429    PT Time Calculation (min) 43 min    Activity Tolerance Patient tolerated treatment well    Behavior During Therapy [facility] for tasks assessed/performed             Past Medical History:  Diagnosis Date   Allergy    Arthritis    Cancer ([place])    kidney, seeing dr in [place] disease    Reflux     Past Surgical History:  Procedure Laterality Date   ABDOMINAL HYSTERECTOMY     COLONOSCOPY W/ BIOPSIES  02/27/2021   Repeat in 5 years   [place]  2018   TRIGGER FINGER RELEASE  2018    There were no vitals filed for this visit.   Subjective Assessment - 07/30/21 1438     Subjective Pt states "my walking " is not getting better. Also complains of changes in her voice, "its weak"    Pertinent History recent CA, kidney, not having treatment at this time, is being monitored, has not changed.    Currently in Pain? No/denies                               [facility] Adult PT Treatment/Exercise - 07/30/21 0001       Exercises   Exercises Knee/Hip      Knee/Hip Exercises: Standing   Heel Raises --    Hip Flexion 20 reps    Gait Training 35 ft x 8 with SPC, education on improving step height and sequencing with cane;  Lateral stepping 25 ft x 4,  bwd walking 10 ft x 4;    Other Standing Knee Exercises L/R weight shifts x 20 ea bil;  torso turns x 10 ;  reaching outside of BOS at varying heights  x 10 bil;    Other Standing Knee Exercises Rows RTB , at mat (for posture) x 20;      Knee/Hip Exercises: Seated   Long Arc Quad --    Other Seated Knee/Hip Exercises --    Sit to [employer] 15 reps                      PT Short Term Goals - 07/21/21 2058       PT SHORT TERM GOAL #1   Title Pt to be independent with initial HEP    Time 2    Period Weeks    Status New    Target Date 08/01/21      PT SHORT TERM GOAL #2   Title Pt to demo ability to rise from regular height chair, on first attempt consistently, with minimal or no UE support    Time 2    Period Weeks    Status New    Target Date 08/01/21  PT Long Term Goals - 07/21/21 2101       PT LONG TERM GOAL #1   Title Pt to be independent with final HEP    Time 6    Period Weeks    Status New    Target Date 08/29/21      PT LONG TERM GOAL #2   Title Pt to demo improved score on 5 time sit to stand by at least 5 seconds.    Time 6    Period Weeks    Status New    Target Date 08/29/21      PT LONG TERM GOAL #3   Title Pt to demo improved score on TUG by at least 9 points, to improve gait speed and efficiency.    Time 6    Period Weeks    Status New    Target Date 08/29/21      PT LONG TERM GOAL #4   Title Pt to demo improved score on BERG by at least 5 points    Time 6    Period Weeks    Status New    Target Date 08/29/21                   Plan - 07/30/21 1442     Clinical Impression Statement Pt with improved gait mechanics with practice and cueing for slowing speed, and longer stride length. Pt able to ambulate well in clinic today without SPC, but would still recommend use at this time for stability. Pt with improving ability for sit to stand, but still takes 2-3 tries consistently. Pt to benefit from progression of gait and balance, with stairs, and dynamic balance activities.    Examination-Activity Limitations Locomotion Level;Bend;Transfers;Squat;Stairs     Examination-Participation Restrictions Cleaning;Meal Prep;Community Activity;Shop    Stability/Clinical Decision Making Stable/Uncomplicated    Rehab Potential Good    PT Frequency 2x / week    PT Duration 6 weeks    PT Treatment/Interventions ADLs/Self Care Home Management;Cryotherapy;Electrical Stimulation;DME Instruction;Ultrasound;Traction;Moist Heat;Iontophoresis '4mg'$ /ml Dexamethasone;Gait training;Stair training;Functional mobility training;Therapeutic activities;Therapeutic exercise;Balance training;Neuromuscular re-education;Patient/family education;Manual techniques;Passive range of motion;Dry needling;Energy conservation;Taping;Spinal Manipulations;Joint Manipulations    Consulted and Agree with Plan of Care Patient             Patient will benefit from skilled therapeutic intervention in order to improve the following deficits and impairments:  Abnormal gait, Decreased coordination, Decreased mobility, Decreased strength, Decreased activity tolerance, Decreased range of motion, Decreased balance, Decreased knowledge of precautions, Decreased safety awareness, Difficulty walking  Visit Diagnosis: Other abnormalities of gait and mobility  Gait instability     Problem List Patient Active Problem List   Diagnosis Date Noted   Gait disturbance 05/09/2020   Pain in both lower extremities 09/19/2019   Essential hypertension 05/02/2019   Exertional dyspnea 05/02/2019   Memory loss 11/20/2017   Muscle weakness 07/17/2017   Lyndee Hensen, PT, DPT 2:44 PM  07/30/21    [place], [place], [address] Phone: [phone]   Fax:  [phone]  Name: [name] MRN: [id number] Date of Birth: March 31, 1941

## 2021-07-31 ENCOUNTER — Other Ambulatory Visit: Payer: Self-pay

## 2021-07-31 ENCOUNTER — Telehealth: Payer: Self-pay

## 2021-07-31 DIAGNOSIS — R413 Other amnesia: Secondary | ICD-10-CM

## 2021-07-31 DIAGNOSIS — R2681 Unsteadiness on feet: Secondary | ICD-10-CM

## 2021-07-31 NOTE — Telephone Encounter (Signed)
Pt's daughter in law called and she requested a referral to the [facility] for [place]. Can the referral be placed? Please advise.

## 2021-08-01 ENCOUNTER — Ambulatory Visit (INDEPENDENT_AMBULATORY_CARE_PROVIDER_SITE_OTHER): Payer: Medicare HMO

## 2021-08-01 DIAGNOSIS — Z0000 Encounter for general adult medical examination without abnormal findings: Secondary | ICD-10-CM

## 2021-08-01 NOTE — Progress Notes (Signed)
Virtual Visit via Telephone Note  I connected with  [name] on 08/01/21 at  2:30 PM EDT by telephone and verified that I am speaking with the correct person using two identifiers.  Medicare Annual Wellness visit completed telephonically due to Covid-19 pandemic.   Persons participating in this call: This Health Coach and this patient.   Location: Patient: Home Provider: Office   I discussed the limitations, risks, security and privacy concerns of performing an evaluation and management service by telephone and the availability of in person appointments. The patient expressed understanding and agreed to proceed.  Unable to perform video visit due to video visit attempted and failed and/or patient does not have video capability.   Some vital signs may be absent or patient reported.   Willette Brace, LPN   Subjective:   [name] is a 80 y.o. female who presents for an Initial Medicare Annual Wellness Visit.  Review of Systems     Cardiac Risk Factors include: advanced age (>76mn, >>34women);hypertension     Objective:    There were no vitals filed for this visit. There is no height or weight on file to calculate BMI.  Advanced Directives 08/01/2021 07/21/2021 09/21/2017 08/31/2016 04/19/2016  Does Patient Have a Medical Advance Directive? Yes No No Yes No  Type of Advance Directive Living [place] - - [place] -  Does patient want to make changes to medical advance directive? - - - No - Patient declined -  [place] - - - No - copy requested -  Would patient like information on creating a medical advance directive? - No - Patient declined No - Patient declined - No - patient declined information    Current Medications (verified) Outpatient Encounter Medications as of 08/01/2021  Medication Sig   Calcium 150 MG TABS 1 tab   Cholecalciferol (VITAMIN D3 PO) Take by mouth.   [place] (VENASTAT  PO) Take by mouth.   levothyroxine (SYNTHROID) 75 MCG tablet TAKE 1 TABLET BY MOUTH DAILY BEFORE BREAKFAST   lisinopril (ZESTRIL) 10 MG tablet Take 1 tablet by mouth once daily   Magnesium 100 MG CAPS Take by mouth.   Multiple Vitamin (MULTIVITAMIN WITH MINERALS) TABS tablet Take 1 tablet by mouth daily.   pantoprazole (PROTONIX) 40 MG tablet 1 tablet (Patient not taking: Reported on 08/01/2021)   [DISCONTINUED] lovastatin (MEVACOR) 20 MG tablet Take by mouth. (Patient not taking: Reported on 08/01/2021)   [DISCONTINUED] Pediatric Multivitamins-Fl (MULTIVITAMINS/FL PO) 1 tab (Patient not taking: Reported on 08/01/2021)   No facility-administered encounter medications on file as of 08/01/2021.    Allergies (verified) Bactrim, Erythromycin, Sulfa antibiotics, Sulfamethoxazole-trimethoprim, Amoxicillin, Nitrofurantoin monohyd macro, Other, and Vicodin [hydrocodone-acetaminophen]   History: Past Medical History:  Diagnosis Date   Allergy    Arthritis    Cancer ([place]    Reflux    Past Surgical History:  Procedure Laterality Date   ABDOMINAL HYSTERECTOMY     COLONOSCOPY W/ BIOPSIES  02/27/2021   Repeat in 5 years   [place] 2018   TRIGGER FINGER RELEASE  2018   Family History  Problem Relation Age of Onset   Kidney disease Mother    Cancer - Lung Father    Mental illness Maternal Grandmother    Social History   Socioeconomic History  Marital status: Married    Spouse name: Lyndle Herrlich   Number of children: 2   Years of education: Dental assitant   Highest education level: Not on file  Occupational History   Not on file  Tobacco Use   Smoking status: Former    Packs/day: 0.25    Years: 20.00    Pack years: 5.00    Types: Cigarettes    Quit date: 05/01/2004    Years since quitting: 17.2   Smokeless tobacco: Never  Vaping Use   Vaping Use: Never used  Substance and Sexual Activity    Alcohol use: Never   Drug use: Never   Sexual activity: Not Currently    Birth control/protection: Surgical  Other Topics Concern   Not on file  Social History Narrative   Lives with husband, Vinny   Caffeine use: Drinks coffee daily   Right handed   Social Determinants of Health   Financial Resource Strain: Low Risk    Difficulty of Paying Living Expenses: Not hard at all  Food Insecurity: No Food Insecurity   Worried About Charity fundraiser in the Last Year: Never true   [place] in the Last Year: Never true  Transportation Needs: No Transportation Needs   Lack of Transportation (Medical): No   Lack of Transportation (Non-Medical): No  Physical Activity: Insufficiently Active   Days of Exercise per Week: 7 days   Minutes of Exercise per Session: 20 min  Stress: No Stress Concern Present   Feeling of Stress : Not at all  Social Connections: Moderately Integrated   Frequency of Communication with Friends and Family: More than three times a week   Frequency of Social Gatherings with Friends and Family: More than three times a week   Attends Religious Services: Never   Marine scientist or Organizations: Yes   Attends Music therapist: 1 to 4 times per year   Marital Status: Married    Tobacco Counseling Counseling given: Not Answered   Clinical Intake:  Pre-visit preparation completed: Yes  Pain : No/denies pain     BMI - recorded: 27.88 Nutritional Status: BMI 25 -29 Overweight Nutritional Risks: None Diabetes: No  How often do you need to have someone help you when you read instructions, pamphlets, or other written materials from your doctor or pharmacy?: 1 - Never  Diabetic?No  Interpreter Needed?: No  Information entered by :: Charlott Rakes, LPN   Activities of Daily Living In your present state of health, do you have any difficulty performing the following activities: 08/01/2021  Hearing? N  Vision? N  Difficulty  concentrating or making decisions? N  Walking or climbing stairs? N  Dressing or bathing? N  Doing errands, shopping? N  Preparing Food and eating ? N  Using the Toilet? N  In the past six months, have you accidently leaked urine? Y  Comment wears a pad or brief  Do you have problems with loss of bowel control? N  Managing your Medications? N  Managing your Finances? N  Housekeeping or managing your Housekeeping? N  Some recent data might be hidden    Patient Care Team: [place], Randa Evens, PA-C as PCP - General (Physician Assistant)  Indicate any recent Medical Services you may have received from other than [facility] providers in the past year (date may be approximate).     Assessment:   This is a routine wellness examination for [place].  Hearing/Vision screen Hearing Screening - Comments:: Pt  denies any hearing issues  Vision Screening - Comments:: Pt follows up with dr Satira Sark for annual eye exams   Dietary issues and exercise activities discussed: Current Exercise Habits: Structured exercise class, Type of exercise: Other - see comments (PT), Time (Minutes): 25, Frequency (Times/Week): 7, Weekly Exercise (Minutes/Week): 175   Goals Addressed             This Visit's Progress    Patient Stated       Play golf again       Depression Screen PHQ 2/9 Scores 08/01/2021 12/06/2020  PHQ - 2 Score 0 0    Fall Risk Fall Risk  08/01/2021 02/19/2021 12/06/2020 09/07/2018  Falls in the past year? '1 1 1 '$ No  Number falls in past yr: '1 1 1 '$ -  Injury with Fall? 1 0 0 -  Comment bruises and cuts - - -  Risk for fall due to : Impaired mobility;Impaired balance/gait;Impaired vision;History of fall(s) - Impaired balance/gait -  Follow up Falls prevention discussed - Falls evaluation completed -    FALL RISK PREVENTION PERTAINING TO THE HOME:  Any stairs in or around the home? Yes  If so, are there any without handrails? Yes  Home free of loose throw rugs in walkways, pet beds,  electrical cords, etc? Yes  Adequate lighting in your home to reduce risk of falls? Yes   ASSISTIVE DEVICES UTILIZED TO PREVENT FALLS:  Life alert? No  Use of a cane, walker or w/c? Yes  Grab bars in the bathroom? no Shower chair or bench in shower? Yes  Elevated toilet seat or a handicapped toilet? No   TIMED UP AND GO:  Was the test performed? No .   Cognitive Function: MMSE - Mini Mental State Exam 05/09/2020 10/31/2019 09/07/2018 11/20/2017  Orientation to time '5 5 4 4  '$ Orientation to Place '5 5 5 5  '$ Registration '3 3 3 3  '$ Attention/ Calculation 5 5 0 5  Recall '2 2 2 1  '$ Language- name 2 objects '2 2 2 2  '$ Language- repeat '1 1 1 1  '$ Language- follow 3 step command '3 3 3 3  '$ Language- read & follow direction '1 1 1 1  '$ Write a sentence '1 1 1 1  '$ Copy design '1 1 1 1  '$ Total score '29 29 23 27     '$ 6CIT Screen 08/01/2021  What Year? 0 points  What month? 0 points  What time? 0 points  Count back from 20 0 points  Months in reverse 0 points  Repeat phrase 0 points  Total Score 0    Immunizations Immunization History  Administered Date(s) Administered   Influenza, High Dose Seasonal PF 10/07/2014, 10/04/2018, 09/27/2020   Moderna Sars-Covid-2 Vaccination 01/05/2020, 02/02/2020   Pneumococcal Conjugate-13 02/23/2018   Pneumococcal Polysaccharide-23 03/31/2019   Tdap 03/11/2021    TDAP status: Up to date  Flu Vaccine status: Due, Education has been provided regarding the importance of this vaccine. Advised may receive this vaccine at local pharmacy or Health Dept. Aware to provide a copy of the vaccination record if obtained from local pharmacy or Health Dept. Verbalized acceptance and understanding.  Pneumococcal vaccine status: Up to date  Covid-19 vaccine status: Completed vaccines  Qualifies for Shingles Vaccine? Yes   Zostavax completed No   Shingrix Completed?: No.    Education has been provided regarding the importance of this vaccine. Patient has been advised to call  insurance company to determine out of pocket expense if they have  not yet received this vaccine. Advised may also receive vaccine at local pharmacy or Health Dept. Verbalized acceptance and understanding.  Screening Tests Health Maintenance  Topic Date Due   Hepatitis C Screening  Never done   Zoster Vaccines- Shingrix (1 of 2) Never done   DEXA SCAN  Never done   COVID-19 Vaccine (3 - Booster for Moderna series) 07/01/2020   INFLUENZA VACCINE  07/22/2021   TETANUS/TDAP  03/12/2031   PNA vac Low Risk Adult  Completed   HPV VACCINES  Aged Out    Health Maintenance  Health Maintenance Due  Topic Date Due   Hepatitis C Screening  Never done   Zoster Vaccines- Shingrix (1 of 2) Never done   DEXA SCAN  Never done   COVID-19 Vaccine (3 - Booster for Moderna series) 07/01/2020   INFLUENZA VACCINE  07/22/2021    Colorectal cancer screening: Type of screening: Colonoscopy. Completed 02/27/21. Repeat every per pathology result/MD years  Mammogram status: Completed 04/08/21. Repeat every year  Bone density completed 08/24/14    Additional Screening:  Hepatitis C Screening: does qualify;   Vision Screening: Recommended annual ophthalmology exams for early detection of glaucoma and other disorders of the eye. Is the patient up to date with their annual eye exam?  Yes  Who is the provider or what is the name of the office in which the patient attends annual eye exams? Dr Satira Sark If pt is not established with a provider, would they like to be referred to a provider to establish care? No .   Dental Screening: Recommended annual dental exams for proper oral hygiene  Community Resource Referral / Chronic Care Management: CRR required this visit?  No   CCM required this visit?  No      Plan:     I have personally reviewed and noted the following in the patient's chart:   Medical and social history Use of alcohol, tobacco or illicit drugs  Current medications and supplements  including opioid prescriptions. Patient is not currently taking opioid prescriptions. Functional ability and status Nutritional status Physical activity Advanced directives List of other physicians Hospitalizations, surgeries, and ER visits in previous 12 months Vitals Screenings to include cognitive, depression, and falls Referrals and appointments  In addition, I have reviewed and discussed with patient certain preventive protocols, quality metrics, and best practice recommendations. A written personalized care plan for preventive services as well as general preventive health recommendations were provided to patient.     Willette Brace, LPN   [id number]   Nurse Notes: None

## 2021-08-01 NOTE — Patient Instructions (Addendum)
[name] , Thank you for taking time to come for your Medicare Wellness Visit. I appreciate your ongoing commitment to your health goals. Please review the following plan we discussed and let me know if I can assist you in the future.   Screening recommendations/referrals: Colonoscopy: Completed 02/27/21 repeat per pathology result Mammogram: 04/08/21 repeat every year  Bone Density: Done 08/24/14 repeat per MD Recommended yearly ophthalmology/optometry visit for glaucoma screening and checkup Recommended yearly dental visit for hygiene and checkup  Vaccinations: Influenza vaccine: Due after 07/22/21 Pneumococcal vaccine: Completed  Tdap vaccine: Done 03/11/21 repeat every 10 years 03/12/31 Shingles vaccine: Shingrix discussed. Please contact your pharmacy for coverage information.    Covid-19:Completed 1/14 & 02/02/20  Advanced directives: Please bring a copy of your health care power of attorney and living will to the office at your convenience.  Conditions/risks identified: Get back to  playing golf   Next appointment: Follow up in one year for your annual wellness visit    Preventive Care 65 Years and Older, Female Preventive care refers to lifestyle choices and visits with your health care provider that can promote health and wellness. What does preventive care include? A yearly physical exam. This is also called an annual well check. Dental exams once or twice a year. Routine eye exams. Ask your health care provider how often you should have your eyes checked. Personal lifestyle choices, including: Daily care of your teeth and gums. Regular physical activity. Eating a healthy diet. Avoiding tobacco and drug use. Limiting alcohol use. Practicing safe sex. Taking low-dose aspirin every day. Taking vitamin and mineral supplements as recommended by your health care provider. What happens during an annual well check? The services and screenings done by your health care provider  during your annual well check will depend on your age, overall health, lifestyle risk factors, and family history of disease. Counseling  Your health care provider may ask you questions about your: Alcohol use. Tobacco use. Drug use. Emotional well-being. Home and relationship well-being. Sexual activity. Eating habits. History of falls. Memory and ability to understand (cognition). Work and work Statistician. Reproductive health. Screening  You may have the following tests or measurements: Height, weight, and BMI. Blood pressure. Lipid and cholesterol levels. These may be checked every 5 years, or more frequently if you are over 10 years old. Skin check. Lung cancer screening. You may have this screening every year starting at age 52 if you have a 30-pack-year history of smoking and currently smoke or have quit within the past 15 years. Fecal occult blood test (FOBT) of the stool. You may have this test every year starting at age 51. Flexible sigmoidoscopy or colonoscopy. You may have a sigmoidoscopy every 5 years or a colonoscopy every 10 years starting at age 23. Hepatitis C blood test. Hepatitis B blood test. Sexually transmitted disease (STD) testing. Diabetes screening. This is done by checking your blood sugar (glucose) after you have not eaten for a while (fasting). You may have this done every 1-3 years. Bone density scan. This is done to screen for osteoporosis. You may have this done starting at age 106. Mammogram. This may be done every 1-2 years. Talk to your health care provider about how often you should have regular mammograms. Talk with your health care provider about your test results, treatment options, and if necessary, the need for more tests. Vaccines  Your health care provider may recommend certain vaccines, such as: Influenza vaccine. This is recommended every year. Tetanus, diphtheria,  and acellular pertussis (Tdap, Td) vaccine. You may need a Td booster every  10 years. Zoster vaccine. You may need this after age 76. Pneumococcal 13-valent conjugate (PCV13) vaccine. One dose is recommended after age 76. Pneumococcal polysaccharide (PPSV23) vaccine. One dose is recommended after age 100. Talk to your health care provider about which screenings and vaccines you need and how often you need them. This information is not intended to replace advice given to you by your health care provider. Make sure you discuss any questions you have with your health care provider. Document Released: 01/04/2016 Document Revised: 08/27/2016 Document Reviewed: 10/09/2015 Elsevier Interactive Patient Education  2017 [place] Prevention in the Home Falls can cause injuries. They can happen to people of all ages. There are many things you can do to make your home safe and to help prevent falls. What can I do on the outside of my home? Regularly fix the edges of walkways and driveways and fix any cracks. Remove anything that might make you trip as you walk through a door, such as a raised step or threshold. Trim any bushes or trees on the path to your home. Use bright outdoor lighting. Clear any walking paths of anything that might make someone trip, such as rocks or tools. Regularly check to see if handrails are loose or broken. Make sure that both sides of any steps have handrails. Any raised decks and porches should have guardrails on the edges. Have any leaves, snow, or ice cleared regularly. Use sand or salt on walking paths during winter. Clean up any spills in your garage right away. This includes oil or grease spills. What can I do in the bathroom? Use night lights. Install grab bars by the toilet and in the tub and shower. Do not use towel bars as grab bars. Use non-skid mats or decals in the tub or shower. If you need to sit down in the shower, use a plastic, non-slip stool. Keep the floor dry. Clean up any water that spills on the floor as soon as it  happens. Remove soap buildup in the tub or shower regularly. Attach bath mats securely with double-sided non-slip rug tape. Do not have throw rugs and other things on the floor that can make you trip. What can I do in the bedroom? Use night lights. Make sure that you have a light by your bed that is easy to reach. Do not use any sheets or blankets that are too big for your bed. They should not hang down onto the floor. Have a firm chair that has side arms. You can use this for support while you get dressed. Do not have throw rugs and other things on the floor that can make you trip. What can I do in the kitchen? Clean up any spills right away. Avoid walking on wet floors. Keep items that you use a lot in easy-to-reach places. If you need to reach something above you, use a strong step stool that has a grab bar. Keep electrical cords out of the way. Do not use floor polish or wax that makes floors slippery. If you must use wax, use non-skid floor wax. Do not have throw rugs and other things on the floor that can make you trip. What can I do with my stairs? Do not leave any items on the stairs. Make sure that there are handrails on both sides of the stairs and use them. Fix handrails that are broken or loose. Make sure  that handrails are as long as the stairways. Check any carpeting to make sure that it is firmly attached to the stairs. Fix any carpet that is loose or worn. Avoid having throw rugs at the top or bottom of the stairs. If you do have throw rugs, attach them to the floor with carpet tape. Make sure that you have a light switch at the top of the stairs and the bottom of the stairs. If you do not have them, ask someone to add them for you. What else can I do to help prevent falls? Wear shoes that: Do not have high heels. Have rubber bottoms. Are comfortable and fit you well. Are closed at the toe. Do not wear sandals. If you use a stepladder: Make sure that it is fully opened.  Do not climb a closed stepladder. Make sure that both sides of the stepladder are locked into place. Ask someone to hold it for you, if possible. Clearly mark and make sure that you can see: Any grab bars or handrails. First and last steps. Where the edge of each step is. Use tools that help you move around (mobility aids) if they are needed. These include: Canes. Walkers. Scooters. Crutches. Turn on the lights when you go into a dark area. Replace any light bulbs as soon as they burn out. Set up your furniture so you have a clear path. Avoid moving your furniture around. If any of your floors are uneven, fix them. If there are any pets around you, be aware of where they are. Review your medicines with your doctor. Some medicines can make you feel dizzy. This can increase your chance of falling. Ask your doctor what other things that you can do to help prevent falls. This information is not intended to replace advice given to you by your health care provider. Make sure you discuss any questions you have with your health care provider. Document Released: 10/04/2009 Document Revised: 05/15/2016 Document Reviewed: 01/12/2015 Elsevier Interactive Patient Education  2017 [employer].

## 2021-08-02 ENCOUNTER — Telehealth: Payer: Self-pay

## 2021-08-02 NOTE — Telephone Encounter (Signed)
Patient's husband called in wanting [place] to send over a copy of recent imaging to [facility] so they have a copy, wondering if we can send them records without them coming in. Patient is going to call back with information of where to send it.

## 2021-08-02 NOTE — Telephone Encounter (Signed)
Noted,correct they need to contact [place]

## 2021-08-02 NOTE — Telephone Encounter (Signed)
Patient's daughter is requesting CD's, advised that they need to contact [facility] where the MRI's were done.

## 2021-08-05 ENCOUNTER — Encounter: Payer: Medicare HMO | Admitting: Physical Therapy

## 2021-08-06 ENCOUNTER — Encounter: Payer: Medicare HMO | Admitting: Physical Therapy

## 2021-08-07 DIAGNOSIS — R29898 Other symptoms and signs involving the musculoskeletal system: Secondary | ICD-10-CM | POA: Diagnosis not present

## 2021-08-07 DIAGNOSIS — R413 Other amnesia: Secondary | ICD-10-CM | POA: Diagnosis not present

## 2021-08-07 DIAGNOSIS — R2689 Other abnormalities of gait and mobility: Secondary | ICD-10-CM | POA: Diagnosis not present

## 2021-08-08 ENCOUNTER — Other Ambulatory Visit: Payer: Self-pay

## 2021-08-08 ENCOUNTER — Ambulatory Visit: Payer: Medicare HMO | Admitting: Physical Therapy

## 2021-08-08 ENCOUNTER — Encounter: Payer: Self-pay | Admitting: Physical Therapy

## 2021-08-08 DIAGNOSIS — R2681 Unsteadiness on feet: Secondary | ICD-10-CM | POA: Diagnosis not present

## 2021-08-08 DIAGNOSIS — R2689 Other abnormalities of gait and mobility: Secondary | ICD-10-CM | POA: Diagnosis not present

## 2021-08-08 NOTE — Therapy (Signed)
[facility] [address] [place], [place], [address] Phone: [phone]   Fax:  [phone]  Physical Therapy Treatment  Patient Details  Name: [name] MRN: [id number] Date of Birth: 30-Oct-1941 Referring Provider (PT): Alyssa Allwardt   Encounter Date: 08/08/2021   PT End of Session - 08/08/21 1346     Visit Number 4    Number of Visits 12    Date for PT Re-Evaluation 08/29/21    Authorization Type [employer] MEd[employer]    PT Start Time 1346    PT Stop Time 1429    PT Time Calculation (min) 43 min    Activity Tolerance Patient tolerated treatment well    Behavior During Therapy [facility] for tasks assessed/performed             Past Medical History:  Diagnosis Date   Allergy    Arthritis    Cancer ([place])    kidney, seeing dr in [place] disease    Reflux     Past Surgical History:  Procedure Laterality Date   ABDOMINAL HYSTERECTOMY     COLONOSCOPY W/ BIOPSIES  02/27/2021   Repeat in 5 years   [place]  2018   TRIGGER FINGER RELEASE  2018    There were no vitals filed for this visit.                      [facility] Adult PT Treatment/Exercise - 08/08/21 0001       Knee/Hip Exercises: Standing   Hip Flexion 20 reps    Gait Training walking with SPC 4 x86f; worked on 1/4 turns to the left as in kitchen and pivot turns walking    Other Standing Knee Exercises on [employer] at counter: L/R weight shifts x 20 ea bil;  torso turns x 10 ;  reaching outside of BOS at  x 10 bil;    Other Standing Knee Exercises Rows, ext RTB x 20/10      Knee/Hip Exercises: Seated   Other Seated Knee/Hip Exercises horizontal ABD x 10 RTB; trunk extension BTB x 20    Sit to [place] 15 reps   with cues for scapular retraction upon standing                   PT Education - 08/08/21 1428     Education Details HEP progressed    Person(s) Educated Patient    Methods  Explanation;Demonstration;Handout    Comprehension Verbalized understanding;Returned demonstration              PT Short Term Goals - 07/21/21 2058       PT SHORT TERM GOAL #1   Title Pt to be independent with initial HEP    Time 2    Period Weeks    Status New    Target Date 08/01/21      PT SHORT TERM GOAL #2   Title Pt to demo ability to rise from regular height chair, on first attempt consistently, with minimal or no UE support    Time 2    Period Weeks    Status New    Target Date 08/01/21               PT Long Term Goals - 07/21/21 2101       PT LONG TERM GOAL #1   Title Pt to be independent with final HEP  Time 6    Period Weeks    Status New    Target Date 08/29/21      PT LONG TERM GOAL #2   Title Pt to demo improved score on 5 time sit to stand by at least 5 seconds.    Time 6    Period Weeks    Status New    Target Date 08/29/21      PT LONG TERM GOAL #3   Title Pt to demo improved score on TUG by at least 9 points, to improve gait speed and efficiency.    Time 6    Period Weeks    Status New    Target Date 08/29/21      PT LONG TERM GOAL #4   Title Pt to demo improved score on BERG by at least 5 points    Time 6    Period Weeks    Status New    Target Date 08/29/21                   Plan - 08/08/21 1429     Clinical Impression Statement Patient presents today carrying SPC but reports she doesn't normally "use" it, just carries in case she loses her balance. She demonstrates forward trunk lean with independent gait which was corrected using the [place]. PT advised pt use cane at all times and if she is going to walk outside, use a walker. Her MD told her not to walk for exercise but she is doing it anyway. She reports that she has an MD appt at [facility] 08/15/21 and that she may have PD. She does demonstrate some festination with gait and a forward head and increased kyphosis. We worked on upper back strengthening some today to address  this. We also worked on quarter turns like she does in the kitchen. She will benefit from PT to further address balance issues, posture and strength.    PT Frequency 2x / week    PT Duration 6 weeks    PT Treatment/Interventions ADLs/Self Care Home Management;Cryotherapy;Electrical Stimulation;DME Instruction;Ultrasound;Traction;Moist Heat;Iontophoresis '4mg'$ /ml Dexamethasone;Gait training;Stair training;Functional mobility training;Therapeutic activities;Therapeutic exercise;Balance training;Neuromuscular re-education;Patient/family education;Manual techniques;Passive range of motion;Dry needling;Energy conservation;Taping;Spinal Manipulations;Joint Manipulations    PT Home Exercise Plan [email or web address]    Consulted and Agree with Plan of Care Patient             Patient will benefit from skilled therapeutic intervention in order to improve the following deficits and impairments:  Abnormal gait, Decreased coordination, Decreased mobility, Decreased strength, Decreased activity tolerance, Decreased range of motion, Decreased balance, Decreased knowledge of precautions, Decreased safety awareness, Difficulty walking  Visit Diagnosis: Gait instability  Other abnormalities of gait and mobility     Problem List Patient Active Problem List   Diagnosis Date Noted   Gait disturbance 05/09/2020   Pain in both lower extremities 09/19/2019   Essential hypertension 05/02/2019   Exertional dyspnea 05/02/2019   Memory loss 11/20/2017   Muscle weakness 07/17/2017   Madelyn Flavors PT 08/08/2021, 3:56 PM  [place], [place], [address] Phone: [phone]   Fax:  [phone]  Name: JOELE ZENO MRN: [id number] Date of Birth: [birth year]

## 2021-08-08 NOTE — Patient Instructions (Signed)
Access Code: [email or web address] URL: [email or web address] Date: 08/08/2021 Prepared by: Almyra Free  Exercises Standing Shoulder Horizontal Abduction with Resistance - 1 x daily - 7 x weekly - 1-3 sets - 10 reps Standing Shoulder Row with Anchored Resistance - 1 x daily - 7 x weekly - 1-3 sets - 10 reps Shoulder extension with resistance - Neutral - 1 x daily - 7 x weekly - 1-3 sets - 10 reps

## 2021-08-12 ENCOUNTER — Ambulatory Visit: Payer: Medicare HMO | Admitting: Physical Therapy

## 2021-08-12 ENCOUNTER — Other Ambulatory Visit: Payer: Self-pay

## 2021-08-12 DIAGNOSIS — R2689 Other abnormalities of gait and mobility: Secondary | ICD-10-CM

## 2021-08-13 ENCOUNTER — Encounter: Payer: Self-pay | Admitting: Physical Therapy

## 2021-08-13 DIAGNOSIS — G20 Parkinson's disease: Secondary | ICD-10-CM | POA: Diagnosis not present

## 2021-08-13 DIAGNOSIS — R269 Unspecified abnormalities of gait and mobility: Secondary | ICD-10-CM | POA: Diagnosis not present

## 2021-08-13 DIAGNOSIS — R413 Other amnesia: Secondary | ICD-10-CM | POA: Diagnosis not present

## 2021-08-13 DIAGNOSIS — G250 Essential tremor: Secondary | ICD-10-CM | POA: Diagnosis not present

## 2021-08-13 NOTE — Therapy (Signed)
[facility] [address] [place], [place], [address] Phone: [phone]   Fax:  [phone]  Physical Therapy Treatment  Patient Details  Name: [name] MRN: [id number] Date of Birth: 02-Jun-1941 Referring Provider (PT): Alyssa Allwardt   Encounter Date: 08/12/2021   PT End of Session - 08/13/21 1133     Visit Number 5    Number of Visits 12    Date for PT Re-Evaluation 08/29/21    Authorization Type [employer] MEdic[employer]    PT Start Time 1346    PT Stop Time 1430    PT Time Calculation (min) 44 min    Equipment Utilized During Treatment Gait belt    Activity Tolerance Patient tolerated treatment well    Behavior During Therapy [facility] for tasks assessed/performed             Past Medical History:  Diagnosis Date   Allergy    Arthritis    Cancer ([place])    kidney, seeing dr in [place] disease    Reflux     Past Surgical History:  Procedure Laterality Date   ABDOMINAL HYSTERECTOMY     COLONOSCOPY W/ BIOPSIES  02/27/2021   Repeat in 5 years   [place]  2018   TRIGGER FINGER RELEASE  2018    There were no vitals filed for this visit.   Subjective Assessment - 08/13/21 1131     Subjective Pt states she has been walking. She is concerned about her condition.    Currently in Pain? No/denies                               [facility] Adult PT Treatment/Exercise - 08/13/21 0001       Ambulation/Gait   Gait Comments 35 ft x 10 with SPC, cuing for increased step height, moderate speed, and correct use of SPC.      Knee/Hip Exercises: Standing   Hip Flexion 20 reps    Stairs up/down 5 steps, 1 HR and SPC x 6.    Other Standing Knee Exercises bwd walking 20 ft x 4 (CGA),      Knee/Hip Exercises: Seated   Other Seated Knee/Hip Exercises Rows, Horiz abd, shoulder ER, RTB x 15 each.    Sit to [facility] 15 reps   with cues for scapular retraction upon  standing                     PT Short Term Goals - 07/21/21 2058       PT SHORT TERM GOAL #1   Title Pt to be independent with initial HEP    Time 2    Period Weeks    Status New    Target Date 08/01/21      PT SHORT TERM GOAL #2   Title Pt to demo ability to rise from regular height chair, on first attempt consistently, with minimal or no UE support    Time 2    Period Weeks    Status New    Target Date 08/01/21               PT Long Term Goals - 07/21/21 2101       PT LONG TERM GOAL #1   Title Pt to be independent with final HEP    Time 6    Period Weeks  Status New    Target Date 08/29/21      PT LONG TERM GOAL #2   Title Pt to demo improved score on 5 time sit to stand by at least 5 seconds.    Time 6    Period Weeks    Status New    Target Date 08/29/21      PT LONG TERM GOAL #3   Title Pt to demo improved score on TUG by at least 9 points, to improve gait speed and efficiency.    Time 6    Period Weeks    Status New    Target Date 08/29/21      PT LONG TERM GOAL #4   Title Pt to demo improved score on BERG by at least 5 points    Time 6    Period Weeks    Status New    Target Date 08/29/21                   Plan - 08/13/21 1138     Clinical Impression Statement Gait training done for improving step height, as well as correct use of SPC. Pt did well with sequencing, and does have improved safety and efficiency when focused on gait mechanics. She is doing much better wtih transfer efficiency, still not standing on 1st attempt 100% of the time. She will benefit from continued strengthening, balance, and gait training. Pt [facility] to [facility] and play [facility], but have discussed that she is not safe to do these at this time.    PT Frequency 2x / week    PT Duration 6 weeks    PT Treatment/Interventions ADLs/Self Care Home Management;Cryotherapy;Electrical Stimulation;DME Instruction;Ultrasound;Traction;Moist  Heat;Iontophoresis '4mg'$ /ml Dexamethasone;Gait training;Stair training;Functional mobility training;Therapeutic activities;Therapeutic exercise;Balance training;Neuromuscular re-education;Patient/family education;Manual techniques;Passive range of motion;Dry needling;Energy conservation;Taping;Spinal Manipulations;Joint Manipulations    PT Home Exercise Plan [email or web address]    Consulted and Agree with Plan of Care Patient             Patient will benefit from skilled therapeutic intervention in order to improve the following deficits and impairments:  Abnormal gait, Decreased coordination, Decreased mobility, Decreased strength, Decreased activity tolerance, Decreased range of motion, Decreased balance, Decreased knowledge of precautions, Decreased safety awareness, Difficulty walking  Visit Diagnosis: Other abnormalities of gait and mobility     Problem List Patient Active Problem List   Diagnosis Date Noted   Gait disturbance 05/09/2020   Pain in both lower extremities 09/19/2019   Essential hypertension 05/02/2019   Exertional dyspnea 05/02/2019   Memory loss 11/20/2017   Muscle weakness 07/17/2017    Lyndee Hensen, PT, DPT 11:46 AM  08/13/21    [facility] [place], [place], [address] Phone: [phone]   Fax:  [phone]  Name: [name] MRN: [id number] Date of Birth: Aug 16, 1941

## 2021-08-14 ENCOUNTER — Encounter: Payer: Medicare HMO | Admitting: Physical Therapy

## 2021-08-15 DIAGNOSIS — R4189 Other symptoms and signs involving cognitive functions and awareness: Secondary | ICD-10-CM | POA: Diagnosis not present

## 2021-08-15 DIAGNOSIS — R1310 Dysphagia, unspecified: Secondary | ICD-10-CM | POA: Diagnosis not present

## 2021-08-19 ENCOUNTER — Encounter: Payer: Self-pay | Admitting: Physician Assistant

## 2021-08-19 ENCOUNTER — Ambulatory Visit: Payer: Medicare HMO | Admitting: Physician Assistant

## 2021-08-19 ENCOUNTER — Ambulatory Visit (INDEPENDENT_AMBULATORY_CARE_PROVIDER_SITE_OTHER): Payer: Medicare HMO | Admitting: Physician Assistant

## 2021-08-19 ENCOUNTER — Other Ambulatory Visit: Payer: Self-pay

## 2021-08-19 VITALS — BP 116/69 | HR 72 | Temp 98.2°F | Ht 61.5 in | Wt 145.6 lb

## 2021-08-19 DIAGNOSIS — R413 Other amnesia: Secondary | ICD-10-CM

## 2021-08-19 DIAGNOSIS — R1310 Dysphagia, unspecified: Secondary | ICD-10-CM | POA: Diagnosis not present

## 2021-08-19 DIAGNOSIS — R634 Abnormal weight loss: Secondary | ICD-10-CM

## 2021-08-19 DIAGNOSIS — R2681 Unsteadiness on feet: Secondary | ICD-10-CM | POA: Diagnosis not present

## 2021-08-19 MED ORDER — LEVOTHYROXINE SODIUM 75 MCG PO TABS: 75.0000 ug | ORAL_TABLET | Freq: Every day | ORAL | 1 refills | Status: DC

## 2021-08-19 NOTE — Progress Notes (Signed)
Established Patient Office Visit  Subjective:  Patient ID: [name], female    DOB: 07-Oct-1941  Age: 80 y.o. MRN: [id number]  CC:  Chief Complaint  Patient presents with   Memory Loss    Neurology Follow up   Dysphagia    HPI LUS JEANES presents for f/up on memory changes and new dysphagia s/p appointment with [facility]. Her husband is with her. Son and daughter-in-law are not present today, but they say they have been actively helping in her medical care as well and did go to [facility] appointment too.   Assessment from [place] neurology on 08/15/2021 appointment with Dr. Lavena Bullion included most likely she has dementia syndrome such as Alzheimer's, though she has the slowness of movement consistent with a mild degree of parkinsonism.  She did not feel like a dopaminergic medication would be helpful for her.  It looks like the concern of Alzheimer's disease was shared with the family.  Plan from [facility] included:  "PLAN: - referral to Cognitive Neurology for further assessment and recommendations related to memory impairment - discussed safety concerns regarding driving, and strongly recommended NOT to drive - referral to Speech Therapy for hypophonia, and for swallow study given dysphagia - continue PT - provided handout for management of constipation - her family will ensure the DAT scan is made available, by mailing in CD  - we will hold off on any medication for parkinsonism for now"  Patient has still been going to physical therapy and feels frustrated by lack of progress.  She says it feels like her legs are still not getting much better.  She also says that her taste for food has changed.  She says that sometimes it feels like it is difficult to swallow, but she is not actively choking and denies any food getting stuck.  She does have noticeable weight loss this year.  Wt Readings from Last 3 Encounters:  08/19/21 145 lb 9.6 oz (66 kg)  03/14/21 150 lb (68 kg)  03/11/21 152  lb 6.1 oz (69.1 kg)    Past Medical History:  Diagnosis Date   Allergy    Arthritis    Cancer ([place])    kidney, seeing dr in [place] disease    Reflux     Past Surgical History:  Procedure Laterality Date   ABDOMINAL HYSTERECTOMY     COLONOSCOPY W/ BIOPSIES  02/27/2021   Repeat in 5 years   SALIVARY GLAND SURGERY     TRIGGER FINGER RELEASE  2018   TRIGGER FINGER RELEASE  2018    Family History  Problem Relation Age of Onset   Kidney disease Mother    Cancer - Lung Father    Mental illness Maternal Grandmother     Social History   Socioeconomic History   Marital status: Married    Spouse name: Lyndle Herrlich   Number of children: 2   Years of education: Dental assitant   Highest education level: Not on file  Occupational History   Not on file  Tobacco Use   Smoking status: Former    Packs/day: 0.25    Years: 20.00    Pack years: 5.00    Types: Cigarettes    Quit date: 05/01/2004    Years since quitting: 17.3   Smokeless tobacco: Never  Vaping Use   Vaping Use: Never used  Substance and Sexual Activity   Alcohol use: Never   Drug use: Never   Sexual activity: Not Currently  Birth control/protection: Surgical  Other Topics Concern   Not on file  Social History Narrative   Lives with husband, Vinny   Caffeine use: Drinks coffee daily   Right handed   Social Determinants of Health   Financial Resource Strain: Low Risk    Difficulty of Paying Living Expenses: Not hard at all  Food Insecurity: No Food Insecurity   Worried About Charity fundraiser in the Last Year: Never true   Ran Out of Food in the Last Year: Never true  Transportation Needs: No Transportation Needs   Lack of Transportation (Medical): No   Lack of Transportation (Non-Medical): No  Physical Activity: Insufficiently Active   Days of Exercise per Week: 7 days   Minutes of Exercise per Session: 20 min  Stress: No Stress Concern Present   Feeling of Stress : Not at all  Social  Connections: Moderately Integrated   Frequency of Communication with Friends and Family: More than three times a week   Frequency of Social Gatherings with Friends and Family: More than three times a week   Attends Religious Services: Never   Marine scientist or Organizations: Yes   Attends Music therapist: 1 to 4 times per year   Marital Status: Married  Human resources officer Violence: Not At Risk   Fear of Current or Ex-Partner: No   Emotionally Abused: No   Physically Abused: No   Sexually Abused: No    Outpatient Medications Prior to Visit  Medication Sig Dispense Refill   Cholecalciferol (VITAMIN D3 PO) Take by mouth.     levothyroxine (SYNTHROID) 75 MCG tablet TAKE 1 TABLET BY MOUTH DAILY BEFORE BREAKFAST 90 tablet 0   lisinopril (ZESTRIL) 10 MG tablet Take 1 tablet by mouth once daily 90 tablet 0   Multiple Vitamin (MULTIVITAMIN WITH MINERALS) TABS tablet Take 1 tablet by mouth daily.     Calcium 150 MG TABS 1 tab     [place] (VENASTAT PO) Take by mouth. (Patient not taking: Reported on 08/19/2021)     Magnesium 100 MG CAPS Take by mouth. (Patient not taking: Reported on 08/19/2021)     pantoprazole (PROTONIX) 40 MG tablet 1 tablet (Patient not taking: No sig reported)     Potassium Citrate 99 MG CAPS Take 1 capsule by mouth daily.     No facility-administered medications prior to visit.    Allergies  Allergen Reactions   Bactrim Other (See Comments)    Unsure childhood reaction.   Erythromycin Hives    Broke out in hives   Sulfa Antibiotics Other (See Comments)   Sulfamethoxazole-Trimethoprim Itching and Other (See Comments)    Unsure childhood reaction. Unsure childhood reaction.   Amoxicillin Rash   Nitrofurantoin Monohyd Macro Rash   Other Rash   Vicodin [Hydrocodone-Acetaminophen] Rash    ROS Review of Systems REFER TO HPI FOR PERTINENT POSITIVES AND NEGATIVES    Objective:    Physical Exam Vitals and nursing note reviewed.   Constitutional:      Appearance: Normal appearance. She is normal weight. She is not toxic-appearing.     Comments: Used a cane to get back to exam room today.  HENT:     Head: Normocephalic and atraumatic.     Right Ear: Ear canal and external ear normal.     Left Ear: Ear canal and external ear normal.     Nose: Nose normal.     Mouth/Throat:     Mouth: Mucous membranes are moist.  Eyes:     Extraocular Movements: Extraocular movements intact.     Conjunctiva/sclera: Conjunctivae normal.     Pupils: Pupils are equal, round, and reactive to light.  Cardiovascular:     Rate and Rhythm: Normal rate and regular rhythm.     Pulses: Normal pulses.     Heart sounds: Normal heart sounds.  Pulmonary:     Effort: Pulmonary effort is normal.     Breath sounds: Normal breath sounds.  Musculoskeletal:        General: Normal range of motion.     Cervical back: Normal range of motion and neck supple.  Skin:    General: Skin is warm and dry.  Neurological:     General: No focal deficit present.     Mental Status: She is alert and oriented to person, place, and time.     Cranial Nerves: No cranial nerve deficit.     Sensory: No sensory deficit.     Motor: Weakness (generalized; but overall strength in extremities is equal in all and able to push /pull against resistance) present.     Gait: Gait abnormal (shuffling, unsteady).    BP 116/69   Pulse 72   Temp 98.2 F (36.8 C) (Temporal)   Ht 5' 1.5" (1.562 m)   Wt 145 lb 9.6 oz (66 kg)   SpO2 95%   BMI 27.07 kg/m  Wt Readings from Last 3 Encounters:  08/19/21 145 lb 9.6 oz (66 kg)  03/14/21 150 lb (68 kg)  03/11/21 152 lb 6.1 oz (69.1 kg)     Health Maintenance Due  Topic Date Due   Hepatitis C Screening  Never done   Zoster Vaccines- Shingrix (1 of 2) Never done   DEXA SCAN  Never done   COVID-19 Vaccine (3 - Booster for Moderna series) 07/01/2020   INFLUENZA VACCINE  07/22/2021    There are no preventive care reminders  to display for this patient.  Lab Results  Component Value Date   TSH 0.51 06/13/2021   Lab Results  Component Value Date   WBC 5.9 03/11/2021   HGB 13.3 03/11/2021   HCT 39.3 03/11/2021   MCV 97.7 03/11/2021   PLT 274.0 03/11/2021   Lab Results  Component Value Date   NA 143 03/11/2021   K 4.3 03/11/2021   CO2 28 03/11/2021   GLUCOSE 108 (H) 03/11/2021   BUN 17 03/11/2021   CREATININE 0.79 03/11/2021   BILITOT 0.6 03/11/2021   ALKPHOS 70 03/11/2021   AST 16 03/11/2021   ALT 13 03/11/2021   PROT 6.7 03/11/2021   ALBUMIN 4.3 03/11/2021   CALCIUM 9.9 03/11/2021   GFR 72.18 03/11/2021   Lab Results  Component Value Date   CHOL 189 06/13/2021   Lab Results  Component Value Date   HDL 60.90 06/13/2021   Lab Results  Component Value Date   LDLCALC 51 03/11/2021   Lab Results  Component Value Date   TRIG 216.0 (H) 06/13/2021   Lab Results  Component Value Date   CHOLHDL 3 06/13/2021   No results found for: HGBA1C    Assessment & Plan:   Problem List Items Addressed This Visit   None  -I personally reviewed the report, plan from her [place] appointment with her and her husband. -She is going to continue physical therapy at this time -She does have appointments set up for further assessment of her memory loss and dysphagia. -Encouraged them to monitor her her weight and may need  to consider adding Boost or Ensure shakes daily. -I reiterated today that she should not be driving. -F/up with me in approx 4 months   Follow-up: No follow-ups on file.   Total time spent with patient and husband in exam room reviewing history and physical, documents from [facility] neurology, physical therapy and plan moving forward as well as documentation was 40 minutes.  Takayla Baillie M Lenvil Swaim, PA-C

## 2021-08-19 NOTE — Patient Instructions (Signed)
Good to see you! Please keep specialist f/up appointments. Continue PT. Monitor weight and symptoms. Call if any concerns.

## 2021-08-27 ENCOUNTER — Encounter: Payer: Self-pay | Admitting: Physical Therapy

## 2021-08-27 ENCOUNTER — Ambulatory Visit: Payer: Medicare HMO | Admitting: Physical Therapy

## 2021-08-27 DIAGNOSIS — R2689 Other abnormalities of gait and mobility: Secondary | ICD-10-CM | POA: Diagnosis not present

## 2021-08-27 NOTE — Therapy (Signed)
[facility] [address] [place], [place], [address] Phone: [phone]   Fax:  [phone]  Physical Therapy Treatment  Patient Details  Name: [name] MRN: [id number] Date of Birth: 06-01-1941 Referring Provider (PT): Alyssa Allwardt   Encounter Date: 08/27/2021   PT End of Session - 08/27/21 1225     Visit Number 6    Number of Visits 12    Date for PT Re-Evaluation 08/29/21    Authorization Type [employer]    PT Start Time 1218    PT Stop Time 1300    PT Time Calculation (min) 42 min    Equipment Utilized During Treatment Gait belt    Activity Tolerance Patient tolerated treatment well    Behavior During Therapy [facility] for tasks assessed/performed             Past Medical History:  Diagnosis Date   Allergy    Arthritis    Cancer ([place])    kidney, seeing dr in [place] disease    Reflux     Past Surgical History:  Procedure Laterality Date   ABDOMINAL HYSTERECTOMY     COLONOSCOPY W/ BIOPSIES  02/27/2021   Repeat in 5 years   [place]  2018   TRIGGER FINGER RELEASE  2018    There were no vitals filed for this visit.   Subjective Assessment - 08/27/21 1224     Subjective Pt states "this isnt helping me" She states she is having a hard time getting up out of a chair, and trouble with her balance.    Currently in Pain? No/denies                               [facility] Adult PT Treatment/Exercise - 08/27/21 0001       Ambulation/Gait   Gait Comments 35 ft x 10 with SPC, cuing for increased step height, moderate speed, and correct use of SPC. also practice with direction changes x 6, with cuing for taking less, larger steps vs several small shuffling steps.      Knee/Hip Exercises: Aerobic   Recumbent Bike L1 , 1 mile (~ 7 min)      Knee/Hip Exercises: Standing   Hip Flexion 20 reps    Stairs --    Other Standing Knee Exercises  lateral stepping w weight shifts x 20;    Other Standing Knee Exercises bwd walking 20 ft x 4 (CGA), Side stepping 10 ft x 2;  fwd/bwd/L/R stepping with verbal cues. x 2 min;      Knee/Hip Exercises: Seated   Other Seated Knee/Hip Exercises practice for fwd flexion/bending to reach feet/toes, to don socks.    Other Seated Knee/Hip Exercises Rows, Horiz abd, shoulder ER, RTB x 15 each.    Sit to Sand 15 reps   use of light UE support                     PT Short Term Goals - 07/21/21 2058       PT SHORT TERM GOAL #1   Title Pt to be independent with initial HEP    Time 2    Period Weeks    Status New    Target Date 08/01/21      PT SHORT TERM GOAL #2   Title Pt to demo ability to rise from  regular height chair, on first attempt consistently, with minimal or no UE support    Time 2    Period Weeks    Status New    Target Date 08/01/21               PT Long Term Goals - 07/21/21 2101       PT LONG TERM GOAL #1   Title Pt to be independent with final HEP    Time 6    Period Weeks    Status New    Target Date 08/29/21      PT LONG TERM GOAL #2   Title Pt to demo improved score on 5 time sit to stand by at least 5 seconds.    Time 6    Period Weeks    Status New    Target Date 08/29/21      PT LONG TERM GOAL #3   Title Pt to demo improved score on TUG by at least 9 points, to improve gait speed and efficiency.    Time 6    Period Weeks    Status New    Target Date 08/29/21      PT LONG TERM GOAL #4   Title Pt to demo improved score on BERG by at least 5 points    Time 6    Period Weeks    Status New    Target Date 08/29/21                   Plan - 08/27/21 1400     Clinical Impression Statement Pt reports with several activities today "i cant do it", but in fact is performing the activity very well. Pt demonstrated ability for optimal sit to stand transfer, with use of light UE support, repeatedly today. She also states inability to  put socks on at home, but demonstrates ability to sit and reach feet today in clinic. She was cued for decreasing number of steps she is taking when changing directions today, pt taking several small steps instead of taking fewer bigger steps, improved ability after education and practice today. Pt demonstrates improved gait pattern from previous weeks, with longer step length and increased (correct) use of SPC. She does have some limitations in what she thinks she can do vs what she is actually able to perform. Pt to benefit from continued work on dynamic balance, this is when she is reporting feeling most unsteady "like in going to fall".    Examination-Activity Limitations Locomotion Level;Bend;Transfers;Squat;Stairs    Examination-Participation Restrictions Cleaning;Meal Prep;Community Activity;Shop    Stability/Clinical Decision Making Stable/Uncomplicated    Clinical Decision Making Low    Rehab Potential Good    PT Frequency 2x / week    PT Duration 6 weeks    PT Treatment/Interventions ADLs/Self Care Home Management;Cryotherapy;Electrical Stimulation;DME Instruction;Ultrasound;Traction;Moist Heat;Iontophoresis '4mg'$ /ml Dexamethasone;Gait training;Stair training;Functional mobility training;Therapeutic activities;Therapeutic exercise;Balance training;Neuromuscular re-education;Patient/family education;Manual techniques;Passive range of motion;Dry needling;Energy conservation;Taping;Spinal Manipulations;Joint Manipulations    PT Home Exercise Plan [email or web address]    Consulted and Agree with Plan of Care Patient             Patient will benefit from skilled therapeutic intervention in order to improve the following deficits and impairments:  Abnormal gait, Decreased coordination, Decreased mobility, Decreased strength, Decreased activity tolerance, Decreased range of motion, Decreased balance, Decreased knowledge of precautions, Decreased safety awareness, Difficulty walking  Visit Diagnosis: Other  abnormalities of gait and mobility     Problem List Patient Active Problem  List   Diagnosis Date Noted   Gait disturbance 05/09/2020   Pain in both lower extremities 09/19/2019   Essential hypertension 05/02/2019   Exertional dyspnea 05/02/2019   Memory loss 11/20/2017   Muscle weakness 07/17/2017   Lyndee Hensen, PT, DPT 2:08 PM  08/27/21    [facility] [place], [place], [address] Phone: [phone]   Fax:  [phone]  Name: [name] MRN: [id number] Date of Birth: 1941-12-15

## 2021-08-29 ENCOUNTER — Encounter: Payer: Self-pay | Admitting: Physical Therapy

## 2021-08-29 ENCOUNTER — Ambulatory Visit: Payer: Medicare HMO | Admitting: Physical Therapy

## 2021-08-29 ENCOUNTER — Other Ambulatory Visit: Payer: Self-pay

## 2021-08-29 DIAGNOSIS — R2689 Other abnormalities of gait and mobility: Secondary | ICD-10-CM | POA: Diagnosis not present

## 2021-09-02 ENCOUNTER — Encounter: Payer: Medicare HMO | Admitting: Physical Therapy

## 2021-09-04 ENCOUNTER — Encounter: Payer: Self-pay | Admitting: Physical Therapy

## 2021-09-04 NOTE — Therapy (Signed)
[facility] [address] [place], [place], [address] Phone: [phone]   Fax:  [phone]  Physical Therapy Treatment  Patient Details  Name: [name] MRN: [id number] Date of Birth: January 16, 1941 Referring Provider (PT): Alyssa Allwardt   Encounter Date: 08/29/2021   PT End of Session - 09/04/21 1434     Visit Number 7    Number of Visits 12    Date for PT Re-Evaluation 08/29/21    Authorization Type [employer]    PT Start Time 1216    PT Stop Time 1245    PT Time Calculation (min) 29 min    Equipment Utilized During Treatment Gait belt    Activity Tolerance Patient tolerated treatment well    Behavior During Therapy [facility] for tasks assessed/performed             Past Medical History:  Diagnosis Date   Allergy    Arthritis    Cancer ([place])    kidney, seeing dr in [place] disease    Reflux     Past Surgical History:  Procedure Laterality Date   ABDOMINAL HYSTERECTOMY     COLONOSCOPY W/ BIOPSIES  02/27/2021   Repeat in 5 years   [place]  2018   TRIGGER FINGER RELEASE  2018    There were no vitals filed for this visit.   Subjective Assessment - 09/04/21 1434     Subjective Pt states "feeling woozey" today. She states she not dizzy, but isnt sure "if theres something going on with my eyes". She did start taking new medication a couple days ago, feels maybe this is contributing to that.    Currently in Pain? No/denies                               [facility] Adult PT Treatment/Exercise - 09/04/21 0001       Knee/Hip Exercises: Standing   Other Standing Knee Exercises standing reachging outside of BOS x 10 bil; varying heights.      Knee/Hip Exercises: Seated   Other Seated Knee/Hip Exercises seated trunk control reaching for cones x 5 ibil    Other Seated Knee/Hip Exercises Rows, Horiz abd, shoulder ER, RTB x 15 each.    Sit to  [employer] 5 reps;2 sets                       PT Short Term Goals - 07/21/21 2058       PT SHORT TERM GOAL #1   Title Pt to be independent with initial HEP    Time 2    Period Weeks    Status New    Target Date 08/01/21      PT SHORT TERM GOAL #2   Title Pt to demo ability to rise from regular height chair, on first attempt consistently, with minimal or no UE support    Time 2    Period Weeks    Status New    Target Date 08/01/21               PT Long Term Goals - 07/21/21 2101       PT LONG TERM GOAL #1   Title Pt to be independent with final HEP    Time 6    Period Weeks    Status New    Target Date  08/29/21      PT LONG TERM GOAL #2   Title Pt to demo improved score on 5 time sit to stand by at least 5 seconds.    Time 6    Period Weeks    Status New    Target Date 08/29/21      PT LONG TERM GOAL #3   Title Pt to demo improved score on TUG by at least 9 points, to improve gait speed and efficiency.    Time 6    Period Weeks    Status New    Target Date 08/29/21      PT LONG TERM GOAL #4   Title Pt to demo improved score on BERG by at least 5 points    Time 6    Period Weeks    Status New    Target Date 08/29/21                   Plan - 09/04/21 1437     Clinical Impression Statement Decreased activities and standing activities, gait, balance done today due to pt feeling a little off. She denies being dizzy,  blurry vision. She did start new medication a few days ago, she thinks it may be due to that. I recomended that pt call MD office of prescribing the new med, to discuss this or be seen. i did talk to pts husband on the phone to re-iterate, as pt does have new memory deficits. Pt states that her eyes have felt "off for a while". Pt vague with description, making it difficulty to assess, but did recommend she f/u with MD. Pt did well with seated trunk control. She is improving with sit to stand, but is inconsistent at times with 1st  attempt. Pt to benefit from continued care for standing balance, gait, and safety with mobility.    Examination-Activity Limitations Locomotion Level;Bend;Transfers;Squat;Stairs    Examination-Participation Restrictions Cleaning;Meal Prep;Community Activity;Shop    Stability/Clinical Decision Making Stable/Uncomplicated    Rehab Potential Good    PT Frequency 2x / week    PT Duration 6 weeks    PT Treatment/Interventions ADLs/Self Care Home Management;Cryotherapy;Electrical Stimulation;DME Instruction;Ultrasound;Traction;Moist Heat;Iontophoresis '4mg'$ /ml Dexamethasone;Gait training;Stair training;Functional mobility training;Therapeutic activities;Therapeutic exercise;Balance training;Neuromuscular re-education;Patient/family education;Manual techniques;Passive range of motion;Dry needling;Energy conservation;Taping;Spinal Manipulations;Joint Manipulations    PT Home Exercise Plan [email or web address]    Consulted and Agree with Plan of Care Patient             Patient will benefit from skilled therapeutic intervention in order to improve the following deficits and impairments:  Abnormal gait, Decreased coordination, Decreased mobility, Decreased strength, Decreased activity tolerance, Decreased range of motion, Decreased balance, Decreased knowledge of precautions, Decreased safety awareness, Difficulty walking  Visit Diagnosis: Other abnormalities of gait and mobility     Problem List Patient Active Problem List   Diagnosis Date Noted   Gait disturbance 05/09/2020   Pain in both lower extremities 09/19/2019   Essential hypertension 05/02/2019   Exertional dyspnea 05/02/2019   Memory loss 11/20/2017   Muscle weakness 07/17/2017   Lyndee Hensen, PT, DPT 2:41 PM  09/04/21    [facility], [place], [address] Phone: [phone]   Fax:  [phone]  Name: [name] MRN: [id number] Date of Birth: 08-Mar-1941

## 2021-09-05 DIAGNOSIS — G20 Parkinson's disease: Secondary | ICD-10-CM | POA: Diagnosis not present

## 2021-09-05 DIAGNOSIS — Z79899 Other long term (current) drug therapy: Secondary | ICD-10-CM | POA: Diagnosis not present

## 2021-09-05 DIAGNOSIS — R413 Other amnesia: Secondary | ICD-10-CM | POA: Diagnosis not present

## 2021-09-06 ENCOUNTER — Encounter: Payer: Medicare HMO | Admitting: Physical Therapy

## 2021-09-09 ENCOUNTER — Other Ambulatory Visit (HOSPITAL_COMMUNITY): Payer: Self-pay

## 2021-09-09 DIAGNOSIS — R1310 Dysphagia, unspecified: Secondary | ICD-10-CM

## 2021-09-16 ENCOUNTER — Ambulatory Visit (HOSPITAL_COMMUNITY)
Admission: RE | Admit: 2021-09-16 | Discharge: 2021-09-16 | Disposition: A | Discharge status: Home / Self Care | Payer: Medicare HMO | Source: Ambulatory Visit | Attending: Neurology | Admitting: Neurology

## 2021-09-16 ENCOUNTER — Other Ambulatory Visit: Payer: Self-pay | Admitting: Cardiology

## 2021-09-16 ENCOUNTER — Other Ambulatory Visit: Payer: Self-pay

## 2021-09-16 DIAGNOSIS — R1312 Dysphagia, oropharyngeal phase: Secondary | ICD-10-CM

## 2021-09-16 DIAGNOSIS — I10 Essential (primary) hypertension: Secondary | ICD-10-CM

## 2021-09-16 DIAGNOSIS — K219 Gastro-esophageal reflux disease without esophagitis: Secondary | ICD-10-CM | POA: Diagnosis not present

## 2021-09-16 DIAGNOSIS — F0390 Unspecified dementia without behavioral disturbance: Secondary | ICD-10-CM | POA: Insufficient documentation

## 2021-09-16 DIAGNOSIS — R1310 Dysphagia, unspecified: Secondary | ICD-10-CM

## 2021-09-16 NOTE — Therapy (Signed)
Modified Barium Swallow Progress Note  Patient Details  Name: [name] MRN: [id number] Date of Birth: [birth year]  Today's Date: 09/16/2021  Modified Barium Swallow completed.  Full report located under Chart Review in the Imaging Section.  Brief recommendations include the following:  Clinical Impression Pt presents with functional oral and pharyngeal swallow. Timely oral prep and propulsion noted, with no oral residue seen.   Pharyngeal swallow reflex is also timely, without penetration, aspiration, or post-swallow residue of any consistency.   Esophageal sweep revealed it to be without significant stasis.  Recommend continuing with regular diet and thin liquids. Pt/spouse report concerns about pt's voice declining over the past couple months. Recommend consideration of ENT referral with subsequent referral to outpatient speech therapy, given Parkinson's-type symptoms.    Swallow Evaluation Recommendations  Recommended Consults: Consider ENT evaluation (Pt/spouse concerned about her voice quality)   SLP Diet Recommendations: Regular solids;Thin liquid   Liquid Administration via: Cup;Straw   Medication Administration: Whole meds with liquid   Supervision: Patient able to self feed   Compensations: Slow rate;Small sips/bites   Postural Changes: Seated upright at 90 degrees   Oral Care Recommendations: Oral care BID  [name] B. [place] [place], [facility], [place] Speech Language Pathologist Office: [phone]  [name] 09/16/2021,3:13 PM

## 2021-09-17 ENCOUNTER — Ambulatory Visit: Payer: Medicare HMO | Admitting: Physical Therapy

## 2021-09-17 ENCOUNTER — Encounter: Payer: Self-pay | Admitting: Physical Therapy

## 2021-09-17 DIAGNOSIS — R2689 Other abnormalities of gait and mobility: Secondary | ICD-10-CM

## 2021-09-17 NOTE — Therapy (Signed)
[facility] [address] [place], [place], [address] Phone: [phone]   Fax:  [phone]  Physical Therapy Treatment/Re-Cert   Patient Details  Name: [name] MRN: [id number] Date of Birth: [birth year] Referring Provider (PT): Alyssa Allwardt   Encounter Date: 09/17/2021   PT End of Session - 09/17/21 1226     Visit Number 8    Number of Visits 20    Date for PT Re-Evaluation 10/29/21    Authorization Type [employer]   recert at visit 8    PT Start Time 1217    PT Stop Time 1300    PT Time Calculation (min) 43 min    Equipment Utilized During Treatment Gait belt    Activity Tolerance Patient tolerated treatment well    Behavior During Therapy [facility] for tasks assessed/performed             Past Medical History:  Diagnosis Date   Allergy    Arthritis    Cancer ([place])    kidney, seeing dr in [place]    Reflux     Past Surgical History:  Procedure Laterality Date   ABDOMINAL HYSTERECTOMY     COLONOSCOPY W/ BIOPSIES  02/27/2021   Repeat in 5 years   [place]  2018   TRIGGER FINGER RELEASE  2018    There were no vitals filed for this visit.   Subjective Assessment - 09/17/21 1223     Subjective Pt with no new complaints. Walking into clinic today, pt holding cane up off the ground, not using for support.    Currently in Pain? No/denies                [facility] PT Assessment - 09/17/21 0001       Transfers   Five time sit to stand comments  15.69 sec, no UE support      Standardized Balance Assessment   Standardized Balance Assessment Timed Up and Go Test      Berg Balance Test   Sit to Stand Able to stand without using hands and stabilize independently    Standing Unsupported Able to stand safely 2 minutes    Sitting with Back Unsupported but Feet Supported on Floor or Stool Able to sit safely and securely 2 minutes    Stand to  Sit Controls descent by using hands    Transfers Able to transfer safely, minor use of hands    Standing Unsupported with Eyes Closed Able to stand 10 seconds safely    Standing Unsupported with Feet Together Able to place feet together independently and stand 1 minute safely    From Standing, Reach Forward with Outstretched Arm Can reach forward >12 cm safely (5")    From Standing Position, Pick up Object from Floor Able to pick up shoe safely and easily    From Standing Position, Turn to Look Behind Over each Shoulder Looks behind one side only/other side shows less weight shift    Turn 360 Degrees Able to turn 360 degrees safely but slowly    Standing Unsupported, Alternately Place Feet on Step/Stool Able to complete 4 steps without aid or supervision    Standing Unsupported, One Foot in Front Able to take small step independently and hold 30 seconds    Standing on One Leg Tries to lift leg/unable to hold 3 seconds but remains standing independently    Total Score 44  Timed Up and Go Test   TUG Comments Eval: 21.57   9/27: 14.8 sec no AD.                           [facility] Adult PT Treatment/Exercise - 09/17/21 0001       Ambulation/Gait   Gait Comments 35 ft x 8 with education on importance of use, and sequencing with SPC.      Therapeutic Activites    Therapeutic Activities --   transfers, safety with transfers, stairs.     Knee/Hip Exercises: Aerobic   Recumbent Bike L 1 x 6 min;      Knee/Hip Exercises: Standing   Hip Flexion 20 reps    Stairs up/down 5 steps, 1 HR and SPC x 6. step to.    Other Standing Knee Exercises Staggered stance weight shifts x 10, with reaching /same side x 10 ea;      Knee/Hip Exercises: Seated   Sit to [employer] 15 reps   education on form, hand use, and controlling descent for safety                      PT Short Term Goals - 09/17/21 1226       PT SHORT TERM GOAL #1   Title Pt to be independent with initial HEP     Time 2    Period Weeks    Status Achieved      PT SHORT TERM GOAL #2   Title Pt to demo ability to rise from regular height chair, on first attempt consistently, with minimal or no UE support    Time 2    Period Weeks    Status Partially Met    Target Date 10/01/21               PT Long Term Goals - 09/17/21 1227       PT LONG TERM GOAL #1   Title Pt to be independent with final HEP    Time 6    Period Weeks    Status On-going    Target Date 10/29/21      PT LONG TERM GOAL #2   Title Pt to demo improved score on 5 time sit to stand by at least 5 seconds.    Time 6    Period Weeks    Status Partially Met    Target Date 10/29/21      PT LONG TERM GOAL #3   Title Pt to demo improved score on TUG by at least 9 points, to improve gait speed and efficiency.    Time 6    Period Weeks    Status Achieved    Target Date 10/29/21      PT LONG TERM GOAL #4   Title Pt to demo improved score on BERG by at least 5 points    Time 6    Period Weeks    Status Partially Met    Target Date 10/29/21                   Plan - 09/17/21 1415     Clinical Impression Statement Pt continues to express frustration with her current mobility status. She does demonstrate good ability for transfers in clinic, but states she has a hard time at home if shes been sitting for a longer time, she gets stiff when she is trying to get up. Continued review of safe  and effective tranfer technique. Pt with mild limitation for not fully controlling desent, but is not unsafe with this at this time. Education and practice for use of SPC with walking. She has tendency for not keeping it on the ground, but is abe to use appropriately with verbal cuing to do so. Pt with improved score on TUG. She does require some cueing to remember safety factors, slow speed, larger step height, and use of cane, but is able to walk safety when cued. Pt will benefit from continued care, to improve LE strength,  balance, safety with fucntinoal activity and to meet LTGs. Discussed with husband and pt that i do think it would be helpful for him to come to a session to understand safety cues needed.    Examination-Activity Limitations Locomotion Level;Bend;Transfers;Squat;Stairs    Examination-Participation Restrictions Cleaning;Meal Prep;Community Activity;Shop    Stability/Clinical Decision Making Stable/Uncomplicated    Rehab Potential Good    PT Frequency 2x / week    PT Duration 6 weeks    PT Treatment/Interventions ADLs/Self Care Home Management;Cryotherapy;Electrical Stimulation;DME Instruction;Ultrasound;Traction;Moist Heat;Iontophoresis 37m/ml Dexamethasone;Gait training;Stair training;Functional mobility training;Therapeutic activities;Therapeutic exercise;Balance training;Neuromuscular re-education;Patient/family education;Manual techniques;Passive range of motion;Dry needling;Energy conservation;Taping;Spinal Manipulations;Joint Manipulations    PT Home Exercise Plan [email or web address]    Consulted and Agree with Plan of Care Patient             Patient will benefit from skilled therapeutic intervention in order to improve the following deficits and impairments:  Abnormal gait, Decreased coordination, Decreased mobility, Decreased strength, Decreased activity tolerance, Decreased range of motion, Decreased balance, Decreased knowledge of precautions, Decreased safety awareness, Difficulty walking  Visit Diagnosis: Other abnormalities of gait and mobility     Problem List Patient Active Problem List   Diagnosis Date Noted   Gait disturbance 05/09/2020   Pain in both lower extremities 09/19/2019   Essential hypertension 05/02/2019   Exertional dyspnea 05/02/2019   Memory loss 11/20/2017   Muscle weakness 07/17/2017    LLyndee Hensen PT, DPT 2:25 PM  09/17/21    [place] [address]Phone: [phone]  Fax:   [phone] Name: PELDINE RENCHERMRN: [id number] of Birth: 1September 25, 1942

## 2021-09-19 ENCOUNTER — Encounter: Payer: Medicare HMO | Admitting: Physical Therapy

## 2021-09-23 ENCOUNTER — Other Ambulatory Visit: Payer: Self-pay | Admitting: Cardiology

## 2021-09-23 DIAGNOSIS — I10 Essential (primary) hypertension: Secondary | ICD-10-CM

## 2021-09-30 ENCOUNTER — Ambulatory Visit: Payer: Medicare HMO | Admitting: Physical Therapy

## 2021-09-30 ENCOUNTER — Other Ambulatory Visit: Payer: Self-pay

## 2021-09-30 DIAGNOSIS — R2689 Other abnormalities of gait and mobility: Secondary | ICD-10-CM

## 2021-10-01 DIAGNOSIS — H00012 Hordeolum externum right lower eyelid: Secondary | ICD-10-CM | POA: Diagnosis not present

## 2021-10-03 ENCOUNTER — Ambulatory Visit: Payer: Medicare HMO | Admitting: Physical Therapy

## 2021-10-03 ENCOUNTER — Encounter: Payer: Self-pay | Admitting: Physical Therapy

## 2021-10-03 ENCOUNTER — Other Ambulatory Visit: Payer: Self-pay

## 2021-10-03 DIAGNOSIS — R2689 Other abnormalities of gait and mobility: Secondary | ICD-10-CM | POA: Diagnosis not present

## 2021-10-03 NOTE — Therapy (Signed)
[facility] [address] [place], [place], [address] Phone: [phone]   Fax:  [phone]  Physical Therapy Treatment  Patient Details  Name: [name] MRN: [id number] Date of Birth: 05-Feb-1941 Referring Provider (PT): Alyssa Allwardt   Encounter Date: 09/30/2021   PT End of Session - 10/03/21 1316     Visit Number 9    Number of Visits 20    Date for PT Re-Evaluation 10/29/21    Authorization Type [employer]   recert at visit 8    PT Start Time 1345    PT Stop Time 1430    PT Time Calculation (min) 45 min    Equipment Utilized During Treatment Gait belt    Activity Tolerance Patient tolerated treatment well    Behavior During Therapy [facility] for tasks assessed/performed             Past Medical History:  Diagnosis Date   Allergy    Arthritis    Cancer ([place])    kidney, seeing dr in [place] disease    Reflux     Past Surgical History:  Procedure Laterality Date   ABDOMINAL HYSTERECTOMY     COLONOSCOPY W/ BIOPSIES  02/27/2021   Repeat in 5 years   [place]  2018   TRIGGER FINGER RELEASE  2018    There were no vitals filed for this visit.   Subjective Assessment - 10/03/21 1310     Subjective Pt had a fall at home. She was upstairs, getting up out of chair, and fell forward, also hit another piece of furniture. Was unable to get up, was home alone, she crawled down a flight of stairs, and got up to seated position on couch.She has a life alert, but was not wearing it. States no injury, no other pain, etc.    Currently in Pain? No/denies                               [facility] Adult PT Treatment/Exercise - 10/03/21 1318       Ambulation/Gait   Gait Comments 35 ft x10 with education for need for use of SPC, practice for safe direction changes.      Knee/Hip Exercises: Standing   Hip Flexion 20 reps    Stairs up/down 5 steps, 1 HR  and SPC x 6. recipricol    Other Standing Knee Exercises fwd/bwd weight shifts x 20; staggered stance weight shifts x 10 bil w reach; torso turns x 10 bil; Lateral stepping w weight shifts.      Knee/Hip Exercises: Seated   Sit to Sand 15 reps   cueing for increased anterior trunk lean, and attempting to rise of 1st try.                      PT Short Term Goals - 09/17/21 1226       PT SHORT TERM GOAL #1   Title Pt to be independent with initial HEP    Time 2    Period Weeks    Status Achieved      PT SHORT TERM GOAL #2   Title Pt to demo ability to rise from regular height chair, on first attempt consistently, with minimal or no UE support    Time 2    Period Weeks    Status Partially Met  Target Date 10/01/21               PT Long Term Goals - 09/17/21 1227       PT LONG TERM GOAL #1   Title Pt to be independent with final HEP    Time 6    Period Weeks    Status On-going    Target Date 10/29/21      PT LONG TERM GOAL #2   Title Pt to demo improved score on 5 time sit to stand by at least 5 seconds.    Time 6    Period Weeks    Status Partially Met    Target Date 10/29/21      PT LONG TERM GOAL #3   Title Pt to demo improved score on TUG by at least 9 points, to improve gait speed and efficiency.    Time 6    Period Weeks    Status Achieved    Target Date 10/29/21      PT LONG TERM GOAL #4   Title Pt to demo improved score on BERG by at least 5 points    Time 6    Period Weeks    Status Partially Met    Target Date 10/29/21                   Plan - 10/03/21 1321     Clinical Impression Statement Discussion today on home safety, need for constant wearing of life alert. Discussed safety risk with trying to go down stairs alone after the fall. Pt req. mod verbal cueing for safety awareness in clinic. She is able to follow commands well, but motor planning and initiating safe movement is fair. Pt with good ability for sit to stand  transfer after she has done it a few times, but sheh has poor ability for rising on 1st try. Plan to continue balance, initiating movment, and safety with functional activities.    Examination-Activity Limitations Locomotion Level;Bend;Transfers;Squat;Stairs    Examination-Participation Restrictions Cleaning;Meal Prep;Community Activity;Shop    Stability/Clinical Decision Making Stable/Uncomplicated    Rehab Potential Good    PT Frequency 2x / week    PT Duration 6 weeks    PT Treatment/Interventions ADLs/Self Care Home Management;Cryotherapy;Electrical Stimulation;DME Instruction;Ultrasound;Traction;Moist Heat;Iontophoresis 4m/ml Dexamethasone;Gait training;Stair training;Functional mobility training;Therapeutic activities;Therapeutic exercise;Balance training;Neuromuscular re-education;Patient/family education;Manual techniques;Passive range of motion;Dry needling;Energy conservation;Taping;Spinal Manipulations;Joint Manipulations    PT Home Exercise Plan [id number]    Consulted and Agree with Plan of Care Patient             Patient will benefit from skilled therapeutic intervention in order to improve the following deficits and impairments:  Abnormal gait, Decreased coordination, Decreased mobility, Decreased strength, Decreased activity tolerance, Decreased range of motion, Decreased balance, Decreased knowledge of precautions, Decreased safety awareness, Difficulty walking  Visit Diagnosis: Other abnormalities of gait and mobility     Problem List Patient Active Problem List   Diagnosis Date Noted   Gait disturbance 05/09/2020   Pain in both lower extremities 09/19/2019   Essential hypertension 05/02/2019   Exertional dyspnea 05/02/2019   Memory loss 11/20/2017   Muscle weakness 07/17/2017    LLyndee Hensen PT, DPT 1:24 PM  10/03/21    [place] [address]Phone: [phone]  Fax:   [phone] Name: PAHJANAE CASSELMRN: [id number] of Birth: [birth year]

## 2021-10-03 NOTE — Therapy (Addendum)
[facility] [address] [place], [place], [address] Phone: [phone]   Fax:  [phone]  Physical Therapy Treatment  Patient Details  Name: [name] MRN: [id number] Date of Birth: [birth year] Referring Provider (PT): Alyssa Allwardt   Encounter Date: 10/03/2021   PT End of Session - 10/03/21 1337     Visit Number 10    Number of Visits 20    Date for PT Re-Evaluation 10/29/21    Authorization Type [employer]   recert at visit 8    PT Start Time 1215    PT Stop Time 1300    PT Time Calculation (min) 45 min             Past Medical History:  Diagnosis Date   Allergy    Arthritis    Cancer ([place])    kidney, seeing dr in [place] disease    Reflux     Past Surgical History:  Procedure Laterality Date   ABDOMINAL HYSTERECTOMY     COLONOSCOPY W/ BIOPSIES  02/27/2021   Repeat in 5 years   Bromide  2018   Ethel  2018    There were no vitals filed for this visit.   Subjective Assessment - 10/03/21 1336     Subjective Pt with no new complaints , she states "im tired of feeling like this, i want to feel like my old self again" .    Currently in Pain? No/denies                               [facility] Adult PT Treatment/Exercise - 10/03/21 1334       Ambulation/Gait   Gait Comments ambulation with speed changes, direction changes and practice changing speeds to navigate narrow spaces, using SPC. Practice with RW 35 ft x 6;      Knee/Hip Exercises: Aerobic   Recumbent Bike L 1 x 6 min;      Knee/Hip Exercises: Standing   Hip Flexion 20 reps    Hip Flexion Limitations light , 1 UE support    Stairs up/down 5 steps, 1 HR and SPC x 6. recipricol    Other Standing Knee Exercises toe taps 6 in steps x 20, no UE support;  Standing Rows GT x 20;  Standing tband torso turn with cross/press GTB x 10 bil;    Other Standing  Knee Exercises bwd walking, side stepping 20 ft x 4 ea;      Knee/Hip Exercises: Seated   Sit to Sand 15 reps                       PT Short Term Goals - 09/17/21 1226       PT SHORT TERM GOAL #1   Title Pt to be independent with initial HEP    Time 2    Period Weeks    Status Achieved      PT SHORT TERM GOAL #2   Title Pt to demo ability to rise from regular height chair, on first attempt consistently, with minimal or no UE support    Time 2    Period Weeks    Status Partially Met    Target Date 10/01/21               PT Long Term Goals - 09/17/21 1227  PT LONG TERM GOAL #1   Title Pt to be independent with final HEP    Time 6    Period Weeks    Status On-going    Target Date 10/29/21      PT LONG TERM GOAL #2   Title Pt to demo improved score on 5 time sit to stand by at least 5 seconds.    Time 6    Period Weeks    Status Partially Met    Target Date 10/29/21      PT LONG TERM GOAL #3   Title Pt to demo improved score on TUG by at least 9 points, to improve gait speed and efficiency.    Time 6    Period Weeks    Status Achieved    Target Date 10/29/21      PT LONG TERM GOAL #4   Title Pt to demo improved score on BERG by at least 5 points    Time 6    Period Weeks    Status Partially Met    Target Date 10/29/21                   Plan - 10/03/21 1339     Clinical Impression Statement Pt with decreased motor planning , as well as safety awareness. She is able to take direction correct with verbal cueing, as seen with practice for navigating narrow space, pt did not slow speed for safety, but was able to do do when cued. She continues to struggle with standing from chair on 1st attmept. She has minimal LOB with static exercises, but does have minor LOB with walking, bwd walking, and direction changes. Plan to continue balance and safety as able.    Examination-Activity Limitations Locomotion Level;Bend;Transfers;Squat;Stairs     Examination-Participation Restrictions Cleaning;Meal Prep;Community Activity;Shop    Stability/Clinical Decision Making Stable/Uncomplicated    Rehab Potential Good    PT Frequency 2x / week    PT Duration 6 weeks    PT Treatment/Interventions ADLs/Self Care Home Management;Cryotherapy;Electrical Stimulation;DME Instruction;Ultrasound;Traction;Moist Heat;Iontophoresis 30m/ml Dexamethasone;Gait training;Stair training;Functional mobility training;Therapeutic activities;Therapeutic exercise;Balance training;Neuromuscular re-education;Patient/family education;Manual techniques;Passive range of motion;Dry needling;Energy conservation;Taping;Spinal Manipulations;Joint Manipulations    PT Home Exercise Plan [id number]    Consulted and Agree with Plan of Care Patient             Patient will benefit from skilled therapeutic intervention in order to improve the following deficits and impairments:  Abnormal gait, Decreased coordination, Decreased mobility, Decreased strength, Decreased activity tolerance, Decreased range of motion, Decreased balance, Decreased knowledge of precautions, Decreased safety awareness, Difficulty walking  Visit Diagnosis: Other abnormalities of gait and mobility     Problem List Patient Active Problem List   Diagnosis Date Noted   Gait disturbance 05/09/2020   Pain in both lower extremities 09/19/2019   Essential hypertension 05/02/2019   Exertional dyspnea 05/02/2019   Memory loss 11/20/2017   Muscle weakness 07/17/2017   LLyndee Hensen PT, DPT 1:43 PM  10/03/21    [place] [place] [address]Phone: [phone]  Fax:  [phone] Name: Bethany STRIDERMRN: [id number] of Birth: [birth year] PHYSICAL THERAPY DISCHARGE SUMMARY  Visits from Start of Care:  10   Plan: Patient agrees to discharge.  Patient goals were partially met. Patient is being discharged due to - not  returning since last visit.    LLyndee Hensen PT, DPT 12:18 PM  02/03/22

## 2021-10-07 ENCOUNTER — Encounter: Payer: Medicare HMO | Admitting: Physical Therapy

## 2021-10-09 ENCOUNTER — Encounter: Payer: Medicare HMO | Admitting: Physical Therapy

## 2021-10-10 ENCOUNTER — Ambulatory Visit
Admission: RE | Admit: 2021-10-10 | Discharge: 2021-10-10 | Disposition: A | Discharge status: Home / Self Care | Payer: Medicare HMO | Source: Ambulatory Visit | Attending: Obstetrics & Gynecology | Admitting: Obstetrics & Gynecology

## 2021-10-10 ENCOUNTER — Other Ambulatory Visit: Payer: Self-pay

## 2021-10-10 ENCOUNTER — Other Ambulatory Visit: Payer: Self-pay | Admitting: Obstetrics & Gynecology

## 2021-10-10 DIAGNOSIS — R922 Inconclusive mammogram: Secondary | ICD-10-CM | POA: Diagnosis not present

## 2021-10-10 DIAGNOSIS — R921 Mammographic calcification found on diagnostic imaging of breast: Secondary | ICD-10-CM

## 2021-10-17 ENCOUNTER — Encounter: Payer: Medicare HMO | Admitting: Physical Therapy

## 2021-10-18 DIAGNOSIS — F0390 Unspecified dementia without behavioral disturbance: Secondary | ICD-10-CM | POA: Diagnosis not present

## 2021-10-18 DIAGNOSIS — R4789 Other speech disturbances: Secondary | ICD-10-CM | POA: Diagnosis not present

## 2021-10-18 DIAGNOSIS — R69 Illness, unspecified: Secondary | ICD-10-CM | POA: Diagnosis not present

## 2021-10-22 ENCOUNTER — Encounter: Payer: Self-pay | Admitting: Physician Assistant

## 2021-10-22 ENCOUNTER — Telehealth: Payer: Self-pay

## 2021-10-22 ENCOUNTER — Other Ambulatory Visit: Payer: Self-pay

## 2021-10-22 ENCOUNTER — Ambulatory Visit (INDEPENDENT_AMBULATORY_CARE_PROVIDER_SITE_OTHER): Payer: Medicare HMO | Admitting: Physician Assistant

## 2021-10-22 ENCOUNTER — Other Ambulatory Visit: Payer: Self-pay | Admitting: Geriatric Medicine

## 2021-10-22 DIAGNOSIS — R4789 Other speech disturbances: Secondary | ICD-10-CM

## 2021-10-22 DIAGNOSIS — R490 Dysphonia: Secondary | ICD-10-CM | POA: Insufficient documentation

## 2021-10-22 DIAGNOSIS — F0390 Unspecified dementia without behavioral disturbance: Secondary | ICD-10-CM | POA: Insufficient documentation

## 2021-10-22 DIAGNOSIS — E538 Deficiency of other specified B group vitamins: Secondary | ICD-10-CM | POA: Diagnosis not present

## 2021-10-22 DIAGNOSIS — R479 Unspecified speech disturbances: Secondary | ICD-10-CM | POA: Insufficient documentation

## 2021-10-22 DIAGNOSIS — F0280 Dementia in other diseases classified elsewhere without behavioral disturbance: Secondary | ICD-10-CM | POA: Insufficient documentation

## 2021-10-22 DIAGNOSIS — R69 Illness, unspecified: Secondary | ICD-10-CM | POA: Diagnosis not present

## 2021-10-22 NOTE — Telephone Encounter (Signed)
error 

## 2021-10-22 NOTE — Progress Notes (Signed)
Virtual Visit via Video Note  I connected with  [name]  on 10/22/21 at 11:45 AM EDT by a video enabled telemedicine application and verified that I am speaking with the correct person using two identifiers.  Location: Patient: home Provider: Therapist, music at [place] present: Patient, patient's husband, and myself   I discussed the limitations of evaluation and management by telemedicine and the availability of in person appointments. The patient expressed understanding and agreed to proceed.   History of Present Illness:  Pt presents with her husband today via video visit to discuss recent visit with [facility].  Per note 10/18/21 - Dr. Nash Dimmer, neurologist (for Dementia):  "To Do List:" - Schedule your CT (I printed out the referral for you to use at an imaging center in [place]). - I recommend that you get a fall alert.  - Start working with speech therapy for speech/swallowing (I printed out the referral for you to find a therapy center in [place]).  - Continue an exercise regimen that includes 150 minutes per week of moderate-intensity aerobic exercise (like walking) and 2 days per week of full body resistance training (training with light weights). See below for details and community resources to help you exercise. Exercise is one of the few interventions we have that improves cognitive function.  - Start a heart healthy diet like the Mediterranean diet. See below for details about the Mediterranean and other diets that are good for brain health.   FOLLOW UP: 2 months   Patient and husband hoping to have me help put this plan into place in the [place] area -  -Requesting speech pathology in [place]; husband says she had swallowing test and it was normal. -CT head without contrast  -B12 last checked in 2018, would like this updated   Observations/Objective:   Gen: Awake, alert, no acute distress Resp: Breathing is  even and non-labored Psych: calm/pleasant demeanor Neuro: Alert and Oriented, + facial symmetry, speech is clear.   Assessment and Plan: 1. Dementia without behavioral disturbance ([place]) -Reviewed records from Dr. Jerline Pain -Pt started on Aricept 5 mg daily -Will plan for CT brain w/o contrast locally; pt's husband has order and will bring this by today for [place]  2. Other speech disturbance -Referral for speech therapy, again will plan to do this local for patient. Husband to bring order by for me as well.  3. Vitamin B12 deficiency - I will place future lab order for B12 to have drawn at her convenience and then forward copy to Dr. Marigene Ehlers office.   Follow Up Instructions:    I discussed the assessment and treatment plan with the patient. The patient was provided an opportunity to ask questions and all were answered. The patient agreed with the plan and demonstrated an understanding of the instructions.   The patient was advised to call back or seek an in-person evaluation if the symptoms worsen or if the condition fails to improve as anticipated.  Aashika Carta M Anahi Belmar, PA-C

## 2021-10-22 NOTE — Patient Instructions (Signed)
Please call to have B12 lab scheduled.   I will set up order for CT brain w/o contrast and speech pathology referral.

## 2021-10-23 ENCOUNTER — Other Ambulatory Visit: Payer: Self-pay | Admitting: Physician Assistant

## 2021-10-23 DIAGNOSIS — R42 Dizziness and giddiness: Secondary | ICD-10-CM

## 2021-10-23 DIAGNOSIS — G309 Alzheimer's disease, unspecified: Secondary | ICD-10-CM

## 2021-10-23 DIAGNOSIS — F0390 Unspecified dementia without behavioral disturbance: Secondary | ICD-10-CM

## 2021-10-23 DIAGNOSIS — R4789 Other speech disturbances: Secondary | ICD-10-CM

## 2021-10-23 DIAGNOSIS — R2689 Other abnormalities of gait and mobility: Secondary | ICD-10-CM

## 2021-10-27 DIAGNOSIS — G309 Alzheimer's disease, unspecified: Secondary | ICD-10-CM | POA: Diagnosis not present

## 2021-10-27 DIAGNOSIS — S0003XA Contusion of scalp, initial encounter: Secondary | ICD-10-CM | POA: Diagnosis not present

## 2021-10-27 DIAGNOSIS — F0280 Dementia in other diseases classified elsewhere without behavioral disturbance: Secondary | ICD-10-CM | POA: Diagnosis not present

## 2021-10-27 DIAGNOSIS — W1789XA Other fall from one level to another, initial encounter: Secondary | ICD-10-CM | POA: Diagnosis not present

## 2021-10-27 DIAGNOSIS — S0990XA Unspecified injury of head, initial encounter: Secondary | ICD-10-CM | POA: Diagnosis not present

## 2021-10-27 DIAGNOSIS — S0093XA Contusion of unspecified part of head, initial encounter: Secondary | ICD-10-CM | POA: Diagnosis not present

## 2021-10-27 DIAGNOSIS — W228XXA Striking against or struck by other objects, initial encounter: Secondary | ICD-10-CM | POA: Diagnosis not present

## 2021-10-27 DIAGNOSIS — R69 Illness, unspecified: Secondary | ICD-10-CM | POA: Diagnosis not present

## 2021-10-27 DIAGNOSIS — Z743 Need for continuous supervision: Secondary | ICD-10-CM | POA: Diagnosis not present

## 2021-11-04 ENCOUNTER — Other Ambulatory Visit: Payer: Self-pay

## 2021-11-04 ENCOUNTER — Other Ambulatory Visit (INDEPENDENT_AMBULATORY_CARE_PROVIDER_SITE_OTHER): Payer: Medicare HMO

## 2021-11-04 DIAGNOSIS — E538 Deficiency of other specified B group vitamins: Secondary | ICD-10-CM

## 2021-11-05 LAB — VITAMIN B12: Vitamin B-12: 875 pg/mL (ref 211–911)

## 2021-11-06 ENCOUNTER — Other Ambulatory Visit: Payer: Medicare HMO

## 2021-11-18 ENCOUNTER — Other Ambulatory Visit: Payer: Self-pay | Admitting: Gastroenterology

## 2021-11-18 DIAGNOSIS — Z1211 Encounter for screening for malignant neoplasm of colon: Secondary | ICD-10-CM

## 2021-11-19 ENCOUNTER — Telehealth: Payer: Self-pay

## 2021-11-19 NOTE — Telephone Encounter (Signed)
Please advise 

## 2021-11-19 NOTE — Telephone Encounter (Signed)
Nurse Assessment Nurse: Jeneen Rinks, RN, [place] Date/Time Eilene Ghazi Time): 11/18/2021 4:58:31 PM Confirm and document reason for call. If symptomatic, describe symptoms. ---Caller believes having side effects from med, fallen twice, having balance issues. Caller states unable to talk, does not seem to help, getting worse. Rivastigmine is name of med.  Does the patient have any new or worsening symptoms? ---Yes Will a triage be completed? ---Yes Related visit to physician within the last 2 weeks? ---No Does the PT have any chronic conditions? (i.e. diabetes, asthma, this includes High risk factors for pregnancy, etc.) ---Unknown Is this a behavioral health or substance abuse call? ---No  Neurologic Deficit  [1] SEVERE weakness (i.e., unable to walk or barely able to walk, requires support) [2] new-onset or worsening Denver Faster 11/18/2021 4:59:49 PM  11/18/2021 4:52:25 PM Send to Urgent Queue Iver Nestle 11/18/2021 4:55:24 PM Attempt made - message left [place] Faster 11/18/2021 5:08:00 PM 911 Outcome Documentation Jeneen Rinks RN, [place] Reason: EMS refused 11/18/2021 5:01:50 PM Call EMS 911 Now Yes Jeneen Rinks, RN, [place]

## 2021-11-20 NOTE — Telephone Encounter (Signed)
Patient husband stated she is ok,her medication is working it is not the cause of her falling.He stated if she calls the clinic he needs to be notified of why. Everything is going well,just needs a referral for speech therapy

## 2021-11-21 ENCOUNTER — Other Ambulatory Visit: Payer: Self-pay

## 2021-11-21 DIAGNOSIS — R4789 Other speech disturbances: Secondary | ICD-10-CM

## 2021-11-21 NOTE — Telephone Encounter (Signed)
Referral placed.

## 2021-11-25 ENCOUNTER — Other Ambulatory Visit: Payer: Medicare HMO

## 2021-12-17 ENCOUNTER — Telehealth: Payer: Self-pay | Admitting: Physician Assistant

## 2021-12-17 ENCOUNTER — Telehealth: Payer: Self-pay

## 2021-12-17 NOTE — Telephone Encounter (Signed)
Pt and spouse are going into [facility] and need paperwork done and need QF gold TB test done as well, they need done before the end of this week 12/20/21

## 2021-12-17 NOTE — Telephone Encounter (Signed)
Call back is [phone] for daughter. [name] for [name] is faxing over paperwork today. They weren't sure what to do since [name] is out today

## 2021-12-17 NOTE — Telephone Encounter (Signed)
MEDICATIONlisinopril (ZESTRIL) 10 MG tablet  PHARMACY:  [facility] [address], [place] - [address] Phone:  [phone]  Fax:  [phone]      Comments:   **Let patient know to contact pharmacy at the end of the day to make sure medication is ready. **  ** Please notify patient to allow 48-72 hours to process**  **Encourage patient to contact the pharmacy for refills or they can request refills through [facility]**

## 2021-12-18 ENCOUNTER — Other Ambulatory Visit: Payer: Self-pay

## 2021-12-18 ENCOUNTER — Ambulatory Visit (INDEPENDENT_AMBULATORY_CARE_PROVIDER_SITE_OTHER): Payer: Medicare HMO

## 2021-12-18 ENCOUNTER — Telehealth: Payer: Self-pay | Admitting: Physician Assistant

## 2021-12-18 ENCOUNTER — Other Ambulatory Visit: Payer: Medicare HMO

## 2021-12-18 DIAGNOSIS — Z23 Encounter for immunization: Secondary | ICD-10-CM | POA: Diagnosis not present

## 2021-12-18 DIAGNOSIS — Z111 Encounter for screening for respiratory tuberculosis: Secondary | ICD-10-CM | POA: Diagnosis not present

## 2021-12-18 DIAGNOSIS — I10 Essential (primary) hypertension: Secondary | ICD-10-CM

## 2021-12-18 MED ORDER — LEVOTHYROXINE SODIUM 75 MCG PO TABS: 75.0000 ug | ORAL_TABLET | Freq: Every day | ORAL | 0 refills | Status: DC

## 2021-12-18 MED ORDER — LISINOPRIL 10 MG PO TABS: 10.0000 mg | ORAL_TABLET | Freq: Every day | ORAL | 0 refills | Status: DC

## 2021-12-18 NOTE — Telephone Encounter (Signed)
Forms received.  

## 2021-12-18 NOTE — Telephone Encounter (Signed)
Rx sent in

## 2021-12-18 NOTE — Telephone Encounter (Signed)
Forms received daughter notified

## 2021-12-19 ENCOUNTER — Telehealth: Payer: Self-pay | Admitting: Physician Assistant

## 2021-12-19 NOTE — Telephone Encounter (Signed)
Error tele note already created

## 2021-12-19 NOTE — Telephone Encounter (Signed)
Lattie [phone]  Calling just to check we sent forms & if we have received anything regarding tb test

## 2021-12-20 ENCOUNTER — Telehealth: Payer: Self-pay

## 2021-12-20 NOTE — Telephone Encounter (Signed)
Requesting call back in regard to discrepancies with medication:    Potassium 99 mg - states potassium does not come in mg - needs clarification.  Calcium 150 mg - states calcium does not come in 150 mg - needs clarification.  Vit D - missing strength and frequency  States patient is moving in tomorrow.  States pharmacy is waiting on response.  States [facility] has called office twice.  I do not see a previous phone note in regard from [facility].

## 2021-12-20 NOTE — Telephone Encounter (Signed)
Forms faxed waiting for results

## 2021-12-21 LAB — QUANTIFERON-TB GOLD PLUS
Mitogen-NIL: >10 IU/mL
NIL: 0.04 IU/mL
QuantiFERON-TB Gold Plus: NEGATIVE
TB1-NIL: 0 IU/mL
TB2-NIL: 0.01 IU/mL

## 2021-12-24 ENCOUNTER — Telehealth: Payer: Self-pay

## 2021-12-24 ENCOUNTER — Ambulatory Visit: Payer: Medicare HMO | Admitting: Physician Assistant

## 2021-12-24 ENCOUNTER — Other Ambulatory Visit: Payer: Self-pay

## 2021-12-24 DIAGNOSIS — D519 Vitamin B12 deficiency anemia, unspecified: Secondary | ICD-10-CM | POA: Diagnosis not present

## 2021-12-24 DIAGNOSIS — E039 Hypothyroidism, unspecified: Secondary | ICD-10-CM | POA: Diagnosis not present

## 2021-12-24 DIAGNOSIS — E559 Vitamin D deficiency, unspecified: Secondary | ICD-10-CM | POA: Diagnosis not present

## 2021-12-24 DIAGNOSIS — R7303 Prediabetes: Secondary | ICD-10-CM | POA: Diagnosis not present

## 2021-12-24 MED ORDER — VENASTAT 300 MG PO CPCR: 300.0000 mg | ORAL_CAPSULE | Freq: Every day | ORAL | 0 refills | Status: AC

## 2021-12-24 MED ORDER — VITAMIN D3 50 MCG (2000 UT) PO TABS: 2000.0000 [IU] | ORAL_TABLET | Freq: Every day | ORAL | 0 refills | Status: AC

## 2021-12-24 MED ORDER — PANTOPRAZOLE SODIUM 40 MG PO TBEC: 40.0000 mg | DELAYED_RELEASE_TABLET | Freq: Every day | ORAL | 0 refills | Status: DC

## 2021-12-24 MED ORDER — MAGNESIUM 100 MG PO CAPS: 100.0000 mg | ORAL_CAPSULE | Freq: Every day | ORAL | 0 refills | Status: AC

## 2021-12-24 MED ORDER — DONEPEZIL HCL 5 MG PO TABS: 5.0000 mg | ORAL_TABLET | Freq: Every day | ORAL | 0 refills | Status: DC

## 2021-12-24 NOTE — Telephone Encounter (Signed)
Forms Faxed

## 2021-12-24 NOTE — Telephone Encounter (Signed)
Is requesting call back at [phone] with quantiferon gold results.    Also needs faxed to [phone].

## 2021-12-24 NOTE — Telephone Encounter (Signed)
Updated with Mr.Leeper

## 2021-12-25 DIAGNOSIS — F4321 Adjustment disorder with depressed mood: Secondary | ICD-10-CM | POA: Diagnosis not present

## 2021-12-25 DIAGNOSIS — R69 Illness, unspecified: Secondary | ICD-10-CM | POA: Diagnosis not present

## 2021-12-26 DIAGNOSIS — M150 Primary generalized (osteo)arthritis: Secondary | ICD-10-CM | POA: Diagnosis not present

## 2021-12-26 DIAGNOSIS — K219 Gastro-esophageal reflux disease without esophagitis: Secondary | ICD-10-CM | POA: Diagnosis not present

## 2021-12-26 DIAGNOSIS — R1310 Dysphagia, unspecified: Secondary | ICD-10-CM | POA: Diagnosis not present

## 2021-12-26 DIAGNOSIS — E0500 Thyrotoxicosis with diffuse goiter without thyrotoxic crisis or storm: Secondary | ICD-10-CM | POA: Diagnosis not present

## 2021-12-26 DIAGNOSIS — R4789 Other speech disturbances: Secondary | ICD-10-CM | POA: Diagnosis not present

## 2021-12-26 DIAGNOSIS — R69 Illness, unspecified: Secondary | ICD-10-CM | POA: Diagnosis not present

## 2021-12-26 DIAGNOSIS — Z9181 History of falling: Secondary | ICD-10-CM | POA: Diagnosis not present

## 2021-12-26 DIAGNOSIS — F0280 Dementia in other diseases classified elsewhere without behavioral disturbance: Secondary | ICD-10-CM | POA: Diagnosis not present

## 2021-12-26 DIAGNOSIS — E538 Deficiency of other specified B group vitamins: Secondary | ICD-10-CM | POA: Diagnosis not present

## 2021-12-31 DIAGNOSIS — R69 Illness, unspecified: Secondary | ICD-10-CM | POA: Diagnosis not present

## 2021-12-31 DIAGNOSIS — M150 Primary generalized (osteo)arthritis: Secondary | ICD-10-CM | POA: Diagnosis not present

## 2021-12-31 DIAGNOSIS — Z9181 History of falling: Secondary | ICD-10-CM | POA: Diagnosis not present

## 2021-12-31 DIAGNOSIS — K219 Gastro-esophageal reflux disease without esophagitis: Secondary | ICD-10-CM | POA: Diagnosis not present

## 2021-12-31 DIAGNOSIS — E538 Deficiency of other specified B group vitamins: Secondary | ICD-10-CM | POA: Diagnosis not present

## 2021-12-31 DIAGNOSIS — E0500 Thyrotoxicosis with diffuse goiter without thyrotoxic crisis or storm: Secondary | ICD-10-CM | POA: Diagnosis not present

## 2021-12-31 DIAGNOSIS — F0280 Dementia in other diseases classified elsewhere without behavioral disturbance: Secondary | ICD-10-CM | POA: Diagnosis not present

## 2021-12-31 DIAGNOSIS — R4789 Other speech disturbances: Secondary | ICD-10-CM | POA: Diagnosis not present

## 2022-01-01 ENCOUNTER — Other Ambulatory Visit: Payer: Self-pay | Admitting: Physician Assistant

## 2022-01-01 DIAGNOSIS — R413 Other amnesia: Secondary | ICD-10-CM

## 2022-01-01 DIAGNOSIS — R69 Illness, unspecified: Secondary | ICD-10-CM | POA: Diagnosis not present

## 2022-01-01 DIAGNOSIS — K219 Gastro-esophageal reflux disease without esophagitis: Secondary | ICD-10-CM | POA: Diagnosis not present

## 2022-01-01 DIAGNOSIS — E0500 Thyrotoxicosis with diffuse goiter without thyrotoxic crisis or storm: Secondary | ICD-10-CM | POA: Diagnosis not present

## 2022-01-01 DIAGNOSIS — G309 Alzheimer's disease, unspecified: Secondary | ICD-10-CM

## 2022-01-01 DIAGNOSIS — F0280 Dementia in other diseases classified elsewhere without behavioral disturbance: Secondary | ICD-10-CM | POA: Diagnosis not present

## 2022-01-01 DIAGNOSIS — Z9181 History of falling: Secondary | ICD-10-CM | POA: Diagnosis not present

## 2022-01-01 DIAGNOSIS — R4789 Other speech disturbances: Secondary | ICD-10-CM | POA: Diagnosis not present

## 2022-01-01 DIAGNOSIS — M6281 Muscle weakness (generalized): Secondary | ICD-10-CM

## 2022-01-01 DIAGNOSIS — M150 Primary generalized (osteo)arthritis: Secondary | ICD-10-CM | POA: Diagnosis not present

## 2022-01-01 DIAGNOSIS — E538 Deficiency of other specified B group vitamins: Secondary | ICD-10-CM | POA: Diagnosis not present

## 2022-01-02 DIAGNOSIS — R69 Illness, unspecified: Secondary | ICD-10-CM | POA: Diagnosis not present

## 2022-01-02 DIAGNOSIS — E039 Hypothyroidism, unspecified: Secondary | ICD-10-CM | POA: Diagnosis not present

## 2022-01-02 DIAGNOSIS — K5900 Constipation, unspecified: Secondary | ICD-10-CM | POA: Diagnosis not present

## 2022-01-02 DIAGNOSIS — R259 Unspecified abnormal involuntary movements: Secondary | ICD-10-CM | POA: Diagnosis not present

## 2022-01-02 DIAGNOSIS — C649 Malignant neoplasm of unspecified kidney, except renal pelvis: Secondary | ICD-10-CM | POA: Diagnosis not present

## 2022-01-02 DIAGNOSIS — G309 Alzheimer's disease, unspecified: Secondary | ICD-10-CM | POA: Diagnosis not present

## 2022-01-02 DIAGNOSIS — I10 Essential (primary) hypertension: Secondary | ICD-10-CM | POA: Diagnosis not present

## 2022-01-02 DIAGNOSIS — K219 Gastro-esophageal reflux disease without esophagitis: Secondary | ICD-10-CM | POA: Diagnosis not present

## 2022-01-03 DIAGNOSIS — F0280 Dementia in other diseases classified elsewhere without behavioral disturbance: Secondary | ICD-10-CM | POA: Diagnosis not present

## 2022-01-03 DIAGNOSIS — Z9181 History of falling: Secondary | ICD-10-CM | POA: Diagnosis not present

## 2022-01-03 DIAGNOSIS — E0500 Thyrotoxicosis with diffuse goiter without thyrotoxic crisis or storm: Secondary | ICD-10-CM | POA: Diagnosis not present

## 2022-01-03 DIAGNOSIS — R69 Illness, unspecified: Secondary | ICD-10-CM | POA: Diagnosis not present

## 2022-01-03 DIAGNOSIS — E538 Deficiency of other specified B group vitamins: Secondary | ICD-10-CM | POA: Diagnosis not present

## 2022-01-03 DIAGNOSIS — M150 Primary generalized (osteo)arthritis: Secondary | ICD-10-CM | POA: Diagnosis not present

## 2022-01-03 DIAGNOSIS — K219 Gastro-esophageal reflux disease without esophagitis: Secondary | ICD-10-CM | POA: Diagnosis not present

## 2022-01-03 DIAGNOSIS — R4789 Other speech disturbances: Secondary | ICD-10-CM | POA: Diagnosis not present

## 2022-01-08 DIAGNOSIS — R4789 Other speech disturbances: Secondary | ICD-10-CM | POA: Diagnosis not present

## 2022-01-08 DIAGNOSIS — E538 Deficiency of other specified B group vitamins: Secondary | ICD-10-CM | POA: Diagnosis not present

## 2022-01-08 DIAGNOSIS — F0280 Dementia in other diseases classified elsewhere without behavioral disturbance: Secondary | ICD-10-CM | POA: Diagnosis not present

## 2022-01-08 DIAGNOSIS — M150 Primary generalized (osteo)arthritis: Secondary | ICD-10-CM | POA: Diagnosis not present

## 2022-01-08 DIAGNOSIS — Z9181 History of falling: Secondary | ICD-10-CM | POA: Diagnosis not present

## 2022-01-08 DIAGNOSIS — K219 Gastro-esophageal reflux disease without esophagitis: Secondary | ICD-10-CM | POA: Diagnosis not present

## 2022-01-08 DIAGNOSIS — R69 Illness, unspecified: Secondary | ICD-10-CM | POA: Diagnosis not present

## 2022-01-08 DIAGNOSIS — E0500 Thyrotoxicosis with diffuse goiter without thyrotoxic crisis or storm: Secondary | ICD-10-CM | POA: Diagnosis not present

## 2022-01-09 DIAGNOSIS — M150 Primary generalized (osteo)arthritis: Secondary | ICD-10-CM | POA: Diagnosis not present

## 2022-01-09 DIAGNOSIS — E538 Deficiency of other specified B group vitamins: Secondary | ICD-10-CM | POA: Diagnosis not present

## 2022-01-09 DIAGNOSIS — Z9181 History of falling: Secondary | ICD-10-CM | POA: Diagnosis not present

## 2022-01-09 DIAGNOSIS — R69 Illness, unspecified: Secondary | ICD-10-CM | POA: Diagnosis not present

## 2022-01-09 DIAGNOSIS — K219 Gastro-esophageal reflux disease without esophagitis: Secondary | ICD-10-CM | POA: Diagnosis not present

## 2022-01-09 DIAGNOSIS — R4789 Other speech disturbances: Secondary | ICD-10-CM | POA: Diagnosis not present

## 2022-01-09 DIAGNOSIS — E0500 Thyrotoxicosis with diffuse goiter without thyrotoxic crisis or storm: Secondary | ICD-10-CM | POA: Diagnosis not present

## 2022-01-09 DIAGNOSIS — F0280 Dementia in other diseases classified elsewhere without behavioral disturbance: Secondary | ICD-10-CM | POA: Diagnosis not present

## 2022-01-10 DIAGNOSIS — R4789 Other speech disturbances: Secondary | ICD-10-CM | POA: Diagnosis not present

## 2022-01-10 DIAGNOSIS — M150 Primary generalized (osteo)arthritis: Secondary | ICD-10-CM | POA: Diagnosis not present

## 2022-01-10 DIAGNOSIS — K219 Gastro-esophageal reflux disease without esophagitis: Secondary | ICD-10-CM | POA: Diagnosis not present

## 2022-01-10 DIAGNOSIS — Z9181 History of falling: Secondary | ICD-10-CM | POA: Diagnosis not present

## 2022-01-10 DIAGNOSIS — E538 Deficiency of other specified B group vitamins: Secondary | ICD-10-CM | POA: Diagnosis not present

## 2022-01-10 DIAGNOSIS — E0500 Thyrotoxicosis with diffuse goiter without thyrotoxic crisis or storm: Secondary | ICD-10-CM | POA: Diagnosis not present

## 2022-01-10 DIAGNOSIS — R69 Illness, unspecified: Secondary | ICD-10-CM | POA: Diagnosis not present

## 2022-01-10 DIAGNOSIS — F0280 Dementia in other diseases classified elsewhere without behavioral disturbance: Secondary | ICD-10-CM | POA: Diagnosis not present

## 2022-01-14 DIAGNOSIS — F0280 Dementia in other diseases classified elsewhere without behavioral disturbance: Secondary | ICD-10-CM | POA: Diagnosis not present

## 2022-01-14 DIAGNOSIS — M150 Primary generalized (osteo)arthritis: Secondary | ICD-10-CM | POA: Diagnosis not present

## 2022-01-14 DIAGNOSIS — R4789 Other speech disturbances: Secondary | ICD-10-CM | POA: Diagnosis not present

## 2022-01-14 DIAGNOSIS — K219 Gastro-esophageal reflux disease without esophagitis: Secondary | ICD-10-CM | POA: Diagnosis not present

## 2022-01-14 DIAGNOSIS — R69 Illness, unspecified: Secondary | ICD-10-CM | POA: Diagnosis not present

## 2022-01-14 DIAGNOSIS — E0500 Thyrotoxicosis with diffuse goiter without thyrotoxic crisis or storm: Secondary | ICD-10-CM | POA: Diagnosis not present

## 2022-01-14 DIAGNOSIS — Z9181 History of falling: Secondary | ICD-10-CM | POA: Diagnosis not present

## 2022-01-14 DIAGNOSIS — E538 Deficiency of other specified B group vitamins: Secondary | ICD-10-CM | POA: Diagnosis not present

## 2022-01-15 DIAGNOSIS — M150 Primary generalized (osteo)arthritis: Secondary | ICD-10-CM | POA: Diagnosis not present

## 2022-01-15 DIAGNOSIS — R4789 Other speech disturbances: Secondary | ICD-10-CM | POA: Diagnosis not present

## 2022-01-15 DIAGNOSIS — R69 Illness, unspecified: Secondary | ICD-10-CM | POA: Diagnosis not present

## 2022-01-15 DIAGNOSIS — K219 Gastro-esophageal reflux disease without esophagitis: Secondary | ICD-10-CM | POA: Diagnosis not present

## 2022-01-15 DIAGNOSIS — E538 Deficiency of other specified B group vitamins: Secondary | ICD-10-CM | POA: Diagnosis not present

## 2022-01-15 DIAGNOSIS — E0500 Thyrotoxicosis with diffuse goiter without thyrotoxic crisis or storm: Secondary | ICD-10-CM | POA: Diagnosis not present

## 2022-01-15 DIAGNOSIS — F0280 Dementia in other diseases classified elsewhere without behavioral disturbance: Secondary | ICD-10-CM | POA: Diagnosis not present

## 2022-01-15 DIAGNOSIS — Z9181 History of falling: Secondary | ICD-10-CM | POA: Diagnosis not present

## 2022-01-21 DIAGNOSIS — R69 Illness, unspecified: Secondary | ICD-10-CM | POA: Diagnosis not present

## 2022-01-21 DIAGNOSIS — F0280 Dementia in other diseases classified elsewhere without behavioral disturbance: Secondary | ICD-10-CM | POA: Diagnosis not present

## 2022-01-21 DIAGNOSIS — E0500 Thyrotoxicosis with diffuse goiter without thyrotoxic crisis or storm: Secondary | ICD-10-CM | POA: Diagnosis not present

## 2022-01-21 DIAGNOSIS — Z9181 History of falling: Secondary | ICD-10-CM | POA: Diagnosis not present

## 2022-01-21 DIAGNOSIS — K219 Gastro-esophageal reflux disease without esophagitis: Secondary | ICD-10-CM | POA: Diagnosis not present

## 2022-01-21 DIAGNOSIS — M150 Primary generalized (osteo)arthritis: Secondary | ICD-10-CM | POA: Diagnosis not present

## 2022-01-21 DIAGNOSIS — R4789 Other speech disturbances: Secondary | ICD-10-CM | POA: Diagnosis not present

## 2022-01-21 DIAGNOSIS — E538 Deficiency of other specified B group vitamins: Secondary | ICD-10-CM | POA: Diagnosis not present

## 2022-01-27 DIAGNOSIS — G20 Parkinson's disease: Secondary | ICD-10-CM | POA: Diagnosis not present

## 2022-01-30 DIAGNOSIS — F0280 Dementia in other diseases classified elsewhere without behavioral disturbance: Secondary | ICD-10-CM | POA: Diagnosis not present

## 2022-01-30 DIAGNOSIS — Z9181 History of falling: Secondary | ICD-10-CM | POA: Diagnosis not present

## 2022-01-30 DIAGNOSIS — K219 Gastro-esophageal reflux disease without esophagitis: Secondary | ICD-10-CM | POA: Diagnosis not present

## 2022-01-30 DIAGNOSIS — E538 Deficiency of other specified B group vitamins: Secondary | ICD-10-CM | POA: Diagnosis not present

## 2022-01-30 DIAGNOSIS — M150 Primary generalized (osteo)arthritis: Secondary | ICD-10-CM | POA: Diagnosis not present

## 2022-01-30 DIAGNOSIS — E0500 Thyrotoxicosis with diffuse goiter without thyrotoxic crisis or storm: Secondary | ICD-10-CM | POA: Diagnosis not present

## 2022-01-30 DIAGNOSIS — R69 Illness, unspecified: Secondary | ICD-10-CM | POA: Diagnosis not present

## 2022-01-30 DIAGNOSIS — R4789 Other speech disturbances: Secondary | ICD-10-CM | POA: Diagnosis not present

## 2022-02-04 DIAGNOSIS — M150 Primary generalized (osteo)arthritis: Secondary | ICD-10-CM | POA: Diagnosis not present

## 2022-02-04 DIAGNOSIS — R4789 Other speech disturbances: Secondary | ICD-10-CM | POA: Diagnosis not present

## 2022-02-04 DIAGNOSIS — F0280 Dementia in other diseases classified elsewhere without behavioral disturbance: Secondary | ICD-10-CM | POA: Diagnosis not present

## 2022-02-04 DIAGNOSIS — R69 Illness, unspecified: Secondary | ICD-10-CM | POA: Diagnosis not present

## 2022-02-04 DIAGNOSIS — Z9181 History of falling: Secondary | ICD-10-CM | POA: Diagnosis not present

## 2022-02-04 DIAGNOSIS — E538 Deficiency of other specified B group vitamins: Secondary | ICD-10-CM | POA: Diagnosis not present

## 2022-02-04 DIAGNOSIS — K219 Gastro-esophageal reflux disease without esophagitis: Secondary | ICD-10-CM | POA: Diagnosis not present

## 2022-02-04 DIAGNOSIS — E0500 Thyrotoxicosis with diffuse goiter without thyrotoxic crisis or storm: Secondary | ICD-10-CM | POA: Diagnosis not present

## 2022-02-11 DIAGNOSIS — R69 Illness, unspecified: Secondary | ICD-10-CM | POA: Diagnosis not present

## 2022-02-11 DIAGNOSIS — Z9181 History of falling: Secondary | ICD-10-CM | POA: Diagnosis not present

## 2022-02-11 DIAGNOSIS — E0500 Thyrotoxicosis with diffuse goiter without thyrotoxic crisis or storm: Secondary | ICD-10-CM | POA: Diagnosis not present

## 2022-02-11 DIAGNOSIS — K219 Gastro-esophageal reflux disease without esophagitis: Secondary | ICD-10-CM | POA: Diagnosis not present

## 2022-02-11 DIAGNOSIS — M150 Primary generalized (osteo)arthritis: Secondary | ICD-10-CM | POA: Diagnosis not present

## 2022-02-11 DIAGNOSIS — E538 Deficiency of other specified B group vitamins: Secondary | ICD-10-CM | POA: Diagnosis not present

## 2022-02-11 DIAGNOSIS — R4789 Other speech disturbances: Secondary | ICD-10-CM | POA: Diagnosis not present

## 2022-02-11 DIAGNOSIS — F0280 Dementia in other diseases classified elsewhere without behavioral disturbance: Secondary | ICD-10-CM | POA: Diagnosis not present

## 2022-02-11 NOTE — Telephone Encounter (Signed)
Opened in error

## 2022-02-20 DIAGNOSIS — M150 Primary generalized (osteo)arthritis: Secondary | ICD-10-CM | POA: Diagnosis not present

## 2022-02-20 DIAGNOSIS — E538 Deficiency of other specified B group vitamins: Secondary | ICD-10-CM | POA: Diagnosis not present

## 2022-02-20 DIAGNOSIS — E0500 Thyrotoxicosis with diffuse goiter without thyrotoxic crisis or storm: Secondary | ICD-10-CM | POA: Diagnosis not present

## 2022-02-20 DIAGNOSIS — R4789 Other speech disturbances: Secondary | ICD-10-CM | POA: Diagnosis not present

## 2022-02-20 DIAGNOSIS — F0280 Dementia in other diseases classified elsewhere without behavioral disturbance: Secondary | ICD-10-CM | POA: Diagnosis not present

## 2022-02-20 DIAGNOSIS — K219 Gastro-esophageal reflux disease without esophagitis: Secondary | ICD-10-CM | POA: Diagnosis not present

## 2022-02-20 DIAGNOSIS — R69 Illness, unspecified: Secondary | ICD-10-CM | POA: Diagnosis not present

## 2022-02-20 DIAGNOSIS — Z9181 History of falling: Secondary | ICD-10-CM | POA: Diagnosis not present

## 2022-02-24 DIAGNOSIS — M150 Primary generalized (osteo)arthritis: Secondary | ICD-10-CM | POA: Diagnosis not present

## 2022-02-24 DIAGNOSIS — Z9181 History of falling: Secondary | ICD-10-CM | POA: Diagnosis not present

## 2022-02-24 DIAGNOSIS — R69 Illness, unspecified: Secondary | ICD-10-CM | POA: Diagnosis not present

## 2022-02-24 DIAGNOSIS — K219 Gastro-esophageal reflux disease without esophagitis: Secondary | ICD-10-CM | POA: Diagnosis not present

## 2022-02-24 DIAGNOSIS — F0280 Dementia in other diseases classified elsewhere without behavioral disturbance: Secondary | ICD-10-CM | POA: Diagnosis not present

## 2022-02-24 DIAGNOSIS — R1310 Dysphagia, unspecified: Secondary | ICD-10-CM | POA: Diagnosis not present

## 2022-02-24 DIAGNOSIS — E538 Deficiency of other specified B group vitamins: Secondary | ICD-10-CM | POA: Diagnosis not present

## 2022-02-24 DIAGNOSIS — E0500 Thyrotoxicosis with diffuse goiter without thyrotoxic crisis or storm: Secondary | ICD-10-CM | POA: Diagnosis not present

## 2022-02-27 DIAGNOSIS — K219 Gastro-esophageal reflux disease without esophagitis: Secondary | ICD-10-CM | POA: Diagnosis not present

## 2022-02-27 DIAGNOSIS — R1310 Dysphagia, unspecified: Secondary | ICD-10-CM | POA: Diagnosis not present

## 2022-02-27 DIAGNOSIS — F0280 Dementia in other diseases classified elsewhere without behavioral disturbance: Secondary | ICD-10-CM | POA: Diagnosis not present

## 2022-02-27 DIAGNOSIS — Z9181 History of falling: Secondary | ICD-10-CM | POA: Diagnosis not present

## 2022-02-27 DIAGNOSIS — R69 Illness, unspecified: Secondary | ICD-10-CM | POA: Diagnosis not present

## 2022-02-27 DIAGNOSIS — E0500 Thyrotoxicosis with diffuse goiter without thyrotoxic crisis or storm: Secondary | ICD-10-CM | POA: Diagnosis not present

## 2022-02-27 DIAGNOSIS — R413 Other amnesia: Secondary | ICD-10-CM | POA: Diagnosis not present

## 2022-02-27 DIAGNOSIS — E538 Deficiency of other specified B group vitamins: Secondary | ICD-10-CM | POA: Diagnosis not present

## 2022-02-27 DIAGNOSIS — M150 Primary generalized (osteo)arthritis: Secondary | ICD-10-CM | POA: Diagnosis not present

## 2022-03-03 DIAGNOSIS — R1310 Dysphagia, unspecified: Secondary | ICD-10-CM | POA: Diagnosis not present

## 2022-03-03 DIAGNOSIS — Z9181 History of falling: Secondary | ICD-10-CM | POA: Diagnosis not present

## 2022-03-03 DIAGNOSIS — R69 Illness, unspecified: Secondary | ICD-10-CM | POA: Diagnosis not present

## 2022-03-03 DIAGNOSIS — F0280 Dementia in other diseases classified elsewhere without behavioral disturbance: Secondary | ICD-10-CM | POA: Diagnosis not present

## 2022-03-03 DIAGNOSIS — E538 Deficiency of other specified B group vitamins: Secondary | ICD-10-CM | POA: Diagnosis not present

## 2022-03-03 DIAGNOSIS — E0500 Thyrotoxicosis with diffuse goiter without thyrotoxic crisis or storm: Secondary | ICD-10-CM | POA: Diagnosis not present

## 2022-03-03 DIAGNOSIS — K219 Gastro-esophageal reflux disease without esophagitis: Secondary | ICD-10-CM | POA: Diagnosis not present

## 2022-03-03 DIAGNOSIS — M150 Primary generalized (osteo)arthritis: Secondary | ICD-10-CM | POA: Diagnosis not present

## 2022-03-05 DIAGNOSIS — M150 Primary generalized (osteo)arthritis: Secondary | ICD-10-CM | POA: Diagnosis not present

## 2022-03-05 DIAGNOSIS — E0500 Thyrotoxicosis with diffuse goiter without thyrotoxic crisis or storm: Secondary | ICD-10-CM | POA: Diagnosis not present

## 2022-03-05 DIAGNOSIS — Z9181 History of falling: Secondary | ICD-10-CM | POA: Diagnosis not present

## 2022-03-05 DIAGNOSIS — R1310 Dysphagia, unspecified: Secondary | ICD-10-CM | POA: Diagnosis not present

## 2022-03-05 DIAGNOSIS — E538 Deficiency of other specified B group vitamins: Secondary | ICD-10-CM | POA: Diagnosis not present

## 2022-03-05 DIAGNOSIS — F0280 Dementia in other diseases classified elsewhere without behavioral disturbance: Secondary | ICD-10-CM | POA: Diagnosis not present

## 2022-03-05 DIAGNOSIS — R69 Illness, unspecified: Secondary | ICD-10-CM | POA: Diagnosis not present

## 2022-03-05 DIAGNOSIS — K219 Gastro-esophageal reflux disease without esophagitis: Secondary | ICD-10-CM | POA: Diagnosis not present

## 2022-03-12 DIAGNOSIS — R41841 Cognitive communication deficit: Secondary | ICD-10-CM | POA: Diagnosis not present

## 2022-03-12 DIAGNOSIS — R490 Dysphonia: Secondary | ICD-10-CM | POA: Diagnosis not present

## 2022-03-18 DIAGNOSIS — R490 Dysphonia: Secondary | ICD-10-CM | POA: Diagnosis not present

## 2022-03-18 DIAGNOSIS — R41841 Cognitive communication deficit: Secondary | ICD-10-CM | POA: Diagnosis not present

## 2022-03-19 DIAGNOSIS — R41841 Cognitive communication deficit: Secondary | ICD-10-CM | POA: Diagnosis not present

## 2022-03-19 DIAGNOSIS — R490 Dysphonia: Secondary | ICD-10-CM | POA: Diagnosis not present

## 2022-03-20 DIAGNOSIS — R490 Dysphonia: Secondary | ICD-10-CM | POA: Diagnosis not present

## 2022-03-20 DIAGNOSIS — R41841 Cognitive communication deficit: Secondary | ICD-10-CM | POA: Diagnosis not present

## 2022-03-25 DIAGNOSIS — R41841 Cognitive communication deficit: Secondary | ICD-10-CM | POA: Diagnosis not present

## 2022-03-25 DIAGNOSIS — R490 Dysphonia: Secondary | ICD-10-CM | POA: Diagnosis not present

## 2022-03-26 DIAGNOSIS — R41841 Cognitive communication deficit: Secondary | ICD-10-CM | POA: Diagnosis not present

## 2022-03-26 DIAGNOSIS — R490 Dysphonia: Secondary | ICD-10-CM | POA: Diagnosis not present

## 2022-04-01 DIAGNOSIS — R41841 Cognitive communication deficit: Secondary | ICD-10-CM | POA: Diagnosis not present

## 2022-04-01 DIAGNOSIS — R490 Dysphonia: Secondary | ICD-10-CM | POA: Diagnosis not present

## 2022-04-02 DIAGNOSIS — R490 Dysphonia: Secondary | ICD-10-CM | POA: Diagnosis not present

## 2022-04-02 DIAGNOSIS — R41841 Cognitive communication deficit: Secondary | ICD-10-CM | POA: Diagnosis not present

## 2022-04-09 DIAGNOSIS — R41841 Cognitive communication deficit: Secondary | ICD-10-CM | POA: Diagnosis not present

## 2022-04-09 DIAGNOSIS — R490 Dysphonia: Secondary | ICD-10-CM | POA: Diagnosis not present

## 2022-04-10 DIAGNOSIS — R41841 Cognitive communication deficit: Secondary | ICD-10-CM | POA: Diagnosis not present

## 2022-04-10 DIAGNOSIS — R490 Dysphonia: Secondary | ICD-10-CM | POA: Diagnosis not present

## 2022-04-11 ENCOUNTER — Other Ambulatory Visit: Payer: Self-pay | Admitting: Physician Assistant

## 2022-04-11 ENCOUNTER — Ambulatory Visit
Admission: RE | Admit: 2022-04-11 | Discharge: 2022-04-11 | Disposition: A | Discharge status: Home / Self Care | Payer: Medicare HMO | Source: Ambulatory Visit | Attending: Obstetrics & Gynecology | Admitting: Obstetrics & Gynecology

## 2022-04-11 DIAGNOSIS — R921 Mammographic calcification found on diagnostic imaging of breast: Secondary | ICD-10-CM

## 2022-04-15 DIAGNOSIS — R41841 Cognitive communication deficit: Secondary | ICD-10-CM | POA: Diagnosis not present

## 2022-04-15 DIAGNOSIS — R490 Dysphonia: Secondary | ICD-10-CM | POA: Diagnosis not present

## 2022-04-16 DIAGNOSIS — R490 Dysphonia: Secondary | ICD-10-CM | POA: Diagnosis not present

## 2022-04-16 DIAGNOSIS — R41841 Cognitive communication deficit: Secondary | ICD-10-CM | POA: Diagnosis not present

## 2022-04-17 DIAGNOSIS — R490 Dysphonia: Secondary | ICD-10-CM | POA: Diagnosis not present

## 2022-04-17 DIAGNOSIS — R41841 Cognitive communication deficit: Secondary | ICD-10-CM | POA: Diagnosis not present

## 2022-04-22 DIAGNOSIS — R490 Dysphonia: Secondary | ICD-10-CM | POA: Diagnosis not present

## 2022-04-22 DIAGNOSIS — R41841 Cognitive communication deficit: Secondary | ICD-10-CM | POA: Diagnosis not present

## 2022-04-23 DIAGNOSIS — R490 Dysphonia: Secondary | ICD-10-CM | POA: Diagnosis not present

## 2022-04-23 DIAGNOSIS — R41841 Cognitive communication deficit: Secondary | ICD-10-CM | POA: Diagnosis not present

## 2022-04-29 DIAGNOSIS — R41841 Cognitive communication deficit: Secondary | ICD-10-CM | POA: Diagnosis not present

## 2022-04-29 DIAGNOSIS — R490 Dysphonia: Secondary | ICD-10-CM | POA: Diagnosis not present

## 2022-04-30 DIAGNOSIS — R490 Dysphonia: Secondary | ICD-10-CM | POA: Diagnosis not present

## 2022-04-30 DIAGNOSIS — R41841 Cognitive communication deficit: Secondary | ICD-10-CM | POA: Diagnosis not present

## 2022-05-01 DIAGNOSIS — I10 Essential (primary) hypertension: Secondary | ICD-10-CM | POA: Diagnosis not present

## 2022-05-01 DIAGNOSIS — F0393 Unspecified dementia, unspecified severity, with mood disturbance: Secondary | ICD-10-CM | POA: Diagnosis not present

## 2022-05-01 DIAGNOSIS — R41841 Cognitive communication deficit: Secondary | ICD-10-CM | POA: Diagnosis not present

## 2022-05-01 DIAGNOSIS — R69 Illness, unspecified: Secondary | ICD-10-CM | POA: Diagnosis not present

## 2022-05-01 DIAGNOSIS — R490 Dysphonia: Secondary | ICD-10-CM | POA: Diagnosis not present

## 2022-05-01 DIAGNOSIS — F0280 Dementia in other diseases classified elsewhere without behavioral disturbance: Secondary | ICD-10-CM | POA: Diagnosis not present

## 2022-05-01 DIAGNOSIS — G20 Parkinson's disease: Secondary | ICD-10-CM | POA: Diagnosis not present

## 2022-05-01 DIAGNOSIS — E039 Hypothyroidism, unspecified: Secondary | ICD-10-CM | POA: Diagnosis not present

## 2022-05-06 DIAGNOSIS — R41841 Cognitive communication deficit: Secondary | ICD-10-CM | POA: Diagnosis not present

## 2022-05-06 DIAGNOSIS — R490 Dysphonia: Secondary | ICD-10-CM | POA: Diagnosis not present

## 2022-05-07 DIAGNOSIS — R41841 Cognitive communication deficit: Secondary | ICD-10-CM | POA: Diagnosis not present

## 2022-05-07 DIAGNOSIS — R490 Dysphonia: Secondary | ICD-10-CM | POA: Diagnosis not present

## 2022-05-08 DIAGNOSIS — R41841 Cognitive communication deficit: Secondary | ICD-10-CM | POA: Diagnosis not present

## 2022-05-08 DIAGNOSIS — R490 Dysphonia: Secondary | ICD-10-CM | POA: Diagnosis not present

## 2022-05-13 DIAGNOSIS — R490 Dysphonia: Secondary | ICD-10-CM | POA: Diagnosis not present

## 2022-05-13 DIAGNOSIS — R41841 Cognitive communication deficit: Secondary | ICD-10-CM | POA: Diagnosis not present

## 2022-05-14 DIAGNOSIS — R490 Dysphonia: Secondary | ICD-10-CM | POA: Diagnosis not present

## 2022-05-14 DIAGNOSIS — R41841 Cognitive communication deficit: Secondary | ICD-10-CM | POA: Diagnosis not present

## 2022-05-15 DIAGNOSIS — R41841 Cognitive communication deficit: Secondary | ICD-10-CM | POA: Diagnosis not present

## 2022-05-15 DIAGNOSIS — R490 Dysphonia: Secondary | ICD-10-CM | POA: Diagnosis not present

## 2022-05-15 DIAGNOSIS — E039 Hypothyroidism, unspecified: Secondary | ICD-10-CM | POA: Diagnosis not present

## 2022-05-22 DIAGNOSIS — R41841 Cognitive communication deficit: Secondary | ICD-10-CM | POA: Diagnosis not present

## 2022-05-22 DIAGNOSIS — R490 Dysphonia: Secondary | ICD-10-CM | POA: Diagnosis not present

## 2022-05-27 DIAGNOSIS — R41841 Cognitive communication deficit: Secondary | ICD-10-CM | POA: Diagnosis not present

## 2022-05-27 DIAGNOSIS — R490 Dysphonia: Secondary | ICD-10-CM | POA: Diagnosis not present

## 2022-05-29 DIAGNOSIS — R41841 Cognitive communication deficit: Secondary | ICD-10-CM | POA: Diagnosis not present

## 2022-05-29 DIAGNOSIS — R490 Dysphonia: Secondary | ICD-10-CM | POA: Diagnosis not present

## 2022-06-03 DIAGNOSIS — R490 Dysphonia: Secondary | ICD-10-CM | POA: Diagnosis not present

## 2022-06-03 DIAGNOSIS — R41841 Cognitive communication deficit: Secondary | ICD-10-CM | POA: Diagnosis not present

## 2022-06-04 DIAGNOSIS — R490 Dysphonia: Secondary | ICD-10-CM | POA: Diagnosis not present

## 2022-06-04 DIAGNOSIS — R41841 Cognitive communication deficit: Secondary | ICD-10-CM | POA: Diagnosis not present

## 2022-06-05 DIAGNOSIS — R41841 Cognitive communication deficit: Secondary | ICD-10-CM | POA: Diagnosis not present

## 2022-06-05 DIAGNOSIS — R490 Dysphonia: Secondary | ICD-10-CM | POA: Diagnosis not present

## 2022-06-11 DIAGNOSIS — R41841 Cognitive communication deficit: Secondary | ICD-10-CM | POA: Diagnosis not present

## 2022-06-11 DIAGNOSIS — R490 Dysphonia: Secondary | ICD-10-CM | POA: Diagnosis not present

## 2022-06-18 DIAGNOSIS — R490 Dysphonia: Secondary | ICD-10-CM | POA: Diagnosis not present

## 2022-06-18 DIAGNOSIS — R41841 Cognitive communication deficit: Secondary | ICD-10-CM | POA: Diagnosis not present

## 2022-06-19 DIAGNOSIS — R41841 Cognitive communication deficit: Secondary | ICD-10-CM | POA: Diagnosis not present

## 2022-06-19 DIAGNOSIS — R490 Dysphonia: Secondary | ICD-10-CM | POA: Diagnosis not present

## 2022-07-01 DIAGNOSIS — H26493 Other secondary cataract, bilateral: Secondary | ICD-10-CM | POA: Diagnosis not present

## 2022-07-01 DIAGNOSIS — H52203 Unspecified astigmatism, bilateral: Secondary | ICD-10-CM | POA: Diagnosis not present

## 2022-07-01 DIAGNOSIS — R41841 Cognitive communication deficit: Secondary | ICD-10-CM | POA: Diagnosis not present

## 2022-07-01 DIAGNOSIS — H43813 Vitreous degeneration, bilateral: Secondary | ICD-10-CM | POA: Diagnosis not present

## 2022-07-01 DIAGNOSIS — H532 Diplopia: Secondary | ICD-10-CM | POA: Diagnosis not present

## 2022-07-01 DIAGNOSIS — R490 Dysphonia: Secondary | ICD-10-CM | POA: Diagnosis not present

## 2022-07-03 DIAGNOSIS — R490 Dysphonia: Secondary | ICD-10-CM | POA: Diagnosis not present

## 2022-07-03 DIAGNOSIS — R41841 Cognitive communication deficit: Secondary | ICD-10-CM | POA: Diagnosis not present

## 2022-07-08 DIAGNOSIS — R41841 Cognitive communication deficit: Secondary | ICD-10-CM | POA: Diagnosis not present

## 2022-07-08 DIAGNOSIS — R490 Dysphonia: Secondary | ICD-10-CM | POA: Diagnosis not present

## 2022-07-10 DIAGNOSIS — R41841 Cognitive communication deficit: Secondary | ICD-10-CM | POA: Diagnosis not present

## 2022-07-10 DIAGNOSIS — R490 Dysphonia: Secondary | ICD-10-CM | POA: Diagnosis not present

## 2022-07-15 DIAGNOSIS — R490 Dysphonia: Secondary | ICD-10-CM | POA: Diagnosis not present

## 2022-07-15 DIAGNOSIS — R41841 Cognitive communication deficit: Secondary | ICD-10-CM | POA: Diagnosis not present

## 2022-07-16 DIAGNOSIS — R413 Other amnesia: Secondary | ICD-10-CM | POA: Diagnosis not present

## 2022-07-17 DIAGNOSIS — R41841 Cognitive communication deficit: Secondary | ICD-10-CM | POA: Diagnosis not present

## 2022-07-17 DIAGNOSIS — R490 Dysphonia: Secondary | ICD-10-CM | POA: Diagnosis not present

## 2022-07-18 ENCOUNTER — Other Ambulatory Visit: Payer: Self-pay | Admitting: Nurse Practitioner

## 2022-07-18 DIAGNOSIS — N2889 Other specified disorders of kidney and ureter: Secondary | ICD-10-CM

## 2022-07-22 DIAGNOSIS — R490 Dysphonia: Secondary | ICD-10-CM | POA: Diagnosis not present

## 2022-07-22 DIAGNOSIS — I10 Essential (primary) hypertension: Secondary | ICD-10-CM | POA: Diagnosis not present

## 2022-07-22 DIAGNOSIS — R41841 Cognitive communication deficit: Secondary | ICD-10-CM | POA: Diagnosis not present

## 2022-07-23 DIAGNOSIS — R490 Dysphonia: Secondary | ICD-10-CM | POA: Diagnosis not present

## 2022-07-23 DIAGNOSIS — R41841 Cognitive communication deficit: Secondary | ICD-10-CM | POA: Diagnosis not present

## 2022-07-24 DIAGNOSIS — R41841 Cognitive communication deficit: Secondary | ICD-10-CM | POA: Diagnosis not present

## 2022-07-24 DIAGNOSIS — R490 Dysphonia: Secondary | ICD-10-CM | POA: Diagnosis not present

## 2022-07-25 ENCOUNTER — Telehealth: Payer: Self-pay | Admitting: Physician Assistant

## 2022-07-25 NOTE — Telephone Encounter (Signed)
States has faxed over a script for a Ultra Violet Wand.  Would like a response to be faxed back to [phone].

## 2022-07-29 DIAGNOSIS — R41841 Cognitive communication deficit: Secondary | ICD-10-CM | POA: Diagnosis not present

## 2022-07-29 DIAGNOSIS — R490 Dysphonia: Secondary | ICD-10-CM | POA: Diagnosis not present

## 2022-07-30 DIAGNOSIS — R490 Dysphonia: Secondary | ICD-10-CM | POA: Diagnosis not present

## 2022-07-30 DIAGNOSIS — R41841 Cognitive communication deficit: Secondary | ICD-10-CM | POA: Diagnosis not present

## 2022-07-30 NOTE — Telephone Encounter (Signed)
Please see msg and advise.  

## 2022-07-31 NOTE — Telephone Encounter (Signed)
Spouse called back.  States it is a P[employer] manager.  States he will call [facility] back tomorrow.

## 2022-07-31 NOTE — Telephone Encounter (Signed)
Spoke with patient husband and verified this is nothing he is aware of, thinks it could possibly be a scam of some sort. Will discuss with patient in home health care to make sure and call the office back tomorrow just to let us know for sure.

## 2022-08-06 DIAGNOSIS — R490 Dysphonia: Secondary | ICD-10-CM | POA: Diagnosis not present

## 2022-08-06 DIAGNOSIS — R41841 Cognitive communication deficit: Secondary | ICD-10-CM | POA: Diagnosis not present

## 2022-08-07 ENCOUNTER — Ambulatory Visit: Payer: Medicare HMO

## 2022-08-07 DIAGNOSIS — R41841 Cognitive communication deficit: Secondary | ICD-10-CM | POA: Diagnosis not present

## 2022-08-07 DIAGNOSIS — R490 Dysphonia: Secondary | ICD-10-CM | POA: Diagnosis not present

## 2022-08-14 ENCOUNTER — Ambulatory Visit
Admission: RE | Admit: 2022-08-14 | Discharge: 2022-08-14 | Disposition: A | Discharge status: Home / Self Care | Payer: Medicare HMO | Source: Ambulatory Visit | Attending: Nurse Practitioner | Admitting: Nurse Practitioner

## 2022-08-14 DIAGNOSIS — N2889 Other specified disorders of kidney and ureter: Secondary | ICD-10-CM

## 2022-08-14 DIAGNOSIS — N281 Cyst of kidney, acquired: Secondary | ICD-10-CM | POA: Diagnosis not present

## 2022-08-14 DIAGNOSIS — K8020 Calculus of gallbladder without cholecystitis without obstruction: Secondary | ICD-10-CM | POA: Diagnosis not present

## 2022-08-14 DIAGNOSIS — Z85528 Personal history of other malignant neoplasm of kidney: Secondary | ICD-10-CM | POA: Diagnosis not present

## 2022-08-14 MED ORDER — IOPAMIDOL (ISOVUE-300) INJECTION 61%
100.0000 mL | Freq: Once | INTRAVENOUS | Status: AC | PRN
  Administered 2022-08-14: 100 mL via INTRAVENOUS

## 2022-08-20 DIAGNOSIS — N2889 Other specified disorders of kidney and ureter: Secondary | ICD-10-CM | POA: Diagnosis not present

## 2022-09-02 DIAGNOSIS — R41841 Cognitive communication deficit: Secondary | ICD-10-CM | POA: Diagnosis not present

## 2022-09-02 DIAGNOSIS — E039 Hypothyroidism, unspecified: Secondary | ICD-10-CM | POA: Diagnosis not present

## 2022-09-02 DIAGNOSIS — R490 Dysphonia: Secondary | ICD-10-CM | POA: Diagnosis not present

## 2022-09-03 DIAGNOSIS — R490 Dysphonia: Secondary | ICD-10-CM | POA: Diagnosis not present

## 2022-09-03 DIAGNOSIS — R41841 Cognitive communication deficit: Secondary | ICD-10-CM | POA: Diagnosis not present

## 2022-09-04 DIAGNOSIS — R41841 Cognitive communication deficit: Secondary | ICD-10-CM | POA: Diagnosis not present

## 2022-09-04 DIAGNOSIS — R490 Dysphonia: Secondary | ICD-10-CM | POA: Diagnosis not present

## 2022-09-09 DIAGNOSIS — R490 Dysphonia: Secondary | ICD-10-CM | POA: Diagnosis not present

## 2022-09-09 DIAGNOSIS — R41841 Cognitive communication deficit: Secondary | ICD-10-CM | POA: Diagnosis not present

## 2022-09-10 DIAGNOSIS — G308 Other Alzheimer's disease: Secondary | ICD-10-CM | POA: Diagnosis not present

## 2022-09-10 DIAGNOSIS — R41841 Cognitive communication deficit: Secondary | ICD-10-CM | POA: Diagnosis not present

## 2022-09-10 DIAGNOSIS — F02B0 Dementia in other diseases classified elsewhere, moderate, without behavioral disturbance, psychotic disturbance, mood disturbance, and anxiety: Secondary | ICD-10-CM | POA: Diagnosis not present

## 2022-09-10 DIAGNOSIS — R490 Dysphonia: Secondary | ICD-10-CM | POA: Diagnosis not present

## 2022-09-11 DIAGNOSIS — R41841 Cognitive communication deficit: Secondary | ICD-10-CM | POA: Diagnosis not present

## 2022-09-11 DIAGNOSIS — R490 Dysphonia: Secondary | ICD-10-CM | POA: Diagnosis not present

## 2022-09-15 ENCOUNTER — Encounter: Payer: Self-pay | Admitting: *Deleted

## 2022-09-17 DIAGNOSIS — R490 Dysphonia: Secondary | ICD-10-CM | POA: Diagnosis not present

## 2022-09-17 DIAGNOSIS — R41841 Cognitive communication deficit: Secondary | ICD-10-CM | POA: Diagnosis not present

## 2022-09-18 DIAGNOSIS — R41841 Cognitive communication deficit: Secondary | ICD-10-CM | POA: Diagnosis not present

## 2022-09-18 DIAGNOSIS — R490 Dysphonia: Secondary | ICD-10-CM | POA: Diagnosis not present

## 2022-09-24 DIAGNOSIS — R41841 Cognitive communication deficit: Secondary | ICD-10-CM | POA: Diagnosis not present

## 2022-09-24 DIAGNOSIS — R490 Dysphonia: Secondary | ICD-10-CM | POA: Diagnosis not present

## 2022-09-25 DIAGNOSIS — R490 Dysphonia: Secondary | ICD-10-CM | POA: Diagnosis not present

## 2022-09-25 DIAGNOSIS — R41841 Cognitive communication deficit: Secondary | ICD-10-CM | POA: Diagnosis not present

## 2022-10-01 DIAGNOSIS — R41841 Cognitive communication deficit: Secondary | ICD-10-CM | POA: Diagnosis not present

## 2022-10-01 DIAGNOSIS — R490 Dysphonia: Secondary | ICD-10-CM | POA: Diagnosis not present

## 2022-10-02 DIAGNOSIS — R490 Dysphonia: Secondary | ICD-10-CM | POA: Diagnosis not present

## 2022-10-02 DIAGNOSIS — R41841 Cognitive communication deficit: Secondary | ICD-10-CM | POA: Diagnosis not present

## 2022-10-10 DIAGNOSIS — R490 Dysphonia: Secondary | ICD-10-CM | POA: Diagnosis not present

## 2022-10-10 DIAGNOSIS — R41841 Cognitive communication deficit: Secondary | ICD-10-CM | POA: Diagnosis not present

## 2022-10-14 DIAGNOSIS — R490 Dysphonia: Secondary | ICD-10-CM | POA: Diagnosis not present

## 2022-10-14 DIAGNOSIS — R41841 Cognitive communication deficit: Secondary | ICD-10-CM | POA: Diagnosis not present

## 2022-10-16 DIAGNOSIS — R41841 Cognitive communication deficit: Secondary | ICD-10-CM | POA: Diagnosis not present

## 2022-10-16 DIAGNOSIS — R490 Dysphonia: Secondary | ICD-10-CM | POA: Diagnosis not present

## 2022-10-21 DIAGNOSIS — R490 Dysphonia: Secondary | ICD-10-CM | POA: Diagnosis not present

## 2022-10-21 DIAGNOSIS — R41841 Cognitive communication deficit: Secondary | ICD-10-CM | POA: Diagnosis not present

## 2022-10-22 DIAGNOSIS — R490 Dysphonia: Secondary | ICD-10-CM | POA: Diagnosis not present

## 2022-10-22 DIAGNOSIS — R41841 Cognitive communication deficit: Secondary | ICD-10-CM | POA: Diagnosis not present

## 2022-10-23 DIAGNOSIS — R41841 Cognitive communication deficit: Secondary | ICD-10-CM | POA: Diagnosis not present

## 2022-10-23 DIAGNOSIS — R490 Dysphonia: Secondary | ICD-10-CM | POA: Diagnosis not present

## 2022-10-28 DIAGNOSIS — R490 Dysphonia: Secondary | ICD-10-CM | POA: Diagnosis not present

## 2022-10-28 DIAGNOSIS — R41841 Cognitive communication deficit: Secondary | ICD-10-CM | POA: Diagnosis not present

## 2022-10-29 DIAGNOSIS — R490 Dysphonia: Secondary | ICD-10-CM | POA: Diagnosis not present

## 2022-10-29 DIAGNOSIS — R41841 Cognitive communication deficit: Secondary | ICD-10-CM | POA: Diagnosis not present

## 2022-11-04 DIAGNOSIS — R490 Dysphonia: Secondary | ICD-10-CM | POA: Diagnosis not present

## 2022-11-04 DIAGNOSIS — R41841 Cognitive communication deficit: Secondary | ICD-10-CM | POA: Diagnosis not present

## 2022-11-05 DIAGNOSIS — R41841 Cognitive communication deficit: Secondary | ICD-10-CM | POA: Diagnosis not present

## 2022-11-05 DIAGNOSIS — R490 Dysphonia: Secondary | ICD-10-CM | POA: Diagnosis not present

## 2022-11-11 DIAGNOSIS — R490 Dysphonia: Secondary | ICD-10-CM | POA: Diagnosis not present

## 2022-11-11 DIAGNOSIS — R41841 Cognitive communication deficit: Secondary | ICD-10-CM | POA: Diagnosis not present

## 2022-11-12 DIAGNOSIS — R490 Dysphonia: Secondary | ICD-10-CM | POA: Diagnosis not present

## 2022-11-12 DIAGNOSIS — R41841 Cognitive communication deficit: Secondary | ICD-10-CM | POA: Diagnosis not present

## 2022-11-18 DIAGNOSIS — R41841 Cognitive communication deficit: Secondary | ICD-10-CM | POA: Diagnosis not present

## 2022-11-18 DIAGNOSIS — R490 Dysphonia: Secondary | ICD-10-CM | POA: Diagnosis not present

## 2022-11-19 DIAGNOSIS — R490 Dysphonia: Secondary | ICD-10-CM | POA: Diagnosis not present

## 2022-11-19 DIAGNOSIS — F02B0 Dementia in other diseases classified elsewhere, moderate, without behavioral disturbance, psychotic disturbance, mood disturbance, and anxiety: Secondary | ICD-10-CM | POA: Diagnosis not present

## 2022-11-19 DIAGNOSIS — G308 Other Alzheimer's disease: Secondary | ICD-10-CM | POA: Diagnosis not present

## 2022-11-19 DIAGNOSIS — R41841 Cognitive communication deficit: Secondary | ICD-10-CM | POA: Diagnosis not present

## 2022-11-20 DIAGNOSIS — R41841 Cognitive communication deficit: Secondary | ICD-10-CM | POA: Diagnosis not present

## 2022-11-20 DIAGNOSIS — R490 Dysphonia: Secondary | ICD-10-CM | POA: Diagnosis not present

## 2022-11-25 DIAGNOSIS — R41841 Cognitive communication deficit: Secondary | ICD-10-CM | POA: Diagnosis not present

## 2022-11-25 DIAGNOSIS — R490 Dysphonia: Secondary | ICD-10-CM | POA: Diagnosis not present

## 2022-11-26 DIAGNOSIS — R41841 Cognitive communication deficit: Secondary | ICD-10-CM | POA: Diagnosis not present

## 2022-11-26 DIAGNOSIS — R490 Dysphonia: Secondary | ICD-10-CM | POA: Diagnosis not present

## 2022-11-27 DIAGNOSIS — R41841 Cognitive communication deficit: Secondary | ICD-10-CM | POA: Diagnosis not present

## 2022-11-27 DIAGNOSIS — R490 Dysphonia: Secondary | ICD-10-CM | POA: Diagnosis not present

## 2022-12-02 DIAGNOSIS — R41841 Cognitive communication deficit: Secondary | ICD-10-CM | POA: Diagnosis not present

## 2022-12-02 DIAGNOSIS — R490 Dysphonia: Secondary | ICD-10-CM | POA: Diagnosis not present

## 2022-12-03 DIAGNOSIS — R41841 Cognitive communication deficit: Secondary | ICD-10-CM | POA: Diagnosis not present

## 2022-12-03 DIAGNOSIS — R490 Dysphonia: Secondary | ICD-10-CM | POA: Diagnosis not present

## 2022-12-04 ENCOUNTER — Encounter: Payer: Self-pay | Admitting: *Deleted

## 2022-12-04 DIAGNOSIS — R41841 Cognitive communication deficit: Secondary | ICD-10-CM | POA: Diagnosis not present

## 2022-12-04 DIAGNOSIS — R490 Dysphonia: Secondary | ICD-10-CM | POA: Diagnosis not present

## 2022-12-08 DIAGNOSIS — R41841 Cognitive communication deficit: Secondary | ICD-10-CM | POA: Diagnosis not present

## 2022-12-08 DIAGNOSIS — R490 Dysphonia: Secondary | ICD-10-CM | POA: Diagnosis not present

## 2022-12-09 DIAGNOSIS — R490 Dysphonia: Secondary | ICD-10-CM | POA: Diagnosis not present

## 2022-12-09 DIAGNOSIS — R41841 Cognitive communication deficit: Secondary | ICD-10-CM | POA: Diagnosis not present

## 2022-12-10 DIAGNOSIS — R490 Dysphonia: Secondary | ICD-10-CM | POA: Diagnosis not present

## 2022-12-10 DIAGNOSIS — R41841 Cognitive communication deficit: Secondary | ICD-10-CM | POA: Diagnosis not present

## 2022-12-11 DIAGNOSIS — R41841 Cognitive communication deficit: Secondary | ICD-10-CM | POA: Diagnosis not present

## 2022-12-11 DIAGNOSIS — R490 Dysphonia: Secondary | ICD-10-CM | POA: Diagnosis not present

## 2022-12-18 DIAGNOSIS — E039 Hypothyroidism, unspecified: Secondary | ICD-10-CM | POA: Diagnosis not present

## 2022-12-23 DIAGNOSIS — R41841 Cognitive communication deficit: Secondary | ICD-10-CM | POA: Diagnosis not present

## 2022-12-23 DIAGNOSIS — R490 Dysphonia: Secondary | ICD-10-CM | POA: Diagnosis not present

## 2022-12-24 DIAGNOSIS — R490 Dysphonia: Secondary | ICD-10-CM | POA: Diagnosis not present

## 2022-12-24 DIAGNOSIS — R41841 Cognitive communication deficit: Secondary | ICD-10-CM | POA: Diagnosis not present

## 2022-12-25 DIAGNOSIS — R41841 Cognitive communication deficit: Secondary | ICD-10-CM | POA: Diagnosis not present

## 2022-12-25 DIAGNOSIS — R490 Dysphonia: Secondary | ICD-10-CM | POA: Diagnosis not present

## 2022-12-30 DIAGNOSIS — R490 Dysphonia: Secondary | ICD-10-CM | POA: Diagnosis not present

## 2022-12-30 DIAGNOSIS — R41841 Cognitive communication deficit: Secondary | ICD-10-CM | POA: Diagnosis not present

## 2022-12-31 DIAGNOSIS — R41841 Cognitive communication deficit: Secondary | ICD-10-CM | POA: Diagnosis not present

## 2022-12-31 DIAGNOSIS — R490 Dysphonia: Secondary | ICD-10-CM | POA: Diagnosis not present

## 2023-01-01 DIAGNOSIS — R490 Dysphonia: Secondary | ICD-10-CM | POA: Diagnosis not present

## 2023-01-01 DIAGNOSIS — R41841 Cognitive communication deficit: Secondary | ICD-10-CM | POA: Diagnosis not present

## 2023-01-06 DIAGNOSIS — R41841 Cognitive communication deficit: Secondary | ICD-10-CM | POA: Diagnosis not present

## 2023-01-06 DIAGNOSIS — R490 Dysphonia: Secondary | ICD-10-CM | POA: Diagnosis not present

## 2023-01-07 DIAGNOSIS — R41841 Cognitive communication deficit: Secondary | ICD-10-CM | POA: Diagnosis not present

## 2023-01-07 DIAGNOSIS — R490 Dysphonia: Secondary | ICD-10-CM | POA: Diagnosis not present

## 2023-01-08 DIAGNOSIS — R490 Dysphonia: Secondary | ICD-10-CM | POA: Diagnosis not present

## 2023-01-08 DIAGNOSIS — R41841 Cognitive communication deficit: Secondary | ICD-10-CM | POA: Diagnosis not present

## 2023-01-13 DIAGNOSIS — R41841 Cognitive communication deficit: Secondary | ICD-10-CM | POA: Diagnosis not present

## 2023-01-13 DIAGNOSIS — R490 Dysphonia: Secondary | ICD-10-CM | POA: Diagnosis not present

## 2023-01-14 DIAGNOSIS — R41841 Cognitive communication deficit: Secondary | ICD-10-CM | POA: Diagnosis not present

## 2023-01-14 DIAGNOSIS — R490 Dysphonia: Secondary | ICD-10-CM | POA: Diagnosis not present

## 2023-01-15 DIAGNOSIS — R41841 Cognitive communication deficit: Secondary | ICD-10-CM | POA: Diagnosis not present

## 2023-01-15 DIAGNOSIS — R490 Dysphonia: Secondary | ICD-10-CM | POA: Diagnosis not present

## 2023-01-20 DIAGNOSIS — R41841 Cognitive communication deficit: Secondary | ICD-10-CM | POA: Diagnosis not present

## 2023-01-20 DIAGNOSIS — R490 Dysphonia: Secondary | ICD-10-CM | POA: Diagnosis not present

## 2023-01-21 DIAGNOSIS — R41841 Cognitive communication deficit: Secondary | ICD-10-CM | POA: Diagnosis not present

## 2023-01-21 DIAGNOSIS — R490 Dysphonia: Secondary | ICD-10-CM | POA: Diagnosis not present

## 2023-01-22 DIAGNOSIS — R490 Dysphonia: Secondary | ICD-10-CM | POA: Diagnosis not present

## 2023-01-22 DIAGNOSIS — R41841 Cognitive communication deficit: Secondary | ICD-10-CM | POA: Diagnosis not present

## 2023-01-27 DIAGNOSIS — Z8639 Personal history of other endocrine, nutritional and metabolic disease: Secondary | ICD-10-CM | POA: Diagnosis not present

## 2023-01-27 DIAGNOSIS — I10 Essential (primary) hypertension: Secondary | ICD-10-CM | POA: Diagnosis not present

## 2023-01-27 DIAGNOSIS — E890 Postprocedural hypothyroidism: Secondary | ICD-10-CM | POA: Diagnosis not present

## 2023-01-27 DIAGNOSIS — R69 Illness, unspecified: Secondary | ICD-10-CM | POA: Diagnosis not present

## 2023-01-27 DIAGNOSIS — R490 Dysphonia: Secondary | ICD-10-CM | POA: Diagnosis not present

## 2023-01-27 DIAGNOSIS — G309 Alzheimer's disease, unspecified: Secondary | ICD-10-CM | POA: Diagnosis not present

## 2023-01-27 DIAGNOSIS — Y92129 Unspecified place in nursing home as the place of occurrence of the external cause: Secondary | ICD-10-CM | POA: Diagnosis not present

## 2023-01-27 DIAGNOSIS — E039 Hypothyroidism, unspecified: Secondary | ICD-10-CM | POA: Diagnosis not present

## 2023-01-27 DIAGNOSIS — R41841 Cognitive communication deficit: Secondary | ICD-10-CM | POA: Diagnosis not present

## 2023-01-27 DIAGNOSIS — W19XXXA Unspecified fall, initial encounter: Secondary | ICD-10-CM | POA: Diagnosis not present

## 2023-01-28 DIAGNOSIS — R490 Dysphonia: Secondary | ICD-10-CM | POA: Diagnosis not present

## 2023-01-28 DIAGNOSIS — R41841 Cognitive communication deficit: Secondary | ICD-10-CM | POA: Diagnosis not present

## 2023-01-29 DIAGNOSIS — R490 Dysphonia: Secondary | ICD-10-CM | POA: Diagnosis not present

## 2023-01-29 DIAGNOSIS — R41841 Cognitive communication deficit: Secondary | ICD-10-CM | POA: Diagnosis not present

## 2023-02-04 DIAGNOSIS — R490 Dysphonia: Secondary | ICD-10-CM | POA: Diagnosis not present

## 2023-02-04 DIAGNOSIS — R41841 Cognitive communication deficit: Secondary | ICD-10-CM | POA: Diagnosis not present

## 2023-02-05 DIAGNOSIS — R490 Dysphonia: Secondary | ICD-10-CM | POA: Diagnosis not present

## 2023-02-05 DIAGNOSIS — R41841 Cognitive communication deficit: Secondary | ICD-10-CM | POA: Diagnosis not present

## 2023-02-10 DIAGNOSIS — R490 Dysphonia: Secondary | ICD-10-CM | POA: Diagnosis not present

## 2023-02-10 DIAGNOSIS — R41841 Cognitive communication deficit: Secondary | ICD-10-CM | POA: Diagnosis not present

## 2023-02-11 DIAGNOSIS — R490 Dysphonia: Secondary | ICD-10-CM | POA: Diagnosis not present

## 2023-02-11 DIAGNOSIS — R41841 Cognitive communication deficit: Secondary | ICD-10-CM | POA: Diagnosis not present

## 2023-02-12 DIAGNOSIS — R41841 Cognitive communication deficit: Secondary | ICD-10-CM | POA: Diagnosis not present

## 2023-02-12 DIAGNOSIS — R490 Dysphonia: Secondary | ICD-10-CM | POA: Diagnosis not present

## 2023-02-17 DIAGNOSIS — R490 Dysphonia: Secondary | ICD-10-CM | POA: Diagnosis not present

## 2023-02-17 DIAGNOSIS — R41841 Cognitive communication deficit: Secondary | ICD-10-CM | POA: Diagnosis not present

## 2023-02-18 DIAGNOSIS — R41841 Cognitive communication deficit: Secondary | ICD-10-CM | POA: Diagnosis not present

## 2023-02-18 DIAGNOSIS — R490 Dysphonia: Secondary | ICD-10-CM | POA: Diagnosis not present

## 2023-03-03 DIAGNOSIS — R41841 Cognitive communication deficit: Secondary | ICD-10-CM | POA: Diagnosis not present

## 2023-03-03 DIAGNOSIS — R490 Dysphonia: Secondary | ICD-10-CM | POA: Diagnosis not present

## 2023-03-12 DIAGNOSIS — G309 Alzheimer's disease, unspecified: Secondary | ICD-10-CM | POA: Diagnosis not present

## 2023-03-12 DIAGNOSIS — R69 Illness, unspecified: Secondary | ICD-10-CM | POA: Diagnosis not present

## 2023-03-12 DIAGNOSIS — R635 Abnormal weight gain: Secondary | ICD-10-CM | POA: Diagnosis not present

## 2023-03-12 DIAGNOSIS — G20A1 Parkinson's disease without dyskinesia, without mention of fluctuations: Secondary | ICD-10-CM | POA: Diagnosis not present

## 2023-03-17 DIAGNOSIS — R490 Dysphonia: Secondary | ICD-10-CM | POA: Diagnosis not present

## 2023-03-17 DIAGNOSIS — R41841 Cognitive communication deficit: Secondary | ICD-10-CM | POA: Diagnosis not present

## 2023-03-19 DIAGNOSIS — R41841 Cognitive communication deficit: Secondary | ICD-10-CM | POA: Diagnosis not present

## 2023-03-19 DIAGNOSIS — R490 Dysphonia: Secondary | ICD-10-CM | POA: Diagnosis not present

## 2023-03-24 DIAGNOSIS — R41841 Cognitive communication deficit: Secondary | ICD-10-CM | POA: Diagnosis not present

## 2023-03-24 DIAGNOSIS — R490 Dysphonia: Secondary | ICD-10-CM | POA: Diagnosis not present

## 2023-03-25 DIAGNOSIS — R490 Dysphonia: Secondary | ICD-10-CM | POA: Diagnosis not present

## 2023-03-25 DIAGNOSIS — R41841 Cognitive communication deficit: Secondary | ICD-10-CM | POA: Diagnosis not present

## 2023-03-26 DIAGNOSIS — R41841 Cognitive communication deficit: Secondary | ICD-10-CM | POA: Diagnosis not present

## 2023-03-26 DIAGNOSIS — R490 Dysphonia: Secondary | ICD-10-CM | POA: Diagnosis not present

## 2023-03-31 DIAGNOSIS — M25521 Pain in right elbow: Secondary | ICD-10-CM | POA: Diagnosis not present

## 2023-03-31 DIAGNOSIS — W19XXXA Unspecified fall, initial encounter: Secondary | ICD-10-CM | POA: Diagnosis not present

## 2023-04-01 DIAGNOSIS — R490 Dysphonia: Secondary | ICD-10-CM | POA: Diagnosis not present

## 2023-04-01 DIAGNOSIS — R41841 Cognitive communication deficit: Secondary | ICD-10-CM | POA: Diagnosis not present

## 2023-04-02 ENCOUNTER — Emergency Department (HOSPITAL_BASED_OUTPATIENT_CLINIC_OR_DEPARTMENT_OTHER): Payer: Medicare HMO | Admitting: Radiology

## 2023-04-02 ENCOUNTER — Emergency Department (HOSPITAL_BASED_OUTPATIENT_CLINIC_OR_DEPARTMENT_OTHER)
Admission: EM | Admit: 2023-04-02 | Discharge: 2023-04-02 | Disposition: A | Discharge status: Home / Self Care | Payer: Medicare HMO | Attending: Emergency Medicine | Admitting: Emergency Medicine

## 2023-04-02 ENCOUNTER — Other Ambulatory Visit: Payer: Self-pay

## 2023-04-02 ENCOUNTER — Encounter (HOSPITAL_BASED_OUTPATIENT_CLINIC_OR_DEPARTMENT_OTHER): Payer: Self-pay

## 2023-04-02 DIAGNOSIS — R69 Illness, unspecified: Secondary | ICD-10-CM | POA: Diagnosis not present

## 2023-04-02 DIAGNOSIS — S52124A Nondisplaced fracture of head of right radius, initial encounter for closed fracture: Secondary | ICD-10-CM | POA: Diagnosis not present

## 2023-04-02 DIAGNOSIS — G20C Parkinsonism, unspecified: Secondary | ICD-10-CM | POA: Insufficient documentation

## 2023-04-02 DIAGNOSIS — W19XXXA Unspecified fall, initial encounter: Secondary | ICD-10-CM | POA: Diagnosis not present

## 2023-04-02 DIAGNOSIS — F0390 Unspecified dementia without behavioral disturbance: Secondary | ICD-10-CM | POA: Diagnosis not present

## 2023-04-02 DIAGNOSIS — S52121A Displaced fracture of head of right radius, initial encounter for closed fracture: Secondary | ICD-10-CM | POA: Diagnosis not present

## 2023-04-02 DIAGNOSIS — S59901A Unspecified injury of right elbow, initial encounter: Secondary | ICD-10-CM | POA: Diagnosis not present

## 2023-04-02 NOTE — Discharge Instructions (Signed)
You have a small radial head fracture in your elbow.  You should wear the sling for the next 3 to 7 days.  Once you are no longer having any pain in your elbow you can start doing range of motion exercises.  Please follow-up with orthopedics to ensure this is healing well.

## 2023-04-02 NOTE — ED Provider Notes (Signed)
[name] Provider Note   CSN: [id number] Arrival date & time: 04/02/23  1507     History  Chief Complaint  Patient presents with   [name] is a 82 y.o. female.  TYEASE Martin is a 82 y.o. female with a history of Parkinson's and dementia, who presents to the ED for evaluation after a fall a few days ago.  Husband reports that they reside at [facility], an assisted living facility.  She was using her rollator when she fell forward scraping her nose and hitting her right elbow.  Husband reports she is occasionally complained of pain at the elbow.  He states that she reportedly had an x-ray done at the facility and they were unsure about a possible fracture and so she was sent to the ED today for further evaluation.  Patient did not lose consciousness and aside from the scrape on her nose denies any facial pain, has not had any facial bruising or swelling.  No difficulty breathing through her nose.  Denies headache or neck pain.  No other pain or injuries from fall.  Patient is not on blood thinners.  The history is provided by the patient and the spouse.  Fall       Home Medications Prior to Admission medications   Medication Sig Start Date End Date Taking? Authorizing Provider  Calcium 150 MG TABS 1 tab    [provider]  donepezil (ARICEPT) 5 MG tablet Take 1 tablet (5 mg total) by mouth daily. 12/24/21 03/24/22  Allwardt, Crist Infante, PA-C  levothyroxine (SYNTHROID) 75 MCG tablet Take 1 tablet (75 mcg total) by mouth daily before breakfast. 12/18/21   Allwardt, Alyssa M, PA-C  lisinopril (ZESTRIL) 10 MG tablet Take 1 tablet (10 mg total) by mouth daily. 12/18/21   Allwardt, Crist Infante, PA-C  Multiple Vitamin (MULTIVITAMIN WITH MINERALS) TABS tablet Take 1 tablet by mouth daily.    [provider]  pantoprazole (PROTONIX) 40 MG tablet Take 1 tablet (40 mg total) by mouth daily. 12/24/21 03/24/22   Allwardt, Crist Infante, PA-C  Potassium Citrate 99 MG CAPS Take 1 capsule by mouth daily.    [provider]      Allergies    Bactrim, Erythromycin, Sulfa antibiotics, Sulfamethoxazole-trimethoprim, Amoxicillin, Nitrofurantoin monohyd macro, Other, and Vicodin [hydrocodone-acetaminophen]    Review of Systems   Review of Systems  Unable to perform ROS: Dementia    Physical Exam Updated Vital Signs BP (!) 160/76 (BP Location: Left Arm)   Pulse 70   Temp 98.2 F (36.8 C) (Oral)   Resp 16   Ht 5\' 2"  (1.575 m)   Wt 66 kg   SpO2 96%   BMI 26.61 kg/m  Physical Exam Vitals and nursing note reviewed.  Constitutional:      General: She is not in acute distress.    Appearance: Normal appearance. She is well-developed and normal weight. She is not ill-appearing or diaphoretic.  HENT:     Head: Normocephalic and atraumatic.     Comments: No hematoma, step-off, deformity or bruising, negative Battle sign    Ears:     Comments: No CSF otorrhea    Nose:     Comments: Scab noted over the nose but there is no tenderness, deformity or swelling over the bridge of the nose, no septal hematoma noted or septal deviation    Mouth/Throat:     Mouth: Mucous membranes are moist.  Pharynx: Oropharynx is clear.     Comments: No tenderness or malocclusion of the jaw Eyes:     General:        Right eye: No discharge.        Left eye: No discharge.     Extraocular Movements: Extraocular movements intact.     Pupils: Pupils are equal, round, and reactive to light.     Comments: No periorbital tenderness  Pulmonary:     Effort: Pulmonary effort is normal. No respiratory distress.  Musculoskeletal:     Cervical back: Normal range of motion and neck supple. No tenderness.     Comments: Patient denies tenderness with palpation over the right elbow, there is a small bruise over the radial aspect of the elbow without effusion, patient able to fully bend and extend the elbow.  Distal pulses 2+,  normal strength with grip, thumbs up and okay sign. All other joints supple and easily movable, all compartments soft  Skin:    General: Skin is warm and dry.  Neurological:     Mental Status: She is alert and oriented to person, place, and time.     Coordination: Coordination normal.  Psychiatric:        Mood and Affect: Mood normal.        Behavior: Behavior normal.     ED Results / Procedures / Treatments   Labs (all labs ordered are listed, but only abnormal results are displayed) Labs Reviewed - No data to display  EKG None  Radiology DG Elbow Complete Right  Result Date: 04/02/2023 CLINICAL DATA:  Fall 2 Days ago EXAM: RIGHT ELBOW - COMPLETE 3+ VIEW COMPARISON:  None Available. FINDINGS: There is a minimally displaced radial head fracture. Small joint effusion. Mild degenerative changes of the elbow. IMPRESSION: Minimally displaced radial head fracture.  Small joint effusion. Electronically Signed   By: Caprice RenshawJacob  Kahn M.D.   On: 04/02/2023 16:30    Procedures Procedures    Medications Ordered in ED Medications - No data to display  ED Course/ Medical Decision Making/ A&P                             Medical Decision Making Amount and/or Complexity of Data Reviewed Radiology: ordered.   82 year old female presents after a mechanical fall that occurred a few days ago.  Has a scab to the nose but denies any loss of consciousness, no neurologic deficits or other signs of head injury.  No tenderness or deformity over the nose and no jaw tenderness or malocclusion.  Given that injury was a few days ago and she has had no worsening symptoms or neurologic signs do not feel that head or facial imaging is indicated.  There was question of a possible fracture to the right elbow.  X-rays completed here show a minimally displaced radial head fracture with small joint effusion.  Patient able to range the right elbow and denies pain at this time.  Patient placed in sling and encouraged  to wear for 3 to 7 days and then begin performing range of motion exercises.  Will provide follow-up with orthopedics, discussed Tylenol and ice as needed for any pain management.  Patient and husband are in agreement with plan.  Discharged home in good condition.        Final Clinical Impression(s) / ED Diagnoses Final diagnoses:  Closed nondisplaced fracture of head of right radius, initial encounter    Rx /  DC Orders ED Discharge Orders     None         Legrand Rams 04/02/23 1858    Loetta Rough, MD 04/02/23 [phone]

## 2023-04-02 NOTE — ED Notes (Signed)
Pt verbalized understanding of d/c instructions, meds, and followup care. Denies questions. VSS, no distress noted. Steady gait to exit with all belongings.  ?

## 2023-04-02 NOTE — ED Triage Notes (Addendum)
Patient here POV from [facility].  Endorses Fall 2 Days ago in which she lost her balance and fell onto the floor. Fell and injured her nose and had pain to her Right Elbow at the time (currently has no pain).   No LOC or other Pain. No Anticoagulants. Had an XRAY of the Rt Elbow (Which was suspicious for fracture) at the facility 2 Days ago but is unsure why they decided to seek ED Evaluation today.  NAD Noted during triage. Ambulatory with walker.

## 2023-04-07 ENCOUNTER — Telehealth: Payer: Self-pay | Admitting: *Deleted

## 2023-04-07 DIAGNOSIS — R490 Dysphonia: Secondary | ICD-10-CM | POA: Diagnosis not present

## 2023-04-07 DIAGNOSIS — R41841 Cognitive communication deficit: Secondary | ICD-10-CM | POA: Diagnosis not present

## 2023-04-07 NOTE — Telephone Encounter (Signed)
     Patient  visit on 04/02/2023  at [facility]  was for treatment  Have you been able to follow up with your primary care physician? patient to be followed up with dr husband can take to appts  The patient was able to obtain any needed medicine or equipment.  Are there diet recommendations that you are having difficulty following?NA  Patient expresses understanding of discharge instructions and education provided has no other needs at this time.  Na  [facility] -[name], [facility] [phone] [address] [place] , [place] [address] Email : [email or web address]

## 2023-04-08 DIAGNOSIS — R490 Dysphonia: Secondary | ICD-10-CM | POA: Diagnosis not present

## 2023-04-08 DIAGNOSIS — R41841 Cognitive communication deficit: Secondary | ICD-10-CM | POA: Diagnosis not present

## 2023-04-09 DIAGNOSIS — R490 Dysphonia: Secondary | ICD-10-CM | POA: Diagnosis not present

## 2023-04-09 DIAGNOSIS — R41841 Cognitive communication deficit: Secondary | ICD-10-CM | POA: Diagnosis not present

## 2023-04-15 DIAGNOSIS — W19XXXA Unspecified fall, initial encounter: Secondary | ICD-10-CM | POA: Diagnosis not present

## 2023-04-15 DIAGNOSIS — R490 Dysphonia: Secondary | ICD-10-CM | POA: Diagnosis not present

## 2023-04-15 DIAGNOSIS — E0500 Thyrotoxicosis with diffuse goiter without thyrotoxic crisis or storm: Secondary | ICD-10-CM | POA: Diagnosis not present

## 2023-04-15 DIAGNOSIS — Z7189 Other specified counseling: Secondary | ICD-10-CM | POA: Diagnosis not present

## 2023-04-15 DIAGNOSIS — R41841 Cognitive communication deficit: Secondary | ICD-10-CM | POA: Diagnosis not present

## 2023-04-15 DIAGNOSIS — G20A1 Parkinson's disease without dyskinesia, without mention of fluctuations: Secondary | ICD-10-CM | POA: Diagnosis not present

## 2023-04-15 DIAGNOSIS — S52121D Displaced fracture of head of right radius, subsequent encounter for closed fracture with routine healing: Secondary | ICD-10-CM | POA: Diagnosis not present

## 2023-04-21 DIAGNOSIS — R490 Dysphonia: Secondary | ICD-10-CM | POA: Diagnosis not present

## 2023-04-21 DIAGNOSIS — R41841 Cognitive communication deficit: Secondary | ICD-10-CM | POA: Diagnosis not present

## 2023-04-22 DIAGNOSIS — R490 Dysphonia: Secondary | ICD-10-CM | POA: Diagnosis not present

## 2023-04-22 DIAGNOSIS — R41841 Cognitive communication deficit: Secondary | ICD-10-CM | POA: Diagnosis not present

## 2023-04-28 DIAGNOSIS — R490 Dysphonia: Secondary | ICD-10-CM | POA: Diagnosis not present

## 2023-04-28 DIAGNOSIS — I10 Essential (primary) hypertension: Secondary | ICD-10-CM | POA: Diagnosis not present

## 2023-04-28 DIAGNOSIS — E039 Hypothyroidism, unspecified: Secondary | ICD-10-CM | POA: Diagnosis not present

## 2023-04-28 DIAGNOSIS — I2510 Atherosclerotic heart disease of native coronary artery without angina pectoris: Secondary | ICD-10-CM | POA: Diagnosis not present

## 2023-04-28 DIAGNOSIS — E569 Vitamin deficiency, unspecified: Secondary | ICD-10-CM | POA: Diagnosis not present

## 2023-04-28 DIAGNOSIS — R41841 Cognitive communication deficit: Secondary | ICD-10-CM | POA: Diagnosis not present

## 2023-04-28 DIAGNOSIS — E785 Hyperlipidemia, unspecified: Secondary | ICD-10-CM | POA: Diagnosis not present

## 2023-04-28 DIAGNOSIS — E559 Vitamin D deficiency, unspecified: Secondary | ICD-10-CM | POA: Diagnosis not present

## 2023-04-30 DIAGNOSIS — R41841 Cognitive communication deficit: Secondary | ICD-10-CM | POA: Diagnosis not present

## 2023-04-30 DIAGNOSIS — R490 Dysphonia: Secondary | ICD-10-CM | POA: Diagnosis not present

## 2023-05-05 DIAGNOSIS — R490 Dysphonia: Secondary | ICD-10-CM | POA: Diagnosis not present

## 2023-05-05 DIAGNOSIS — R41841 Cognitive communication deficit: Secondary | ICD-10-CM | POA: Diagnosis not present

## 2023-05-07 DIAGNOSIS — R490 Dysphonia: Secondary | ICD-10-CM | POA: Diagnosis not present

## 2023-05-07 DIAGNOSIS — R41841 Cognitive communication deficit: Secondary | ICD-10-CM | POA: Diagnosis not present

## 2023-05-13 DIAGNOSIS — R41841 Cognitive communication deficit: Secondary | ICD-10-CM | POA: Diagnosis not present

## 2023-05-13 DIAGNOSIS — R490 Dysphonia: Secondary | ICD-10-CM | POA: Diagnosis not present

## 2023-05-14 DIAGNOSIS — R41841 Cognitive communication deficit: Secondary | ICD-10-CM | POA: Diagnosis not present

## 2023-05-14 DIAGNOSIS — R490 Dysphonia: Secondary | ICD-10-CM | POA: Diagnosis not present

## 2023-05-19 DIAGNOSIS — R490 Dysphonia: Secondary | ICD-10-CM | POA: Diagnosis not present

## 2023-05-19 DIAGNOSIS — R41841 Cognitive communication deficit: Secondary | ICD-10-CM | POA: Diagnosis not present

## 2023-05-19 IMAGING — MG MM DIGITAL DIAGNOSTIC UNILAT*R* W/ TOMO W/ CAD
7 series · 8 of 15 positions shown · non-contrast
Comparison: Previous exam(s).

CLINICAL DATA: First six-month follow-up for probably benign RIGHT
breast calcifications.

EXAM:
DIGITAL DIAGNOSTIC UNILATERAL RIGHT MAMMOGRAM WITH TOMOSYNTHESIS AND
CAD
TECHNIQUE: Right digital diagnostic mammography and breast tomosynthesis was
performed. The images were evaluated with computer-aided detection.

[R CC]
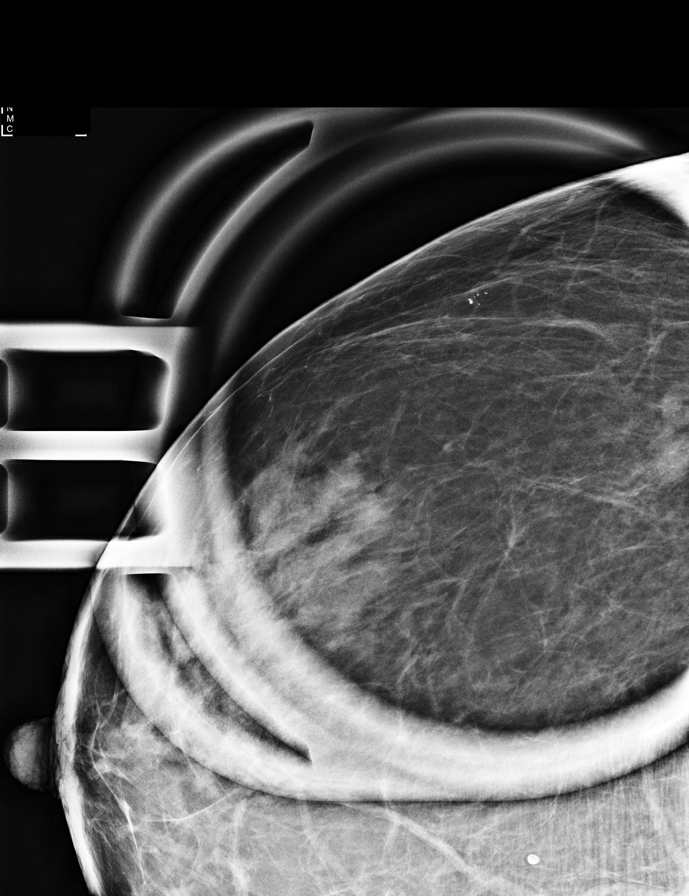

[R ML (1 of 2)]
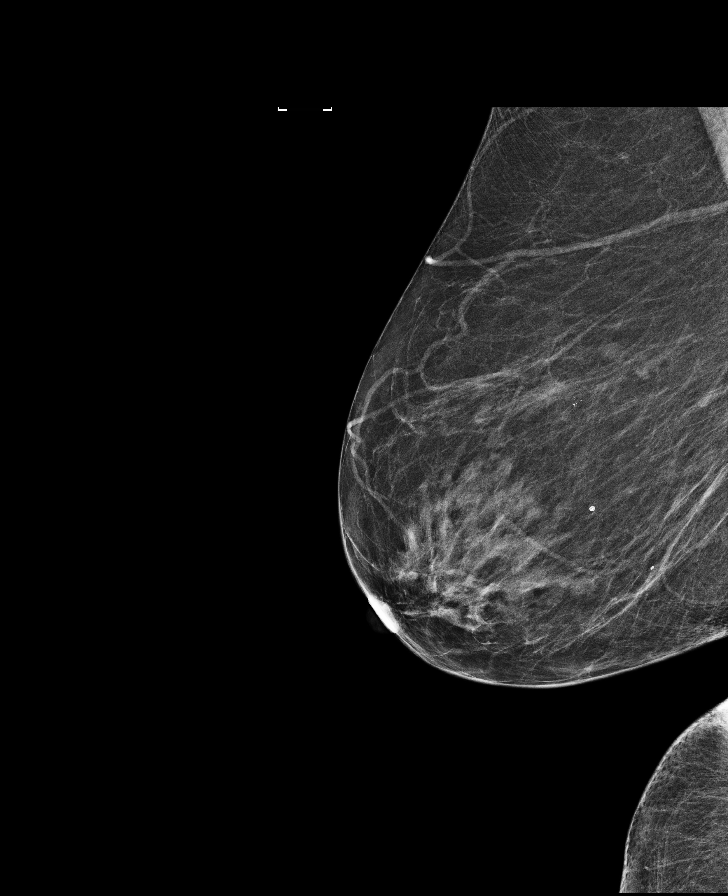

[R ML (2 of 2)]
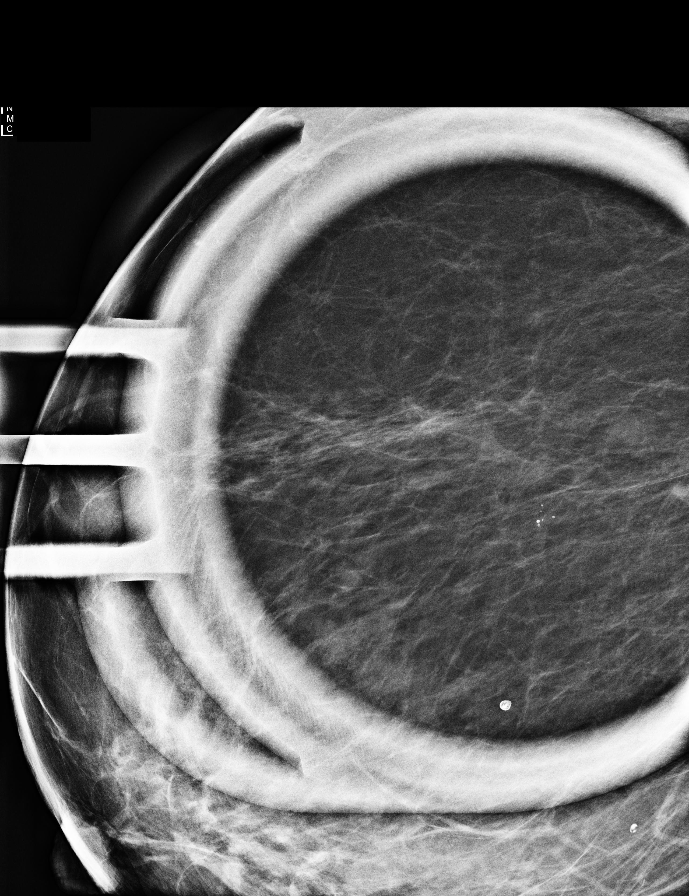

[R MLO synth-2D]
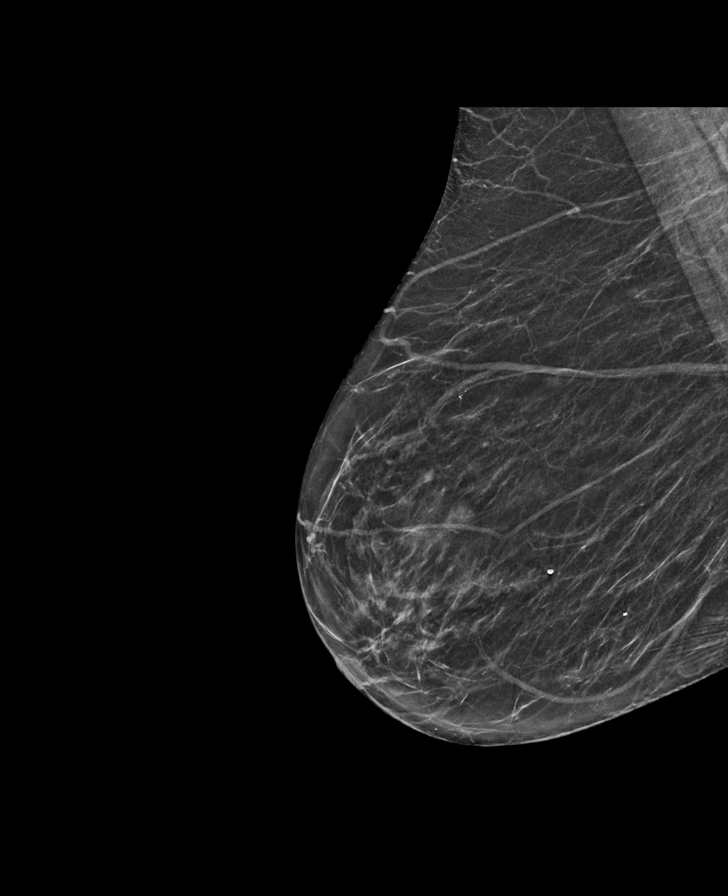

[R CC synth-2D]
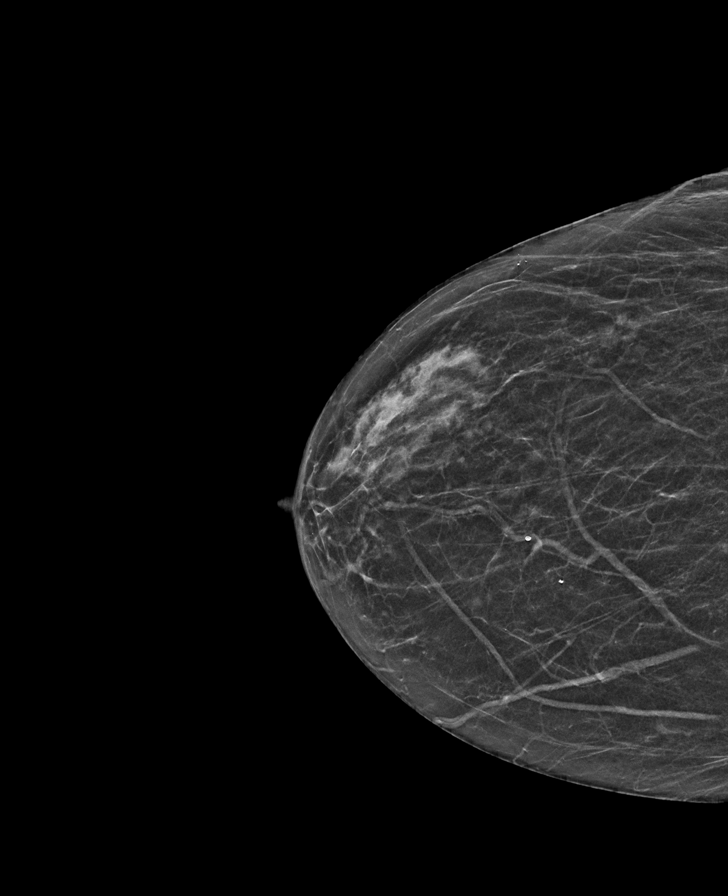

[R CC tomo · 2 of 45 frames shown]
[frame 15/45]
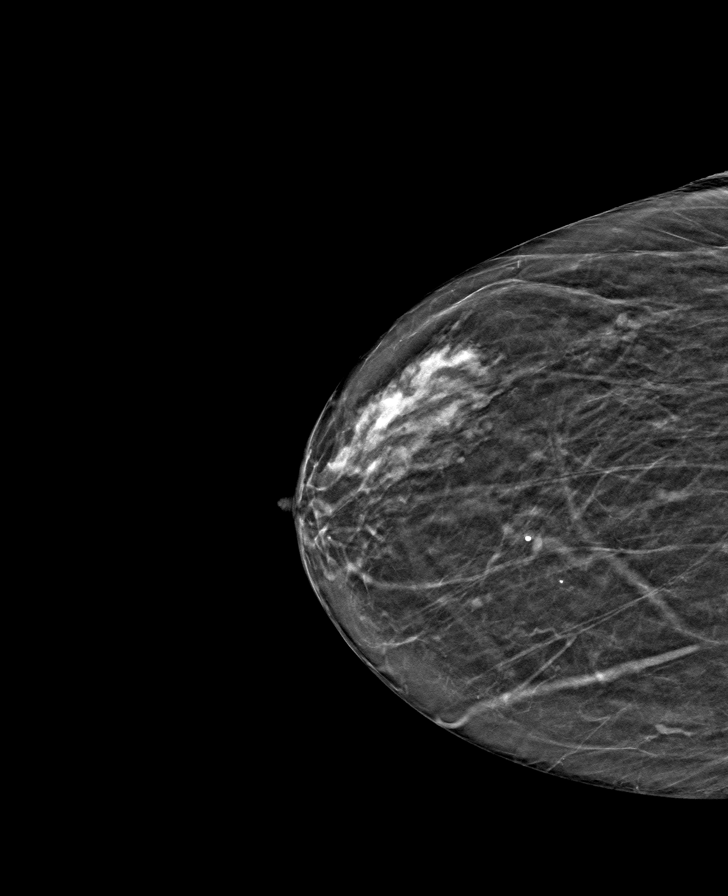
[frame 23/45]
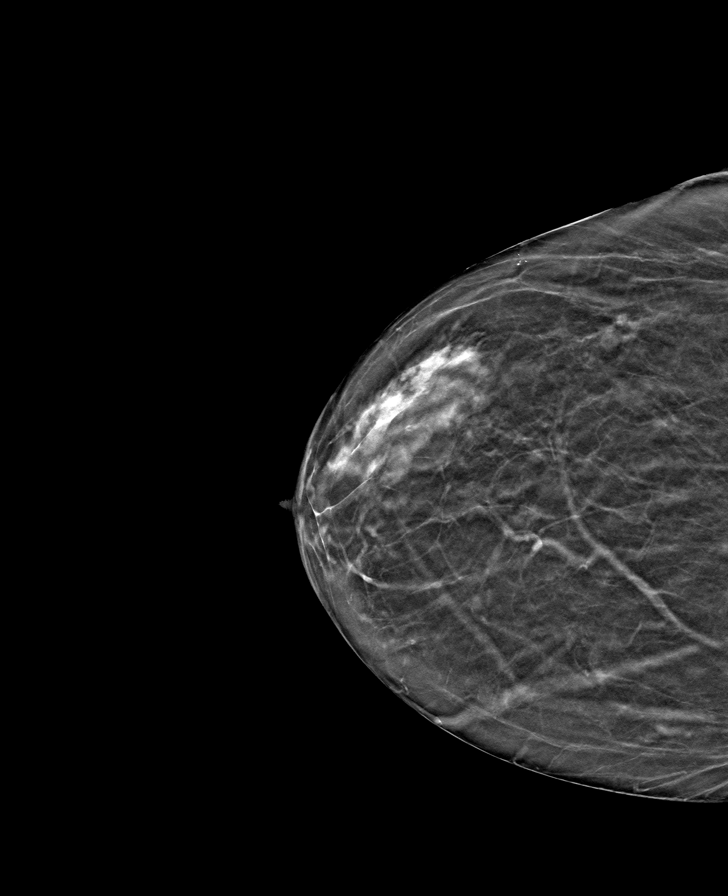

[R MLO tomo · tomo slice 26/51.0]
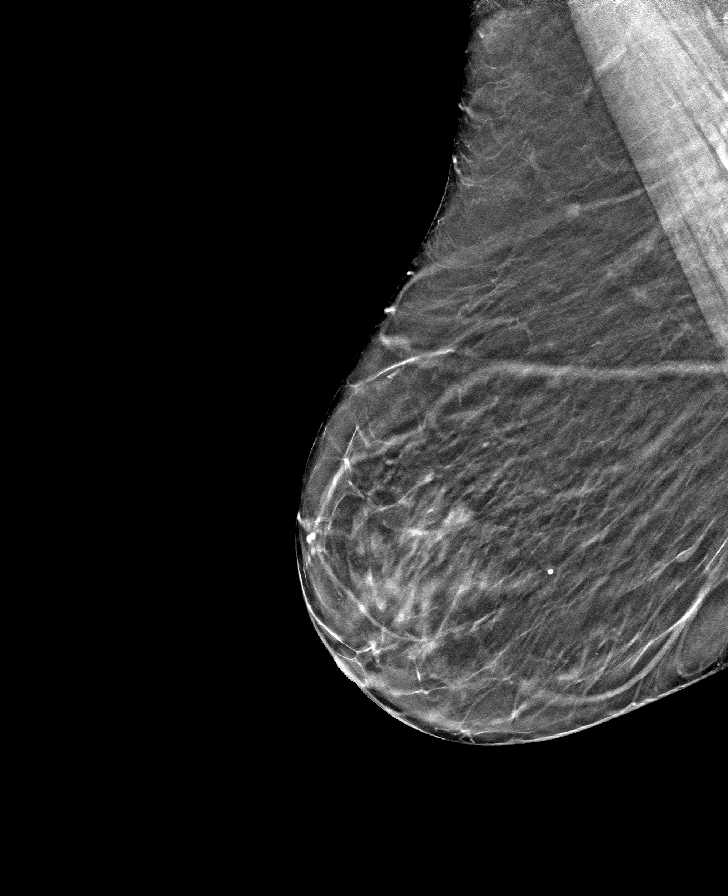

[8 of 15 positions shown; findings below may reference images not displayed]

ACR Breast Density Category b: There are scattered areas of
fibroglandular density.
FINDINGS: Magnified views are performed of calcifications in the UPPER-OUTER
QUADRANT of the RIGHT breast. On these views, a 3 millimeter group
of calcifications appears dystrophic, without suspicious morphology
or distribution.
IMPRESSION: Stable, probably benign RIGHT breast calcifications.

RECOMMENDATION:
Bilateral diagnostic mammogram in 6 months.

I have discussed the findings and recommendations with the patient.
If applicable, a reminder letter will be sent to the patient
regarding the next appointment.

BI-RADS CATEGORY  3: Probably benign.

## 2023-05-20 DIAGNOSIS — R490 Dysphonia: Secondary | ICD-10-CM | POA: Diagnosis not present

## 2023-05-20 DIAGNOSIS — R41841 Cognitive communication deficit: Secondary | ICD-10-CM | POA: Diagnosis not present

## 2023-05-21 DIAGNOSIS — R41841 Cognitive communication deficit: Secondary | ICD-10-CM | POA: Diagnosis not present

## 2023-05-21 DIAGNOSIS — R490 Dysphonia: Secondary | ICD-10-CM | POA: Diagnosis not present

## 2023-06-02 DIAGNOSIS — R41841 Cognitive communication deficit: Secondary | ICD-10-CM | POA: Diagnosis not present

## 2023-06-02 DIAGNOSIS — R490 Dysphonia: Secondary | ICD-10-CM | POA: Diagnosis not present

## 2023-06-17 DIAGNOSIS — R41841 Cognitive communication deficit: Secondary | ICD-10-CM | POA: Diagnosis not present

## 2023-06-17 DIAGNOSIS — R490 Dysphonia: Secondary | ICD-10-CM | POA: Diagnosis not present

## 2023-06-24 ENCOUNTER — Telehealth: Payer: Self-pay | Admitting: Physician Assistant

## 2023-06-24 NOTE — Telephone Encounter (Signed)
Pt would like a transfer of care from [facility] to Dr Jimmey Ralph. Recommended from another Dr. For geriatric care. Please advise.

## 2023-06-24 NOTE — Telephone Encounter (Signed)
Error

## 2023-06-24 NOTE — Telephone Encounter (Signed)
Please call daughter at [phone] - Misty Stanley

## 2023-06-29 NOTE — Telephone Encounter (Signed)
Ok with me.   [name]. Jimmey Ralph, MD 06/29/2023 7:30 AM

## 2023-06-29 NOTE — Telephone Encounter (Signed)
Pt has been scheduled for 7/10 @ 9am w/ Dr. Jimmey Ralph.

## 2023-06-29 NOTE — Telephone Encounter (Signed)
See PCP note.

## 2023-07-01 ENCOUNTER — Encounter: Payer: Self-pay | Admitting: Family Medicine

## 2023-07-01 ENCOUNTER — Ambulatory Visit (INDEPENDENT_AMBULATORY_CARE_PROVIDER_SITE_OTHER): Payer: Medicare HMO | Admitting: Family Medicine

## 2023-07-01 VITALS — BP 145/74 | HR 70 | Temp 98.0°F | Ht 62.0 in | Wt 180.2 lb

## 2023-07-01 DIAGNOSIS — R600 Localized edema: Secondary | ICD-10-CM | POA: Diagnosis not present

## 2023-07-01 DIAGNOSIS — E039 Hypothyroidism, unspecified: Secondary | ICD-10-CM | POA: Diagnosis not present

## 2023-07-01 DIAGNOSIS — G20A1 Parkinson's disease without dyskinesia, without mention of fluctuations: Secondary | ICD-10-CM

## 2023-07-01 DIAGNOSIS — G309 Alzheimer's disease, unspecified: Secondary | ICD-10-CM | POA: Diagnosis not present

## 2023-07-01 DIAGNOSIS — I10 Essential (primary) hypertension: Secondary | ICD-10-CM

## 2023-07-01 DIAGNOSIS — R490 Dysphonia: Secondary | ICD-10-CM | POA: Diagnosis not present

## 2023-07-01 DIAGNOSIS — E785 Hyperlipidemia, unspecified: Secondary | ICD-10-CM | POA: Insufficient documentation

## 2023-07-01 DIAGNOSIS — F0280 Dementia in other diseases classified elsewhere without behavioral disturbance: Secondary | ICD-10-CM

## 2023-07-01 DIAGNOSIS — R739 Hyperglycemia, unspecified: Secondary | ICD-10-CM

## 2023-07-01 LAB — CBC
HCT: 42 % (ref 36.0–46.0)
Hemoglobin: 13.8 g/dL (ref 12.0–15.0)
MCHC: 32.9 g/dL (ref 30.0–36.0)
MCV: 99.1 fl (ref 78.0–100.0)
Platelets: 287 10*3/uL (ref 150.0–400.0)
RBC: 4.23 Mil/uL (ref 3.87–5.11)
RDW: 14.4 % (ref 11.5–15.5)
WBC: 7.3 10*3/uL (ref 4.0–10.5)

## 2023-07-01 LAB — COMPREHENSIVE METABOLIC PANEL
ALT: 5 U/L (ref 0–35)
AST: 14 U/L (ref 0–37)
Albumin: 4 g/dL (ref 3.5–5.2)
Alkaline Phosphatase: 78 U/L (ref 39–117)
BUN: 17 mg/dL (ref 6–23)
CO2: 30 mEq/L (ref 19–32)
Calcium: 10 mg/dL (ref 8.4–10.5)
Chloride: 105 mEq/L (ref 96–112)
Creatinine, Ser: 0.85 mg/dL (ref 0.40–1.20)
GFR: 65.05 mL/min (ref >60.00)
Glucose, Bld: 103 mg/dL — ABNORMAL HIGH (ref 70–99)
Potassium: 4.9 mEq/L (ref 3.5–5.1)
Sodium: 143 mEq/L (ref 135–145)
Total Bilirubin: 0.7 mg/dL (ref 0.2–1.2)
Total Protein: 6.8 g/dL (ref 6.0–8.3)

## 2023-07-01 LAB — LIPID PANEL
Cholesterol: 213 mg/dL — ABNORMAL HIGH (ref 0–200)
HDL: 67.8 mg/dL (ref >39.00)
LDL Cholesterol: 109 mg/dL — ABNORMAL HIGH (ref 0–99)
NonHDL: 144.75
Total CHOL/HDL Ratio: 3
Triglycerides: 179 mg/dL — ABNORMAL HIGH (ref 0.0–149.0)
VLDL: 35.8 mg/dL (ref 0.0–40.0)

## 2023-07-01 LAB — TSH: TSH: 0.95 u[IU]/mL (ref 0.35–5.50)

## 2023-07-01 LAB — HEMOGLOBIN A1C: Hgb A1c MFr Bld: 5.9 % (ref 4.6–6.5)

## 2023-07-01 MED ORDER — LEVOTHYROXINE SODIUM 88 MCG PO TABS: 88.0000 ug | ORAL_TABLET | Freq: Every day | ORAL | Status: AC

## 2023-07-01 MED ORDER — DONEPEZIL HCL 10 MG PO TABS: 10.0000 mg | ORAL_TABLET | Freq: Every day | ORAL | 0 refills | Status: AC

## 2023-07-01 NOTE — Assessment & Plan Note (Signed)
COuld contributing to her recent weight gain though this does seem to be more diet mediated.  Will check her TSH today.  She is currently on Synthroid 88 mcg daily.

## 2023-07-01 NOTE — Assessment & Plan Note (Signed)
Minimal edema on exam today.  No red flag signs or symptoms.  Likely due to venous insufficiency and sedentary lifestyle.  We discussed conservative measures including salt avoidance, leg elevation, frequent ambulation, and compression stockings.  Will check labs today to rule out other potential causes.  They will let us know if this continues to be more of a persistent issue and would consider adding on diuretic at that point.

## 2023-07-01 NOTE — Assessment & Plan Note (Signed)
Following with neurology at [facility].  Currently on rivastigmine and donepezil.

## 2023-07-01 NOTE — Assessment & Plan Note (Signed)
Check lipids.  Not currently on any meds for this.

## 2023-07-01 NOTE — Patient Instructions (Signed)
It was very nice to see you today!  We will check blood work today.  Please cut down on the ice cream.  Please try to keep your mind active daily with conversations and brain teasers or games.   Please continue to stay active.  For your leg swelling please watch your sodium intake, keep your legs elevated, and stay active.  You can also use compression stockings.  Return in about 3 months (around 10/01/2023) for Follow Up.   Take care, Dr Jimmey Ralph  PLEASE NOTE:  If you had any lab tests, please let us know if you have not heard back within a few days. You may see your results on mychart before we have a chance to review them but we will give you a call once they are reviewed by [place].   If we ordered any referrals today, please let us know if you have not heard from their office within the next week.   If you had any urgent prescriptions sent in today, please check with the pharmacy within an hour of our visit to make sure the prescription was transmitted appropriately.   Please try these tips to maintain a healthy lifestyle:  Eat at least 3 REAL meals and 1-2 snacks per day.  Aim for no more than 5 hours between eating.  If you eat breakfast, please do so within one hour of getting up.   Each meal should contain half fruits/vegetables, one quarter protein, and one quarter carbs (no bigger than a computer mouse)  Cut down on sweet beverages. This includes juice, soda, and sweet tea.   Drink at least 1 glass of water with each meal and aim for at least 8 glasses per day  Exercise at least 150 minutes every week.

## 2023-07-01 NOTE — Assessment & Plan Note (Signed)
Due to Parkinson's.  She is working with speech therapy.

## 2023-07-01 NOTE — Progress Notes (Signed)
[name] is a 82 y.o. female who presents today for an office visit.  She is here to transfer care.   Assessment/Plan:  Chronic Problems Addressed Today: Leg edema Minimal edema on exam today.  No red flag signs or symptoms.  Likely due to venous insufficiency and sedentary lifestyle.  We discussed conservative measures including salt avoidance, leg elevation, frequent ambulation, and compression stockings.  Will check labs today to rule out other potential causes.  They will let us know if this continues to be more of a persistent issue and would consider adding on diuretic at that point.   Alzheimer disease [name]) Following with neurology at [name].  Currently on rivastigmine and donepezil.  Parkinson disease Following with neurology at [name].  On Sinemet.  Working with speech therapy and physical therapy.  Dysphonia Due to Parkinson's.  She is working with speech therapy.     Subjective:  HPI:  See A/P for status of chronic conditions.  Patient is here to transition care to [place] for primary care.  She is here with her husband today.  Her son also joined our visit via telephone.  She has been in [name] for many years.  She is currently [name].  This overall seems to be going well.  She is getting both speech therapy and physical therapy there.  She does have Alzheimer's disease and Parkinson's disease and is following with neurology for this.  She is currently on Sinemet, Aricept, and rivastigmine.  Overall symptoms have been stable but progressive.  She does need help with ADLs including transferring.  Her husband does help with some of his activities but she also gets assistance at her [facility] facility.  She is on levothyroxine 88 mcg daily for hypothyroidism.  Also on Protonix 40 mg daily for GERD.  We are not currently on any other medications.  Patient and husband both have difficult time with remembering what medications that she is on and rely on  her facility to administer their medications.  He is concerned about weight gain.  Since moving into the assisted [facility] facility she has gained about 30 to 40 pounds over the last year.  He attributes this mostly to diet.  She is eating 2 or more bowls of ice cream per day.  They are also concerned about swelling in her legs.  This been well for the last several weeks to months.  Seems to be getting better today.  No specific triggering events.  They have tried keeping her legs elevated.  No chest pain.  No shortness of breath.  No orthopnea.  ROS: Per HPI, otherwise a complete review of systems was negative.   PMH:  The following were reviewed and entered/updated in epic: Past Medical History:  Diagnosis Date   Allergy    Arthritis    Cancer (HCC)    kidney, seeing [name]   Grave's disease    Reflux    Patient Active Problem List   Diagnosis Date Noted   Parkinson disease 07/01/2023   Hypothyroidism 07/01/2023   Leg edema 07/01/2023   Dyslipidemia 07/01/2023   Alzheimer disease (HCC) 10/22/2021   Dysphonia 10/22/2021   Essential hypertension 05/02/2019   Past Surgical History:  Procedure Laterality Date   ABDOMINAL HYSTERECTOMY     COLONOSCOPY W/ BIOPSIES  02/27/2021   Repeat in 5 years   SALIVARY GLAND SURGERY     TRIGGER FINGER RELEASE  2018   TRIGGER FINGER RELEASE  2018  Family History  Problem Relation Age of Onset   Kidney disease Mother    Cancer - Lung Father    Mental illness Maternal Grandmother     Medications- reviewed and updated Current Outpatient Medications  Medication Sig Dispense Refill   rivastigmine (EXELON) 1.5 MG capsule Take 1.5 mg by mouth 2 (two) times daily.     Calcium 150 MG TABS 1 tab     carbidopa-levodopa (SINEMET IR) 25-100 MG tablet Take by mouth.     donepezil (ARICEPT) 10 MG tablet Take 1 tablet (10 mg total) by mouth daily. 180 tablet 0   levothyroxine (SYNTHROID) 88 MCG tablet Take 1 tablet (88 mcg total) by  mouth daily before breakfast.     Multiple Vitamin (MULTIVITAMIN WITH MINERALS) TABS tablet Take 1 tablet by mouth daily.     pantoprazole (PROTONIX) 40 MG tablet Take 1 tablet (40 mg total) by mouth daily. 90 tablet 0   Potassium Citrate 99 MG CAPS Take 1 capsule by mouth daily.     No current facility-administered medications for this visit.    Allergies-reviewed and updated Allergies  Allergen Reactions   Bactrim Other (See Comments)    Unsure childhood reaction.   Erythromycin Hives    Broke out in hives   Sulfa Antibiotics Other (See Comments)   Sulfamethoxazole-Trimethoprim Itching and Other (See Comments)    Unsure childhood reaction. Unsure childhood reaction.   Amoxicillin Rash   Nitrofurantoin Monohyd Macro Rash   Other Rash   Vicodin [Hydrocodone-Acetaminophen] Rash    Social History   Socioeconomic History   Marital status: Married    Spouse name: Vinny   Number of children: 2   Years of education: Dental assitant   Highest education level: Not on file  Occupational History   Not on file  Tobacco Use   Smoking status: Former    Packs/day: 0.25    Years: 20.00    Additional pack years: 0.00    Total pack years: 5.00    Types: Cigarettes    Quit date: 05/01/2004    Years since quitting: 19.1   Smokeless tobacco: Never  Vaping Use   Vaping Use: Never used  Substance and Sexual Activity   Alcohol use: Never   Drug use: Never   Sexual activity: Not Currently    Birth control/protection: Surgical  Other Topics Concern   Not on file  Social History Narrative   Lives with husband, Vinny   Caffeine use: Drinks coffee daily   Right handed   Social Determinants of Health   Financial Resource Strain: Low Risk  (08/01/2021)   Overall Financial Resource Strain (CARDIA)    Difficulty of Paying [facility] Expenses: Not hard at all  Food Insecurity: No Food Insecurity (08/01/2021)   Hunger Vital Sign    Worried About Running Out of Food in the Last Year: Never  true    Ran Out of Food in the Last Year: Never true  Transportation Needs: No Transportation Needs (08/01/2021)   PRAPARE - Administrator, Civil Service (Medical): No    Lack of Transportation (Non-Medical): No  Physical Activity: Insufficiently Active (08/01/2021)   Exercise Vital Sign    Days of Exercise per Week: 7 days    Minutes of Exercise per Session: 20 min  Stress: No Stress Concern Present (08/01/2021)   [employer] of Occupational Health - Occupational Stress Questionnaire    Feeling of Stress : Not at all  Social Connections: Moderately Integrated (08/01/2021)  Social Advertising account executive [NHANES]    Frequency of Communication with Friends and Family: More than three times a week    Frequency of Social Gatherings with Friends and Family: More than three times a week    Attends Religious Services: Never    Database administrator or Organizations: Yes    Attends Engineer, structural: 1 to 4 times per year    Marital Status: Married          Objective:  Physical Exam: BP (!) 145/74   Pulse 70   Temp 98 F (36.7 C) (Temporal)   Ht 5\' 2"  (1.575 m)   Wt 180 lb 3.2 oz (81.7 kg)   SpO2 97%   BMI 32.96 kg/m   Gen: No acute distress, resting comfortably CV: Regular rate and rhythm with no murmurs appreciated Pulm: Normal work of breathing, clear to auscultation bilaterally with no crackles, wheezes, or rhonchi MUSCULOSKELETAL: Trace pretibial edema bilaterally. Neuro: Grossly normal, moves all extremities Psych: Normal affect and thought content  Time Spent: 45 minutes of total time was spent on the date of the encounter performing the following actions: chart review prior to seeing the patient including recent visits with previous PCP and specialists, obtaining history, performing a medically necessary exam, counseling on the treatment plan, placing orders, and documenting in our EHR.        Katina Degree. Jimmey Ralph, MD 07/01/2023 9:53 AM

## 2023-07-01 NOTE — Assessment & Plan Note (Signed)
Mildly elevated today however at goal per JNC 8.  She was previously on lisinopril however this does not appear on current medication list.  Patient and husband are not sure what medication she is taking either.  They will continue to monitor at the facility and let us know if persistently elevated.  Will recheck again in 3 months.  Given her concurrent lower extremity may be reasonable to add on mild diuretic such as HCTZ if still elevated at next office visit.

## 2023-07-01 NOTE — Assessment & Plan Note (Signed)
Following with neurology at [facility].  On Sinemet.  Working with speech therapy and physical therapy.

## 2023-07-02 ENCOUNTER — Encounter: Payer: Self-pay | Admitting: Family Medicine

## 2023-07-02 DIAGNOSIS — R490 Dysphonia: Secondary | ICD-10-CM | POA: Diagnosis not present

## 2023-07-02 DIAGNOSIS — R7303 Prediabetes: Secondary | ICD-10-CM | POA: Insufficient documentation

## 2023-07-02 DIAGNOSIS — R41841 Cognitive communication deficit: Secondary | ICD-10-CM | POA: Diagnosis not present

## 2023-07-02 NOTE — Progress Notes (Signed)
Her blood sugar is borderline elevated.  Do not need to start meds but she should work on reducing sugar as we discussed at her office visit.  We can recheck this again in 3 months.  Her cholesterol is also little bit elevated.  She should work on diet and exercise as we discussed we can recheck again in a few months.  The rest of her labs are all stable including her thyroid, kidney function, liver function, electrolytes, and blood counts.

## 2023-07-15 DIAGNOSIS — R490 Dysphonia: Secondary | ICD-10-CM | POA: Diagnosis not present

## 2023-07-15 DIAGNOSIS — R41841 Cognitive communication deficit: Secondary | ICD-10-CM | POA: Diagnosis not present

## 2023-07-15 DIAGNOSIS — F02B0 Dementia in other diseases classified elsewhere, moderate, without behavioral disturbance, psychotic disturbance, mood disturbance, and anxiety: Secondary | ICD-10-CM | POA: Diagnosis not present

## 2023-07-15 DIAGNOSIS — G308 Other Alzheimer's disease: Secondary | ICD-10-CM | POA: Diagnosis not present

## 2023-07-16 DIAGNOSIS — R41841 Cognitive communication deficit: Secondary | ICD-10-CM | POA: Diagnosis not present

## 2023-07-16 DIAGNOSIS — R490 Dysphonia: Secondary | ICD-10-CM | POA: Diagnosis not present

## 2023-07-21 DIAGNOSIS — R490 Dysphonia: Secondary | ICD-10-CM | POA: Diagnosis not present

## 2023-07-21 DIAGNOSIS — R41841 Cognitive communication deficit: Secondary | ICD-10-CM | POA: Diagnosis not present

## 2023-07-22 DIAGNOSIS — R41841 Cognitive communication deficit: Secondary | ICD-10-CM | POA: Diagnosis not present

## 2023-07-22 DIAGNOSIS — R490 Dysphonia: Secondary | ICD-10-CM | POA: Diagnosis not present

## 2023-07-23 DIAGNOSIS — R41841 Cognitive communication deficit: Secondary | ICD-10-CM | POA: Diagnosis not present

## 2023-07-23 DIAGNOSIS — R490 Dysphonia: Secondary | ICD-10-CM | POA: Diagnosis not present

## 2023-08-04 DIAGNOSIS — R490 Dysphonia: Secondary | ICD-10-CM | POA: Diagnosis not present

## 2023-08-04 DIAGNOSIS — R41841 Cognitive communication deficit: Secondary | ICD-10-CM | POA: Diagnosis not present

## 2023-08-05 DIAGNOSIS — R490 Dysphonia: Secondary | ICD-10-CM | POA: Diagnosis not present

## 2023-08-05 DIAGNOSIS — R41841 Cognitive communication deficit: Secondary | ICD-10-CM | POA: Diagnosis not present

## 2023-08-06 DIAGNOSIS — R41841 Cognitive communication deficit: Secondary | ICD-10-CM | POA: Diagnosis not present

## 2023-08-06 DIAGNOSIS — R490 Dysphonia: Secondary | ICD-10-CM | POA: Diagnosis not present

## 2023-08-11 DIAGNOSIS — R41841 Cognitive communication deficit: Secondary | ICD-10-CM | POA: Diagnosis not present

## 2023-08-11 DIAGNOSIS — R490 Dysphonia: Secondary | ICD-10-CM | POA: Diagnosis not present

## 2023-08-12 DIAGNOSIS — F02B0 Dementia in other diseases classified elsewhere, moderate, without behavioral disturbance, psychotic disturbance, mood disturbance, and anxiety: Secondary | ICD-10-CM | POA: Diagnosis not present

## 2023-08-12 DIAGNOSIS — G308 Other Alzheimer's disease: Secondary | ICD-10-CM | POA: Diagnosis not present

## 2023-08-12 DIAGNOSIS — R41841 Cognitive communication deficit: Secondary | ICD-10-CM | POA: Diagnosis not present

## 2023-08-12 DIAGNOSIS — R490 Dysphonia: Secondary | ICD-10-CM | POA: Diagnosis not present

## 2023-08-18 DIAGNOSIS — R490 Dysphonia: Secondary | ICD-10-CM | POA: Diagnosis not present

## 2023-08-18 DIAGNOSIS — R41841 Cognitive communication deficit: Secondary | ICD-10-CM | POA: Diagnosis not present

## 2023-08-19 DIAGNOSIS — R490 Dysphonia: Secondary | ICD-10-CM | POA: Diagnosis not present

## 2023-08-19 DIAGNOSIS — R41841 Cognitive communication deficit: Secondary | ICD-10-CM | POA: Diagnosis not present

## 2023-08-20 DIAGNOSIS — R490 Dysphonia: Secondary | ICD-10-CM | POA: Diagnosis not present

## 2023-08-20 DIAGNOSIS — R41841 Cognitive communication deficit: Secondary | ICD-10-CM | POA: Diagnosis not present

## 2023-08-21 DIAGNOSIS — N2889 Other specified disorders of kidney and ureter: Secondary | ICD-10-CM | POA: Diagnosis not present

## 2023-08-25 DIAGNOSIS — R490 Dysphonia: Secondary | ICD-10-CM | POA: Diagnosis not present

## 2023-08-25 DIAGNOSIS — R41841 Cognitive communication deficit: Secondary | ICD-10-CM | POA: Diagnosis not present

## 2023-08-26 DIAGNOSIS — R41841 Cognitive communication deficit: Secondary | ICD-10-CM | POA: Diagnosis not present

## 2023-08-26 DIAGNOSIS — R490 Dysphonia: Secondary | ICD-10-CM | POA: Diagnosis not present

## 2023-08-27 DIAGNOSIS — R41841 Cognitive communication deficit: Secondary | ICD-10-CM | POA: Diagnosis not present

## 2023-08-27 DIAGNOSIS — R490 Dysphonia: Secondary | ICD-10-CM | POA: Diagnosis not present

## 2023-09-01 DIAGNOSIS — R490 Dysphonia: Secondary | ICD-10-CM | POA: Diagnosis not present

## 2023-09-01 DIAGNOSIS — R41841 Cognitive communication deficit: Secondary | ICD-10-CM | POA: Diagnosis not present

## 2023-09-02 DIAGNOSIS — R41841 Cognitive communication deficit: Secondary | ICD-10-CM | POA: Diagnosis not present

## 2023-09-02 DIAGNOSIS — R490 Dysphonia: Secondary | ICD-10-CM | POA: Diagnosis not present

## 2023-09-03 DIAGNOSIS — R490 Dysphonia: Secondary | ICD-10-CM | POA: Diagnosis not present

## 2023-09-03 DIAGNOSIS — R41841 Cognitive communication deficit: Secondary | ICD-10-CM | POA: Diagnosis not present

## 2023-09-08 DIAGNOSIS — R490 Dysphonia: Secondary | ICD-10-CM | POA: Diagnosis not present

## 2023-09-08 DIAGNOSIS — R41841 Cognitive communication deficit: Secondary | ICD-10-CM | POA: Diagnosis not present

## 2023-09-11 DIAGNOSIS — R490 Dysphonia: Secondary | ICD-10-CM | POA: Diagnosis not present

## 2023-09-11 DIAGNOSIS — R41841 Cognitive communication deficit: Secondary | ICD-10-CM | POA: Diagnosis not present

## 2023-09-14 DIAGNOSIS — G309 Alzheimer's disease, unspecified: Secondary | ICD-10-CM | POA: Diagnosis not present

## 2023-09-14 DIAGNOSIS — G20A1 Parkinson's disease without dyskinesia, without mention of fluctuations: Secondary | ICD-10-CM | POA: Diagnosis not present

## 2023-09-14 DIAGNOSIS — F0280 Dementia in other diseases classified elsewhere without behavioral disturbance: Secondary | ICD-10-CM | POA: Diagnosis not present

## 2023-09-15 DIAGNOSIS — R490 Dysphonia: Secondary | ICD-10-CM | POA: Diagnosis not present

## 2023-09-15 DIAGNOSIS — R41841 Cognitive communication deficit: Secondary | ICD-10-CM | POA: Diagnosis not present

## 2023-10-06 ENCOUNTER — Ambulatory Visit: Payer: Medicare HMO | Admitting: Physician Assistant

## 2023-11-03 DIAGNOSIS — R490 Dysphonia: Secondary | ICD-10-CM | POA: Diagnosis not present

## 2023-11-03 DIAGNOSIS — R41841 Cognitive communication deficit: Secondary | ICD-10-CM | POA: Diagnosis not present

## 2023-11-05 DIAGNOSIS — R41841 Cognitive communication deficit: Secondary | ICD-10-CM | POA: Diagnosis not present

## 2023-11-05 DIAGNOSIS — R490 Dysphonia: Secondary | ICD-10-CM | POA: Diagnosis not present

## 2023-11-06 ENCOUNTER — Ambulatory Visit: Payer: Medicare HMO | Admitting: Family Medicine

## 2023-11-09 ENCOUNTER — Encounter: Payer: Self-pay | Admitting: Family Medicine

## 2023-11-09 ENCOUNTER — Ambulatory Visit (INDEPENDENT_AMBULATORY_CARE_PROVIDER_SITE_OTHER): Payer: Medicare HMO | Admitting: Family Medicine

## 2023-11-09 VITALS — BP 133/77 | HR 69 | Temp 97.1°F | Ht 62.0 in | Wt 179.4 lb

## 2023-11-09 DIAGNOSIS — F0280 Dementia in other diseases classified elsewhere without behavioral disturbance: Secondary | ICD-10-CM | POA: Diagnosis not present

## 2023-11-09 DIAGNOSIS — E785 Hyperlipidemia, unspecified: Secondary | ICD-10-CM | POA: Diagnosis not present

## 2023-11-09 DIAGNOSIS — R7303 Prediabetes: Secondary | ICD-10-CM | POA: Diagnosis not present

## 2023-11-09 DIAGNOSIS — I10 Essential (primary) hypertension: Secondary | ICD-10-CM | POA: Diagnosis not present

## 2023-11-09 DIAGNOSIS — G20A1 Parkinson's disease without dyskinesia, without mention of fluctuations: Secondary | ICD-10-CM

## 2023-11-09 DIAGNOSIS — G309 Alzheimer's disease, unspecified: Secondary | ICD-10-CM | POA: Diagnosis not present

## 2023-11-09 NOTE — Assessment & Plan Note (Signed)
Recheck A1c next blood draw.  Discussed lifestyle modifications.

## 2023-11-09 NOTE — Patient Instructions (Signed)
It was very nice to see you today!  We will complete your paperwork today.  Will see back in 4 months.  Come back sooner if needed.  Return in about 4 months (around 03/08/2024).   Take care, Dr Jimmey Ralph  PLEASE NOTE:  If you had any lab tests, please let us know if you have not heard back within a few days. You may see your results on mychart before we have a chance to review them but we will give you a call once they are reviewed by [place].   If we ordered any referrals today, please let us know if you have not heard from their office within the next week.   If you had any urgent prescriptions sent in today, please check with the pharmacy within an hour of our visit to make sure the prescription was transmitted appropriately.   Please try these tips to maintain a healthy lifestyle:  Eat at least 3 REAL meals and 1-2 snacks per day.  Aim for no more than 5 hours between eating.  If you eat breakfast, please do so within one hour of getting up.   Each meal should contain half fruits/vegetables, one quarter protein, and one quarter carbs (no bigger than a computer mouse)  Cut down on sweet beverages. This includes juice, soda, and sweet tea.   Drink at least 1 glass of water with each meal and aim for at least 8 glasses per day  Exercise at least 150 minutes every week.

## 2023-11-09 NOTE — Assessment & Plan Note (Signed)
Follows with neurology at [facility].  No longer on rivastigmine.  She is still on donepezil.  Overall symptoms are stable.

## 2023-11-09 NOTE — Progress Notes (Signed)
   [name] is a 82 y.o. female who presents today for an office visit.  Assessment/Plan:  Chronic Problems Addressed Today: Alzheimer disease (HCC) Follows with neurology at [facility].  No longer on rivastigmine.  She is still on donepezil.  Overall symptoms are stable.    Parkinson disease Following with neurology at [facility].  On Sinemet and tolerating well.  Working with speech therapy and physical therapy.  Dyslipidemia Recheck lipids next blood draw.  Prediabetes Recheck A1c next blood draw.  Discussed lifestyle modifications.  Essential hypertension Blood pressure at goal today without lisinopril.  They will continue to monitor at home and let us know if persistently elevated.    Subjective:  HPI:  See A/P for status of chronic conditions.  Patient is here today for follow-up.  We last saw her 3 months ago.  At that time we had checked her labs which did show borderline elevated cholesterol and blood sugar.  Since our last visit, her lisinopril and rivastigmine was stopped. She has done well.        Objective:  Physical Exam: BP 133/77   Pulse 69   Temp (!) 97.1 F (36.2 C) (Temporal)   Ht 5\' 2"  (1.575 m)   Wt 179 lb 6.4 oz (81.4 kg)   SpO2 96%   BMI 32.81 kg/m   Wt Readings from Last 3 Encounters:  11/09/23 179 lb 6.4 oz (81.4 kg)  07/01/23 180 lb 3.2 oz (81.7 kg)  04/02/23 145 lb 8.1 oz (66 kg)    Gen: No acute distress, resting comfortably CV: Regular rate and rhythm with 2/6 systolic murmur appreciated Pulm: Normal work of breathing, clear to auscultation bilaterally with no crackles, wheezes, or rhonchi Neuro: Grossly normal, moves all extremities Psych: Normal affect and thought content      Aryn Kops M. Jimmey Ralph, MD 11/09/2023 2:32 PM

## 2023-11-09 NOTE — Assessment & Plan Note (Signed)
Recheck lipids next blood draw. 

## 2023-11-09 NOTE — Assessment & Plan Note (Signed)
Blood pressure at goal today without lisinopril.  They will continue to monitor at home and let us know if persistently elevated.

## 2023-11-09 NOTE — Assessment & Plan Note (Signed)
Following with neurology at [facility].  On Sinemet and tolerating well.  Working with speech therapy and physical therapy.

## 2024-01-13 ENCOUNTER — Telehealth: Payer: Self-pay | Admitting: Family Medicine

## 2024-01-13 ENCOUNTER — Telehealth: Payer: Self-pay | Admitting: *Deleted

## 2024-01-13 NOTE — Telephone Encounter (Signed)
Received faxed document  assisted living form , to be filled out by provider. Patient requested to send it back via Fax within ASAP. Document is located in providers tray at front office.Please advise

## 2024-01-13 NOTE — Telephone Encounter (Signed)
Copied from CRM [id number]. Topic: Clinical - Home Health Verbal Orders >> Jan 13, 2024 11:35 AM Adele Barthel wrote: Caller/Agency: [facility] Number: Allena Katz, # [phone] Service Requested: Patient and her husband are moving into an assisted living facility and need forms to be completed before they can move in. Representative reports he will fax forms over today.  Frequency: N/a Any new concerns about the patient? No  Noted, no form received today

## 2024-01-14 NOTE — Telephone Encounter (Signed)
Patient need OV for form completion  Please schedule OV soon

## 2024-01-15 NOTE — Telephone Encounter (Unsigned)
Copied from CRM [id number]. Topic: General - Other >> Jan 15, 2024  9:57 AM Fredrich Romans wrote: Reason for CRM: patient called in to ask about forms that are being completed by pcp for their move to assisted living facility.He wanted to know if provider received forms.

## 2024-01-15 NOTE — Telephone Encounter (Signed)
Patient has an OV on 01/19/2024, to complete form

## 2024-01-19 ENCOUNTER — Telehealth: Payer: Self-pay | Admitting: Family Medicine

## 2024-01-19 ENCOUNTER — Ambulatory Visit (INDEPENDENT_AMBULATORY_CARE_PROVIDER_SITE_OTHER): Payer: Medicare Other | Admitting: Family Medicine

## 2024-01-19 ENCOUNTER — Encounter: Payer: Self-pay | Admitting: Family Medicine

## 2024-01-19 VITALS — BP 134/81 | HR 64 | Temp 97.2°F | Ht 62.0 in | Wt 180.4 lb

## 2024-01-19 DIAGNOSIS — R739 Hyperglycemia, unspecified: Secondary | ICD-10-CM

## 2024-01-19 DIAGNOSIS — G20A1 Parkinson's disease without dyskinesia, without mention of fluctuations: Secondary | ICD-10-CM | POA: Diagnosis not present

## 2024-01-19 DIAGNOSIS — R490 Dysphonia: Secondary | ICD-10-CM | POA: Diagnosis not present

## 2024-01-19 DIAGNOSIS — E039 Hypothyroidism, unspecified: Secondary | ICD-10-CM

## 2024-01-19 DIAGNOSIS — E785 Hyperlipidemia, unspecified: Secondary | ICD-10-CM

## 2024-01-19 DIAGNOSIS — Z0279 Encounter for issue of other medical certificate: Secondary | ICD-10-CM

## 2024-01-19 DIAGNOSIS — R7303 Prediabetes: Secondary | ICD-10-CM

## 2024-01-19 DIAGNOSIS — Z111 Encounter for screening for respiratory tuberculosis: Secondary | ICD-10-CM

## 2024-01-19 DIAGNOSIS — F0280 Dementia in other diseases classified elsewhere without behavioral disturbance: Secondary | ICD-10-CM

## 2024-01-19 DIAGNOSIS — I10 Essential (primary) hypertension: Secondary | ICD-10-CM

## 2024-01-19 DIAGNOSIS — G309 Alzheimer's disease, unspecified: Secondary | ICD-10-CM

## 2024-01-19 NOTE — Assessment & Plan Note (Signed)
Blood pressure at goal today without meds.

## 2024-01-19 NOTE — Patient Instructions (Addendum)
It was very nice to see you today!  We will check for tuberculosis.  I will fill out your form.  Please let us know if we can be of any other assistance.  Return if symptoms worsen or fail to improve.   Take care, Dr Jimmey Ralph  PLEASE NOTE:  If you had any lab tests, please let us know if you have not heard back within a few days. You may see your results on mychart before we have a chance to review them but we will give you a call once they are reviewed by [place].   If we ordered any referrals today, please let us know if you have not heard from their office within the next week.   If you had any urgent prescriptions sent in today, please check with the pharmacy within an hour of our visit to make sure the prescription was transmitted appropriately.   Please try these tips to maintain a healthy lifestyle:  Eat at least 3 REAL meals and 1-2 snacks per day.  Aim for no more than 5 hours between eating.  If you eat breakfast, please do so within one hour of getting up.   Each meal should contain half fruits/vegetables, one quarter protein, and one quarter carbs (no bigger than a computer mouse)  Cut down on sweet beverages. This includes juice, soda, and sweet tea.   Drink at least 1 glass of water with each meal and aim for at least 8 glasses per day  Exercise at least 150 minutes every week.

## 2024-01-19 NOTE — Telephone Encounter (Unsigned)
Copied from CRM [id number]. Topic: General - Phone/Fax/Address >> Jan 19, 2024  4:04 PM Isabell A wrote: Patient/patient representative is calling for clinic's phone, fax, or address information.  Allena Katz from [facility] living community re-faxing health assessment form for the patient.   Reuel Boom is requesting a call back once received. Callback number: [phone]

## 2024-01-19 NOTE — Assessment & Plan Note (Signed)
Continue management per neurology at [facility].  She is on Sinemet.  Tolerating well.

## 2024-01-19 NOTE — Assessment & Plan Note (Signed)
Form was completed today.  She is no longer taking Aricept.  Overall her cognitive function is stable.

## 2024-01-19 NOTE — Assessment & Plan Note (Signed)
Due to Parkinson's.  She is no longer working with speech therapy.

## 2024-01-19 NOTE — Assessment & Plan Note (Signed)
Stable on Synthroid 88 mcg daily.

## 2024-01-19 NOTE — Progress Notes (Signed)
   [name] is a 83 y.o. female who presents today for an office visit.  Assessment/Plan:  New/Acute Problems: Encounter for form completion Form completed today. She will be moving to ALF in [place]. Will check TB screening.  Chronic Problems Addressed Today: Essential hypertension Blood pressure at goal today without meds.  Alzheimer disease (HCC) Form was completed today.  She is no longer taking Aricept.  Overall her cognitive function is stable.  Dysphonia Due to Parkinson's.  She is no longer working with speech therapy.  Parkinson disease Continue management per neurology at [facility].  She is on Sinemet.  Tolerating well.  Hypothyroidism Stable on Synthroid 88 mcg daily.     Subjective:  HPI:  See Assessment / plan for status of chronic conditions.  Patient here today for form completion. They will be moving to [place] to be closer to family.  She is currently living in ALF with husband.  They will be residing in an ALF after moving.  May need to have blood work performed today as well.  Overall she is doing well today.  No acute concerns.       Objective:  Physical Exam: BP 134/81   Pulse 64   Temp (!) 97.2 F (36.2 C) (Temporal)   Ht 5\' 2"  (1.575 m)   Wt 180 lb 6.4 oz (81.8 kg)   SpO2 99%   BMI 33.00 kg/m   Gen: No acute distress, resting comfortably CV: Regular rate and rhythm with no murmurs appreciated Pulm: Normal work of breathing, clear to auscultation bilaterally with no crackles, wheezes, or rhonchi Neuro: Grossly normal, moves all extremities Psych: Normal affect.  Minimally verbal.      Katina Degree. Jimmey Ralph, MD 01/19/2024 10:18 AM

## 2024-01-20 NOTE — Telephone Encounter (Signed)
Form in placed in PCP office to be reviewed and sign

## 2024-01-21 NOTE — Telephone Encounter (Signed)
Form faxed to [phone] Waiting for TB gold results  Need to faxed results to above fax number

## 2024-01-22 ENCOUNTER — Encounter: Payer: Self-pay | Admitting: Family Medicine

## 2024-01-22 LAB — QUANTIFERON-TB GOLD PLUS
Mitogen-NIL: >10 [IU]/mL
NIL: 0.04 [IU]/mL
QuantiFERON-TB Gold Plus: NEGATIVE
TB1-NIL: 0 [IU]/mL
TB2-NIL: 0.01 [IU]/mL

## 2024-01-22 NOTE — Telephone Encounter (Unsigned)
Copied from CRM [id number]. Topic: General - Other >> Jan 22, 2024  4:03 PM Turkey A wrote: Reason for CRM: Patients daughter in law Misty Stanley) called to request patient's TB information be faxed to [phone]. She ssaid that it is in Mychart but the Assisted Living needs a faxed copy. I called [facility] and was informed there have been two messages sent and that the doctor will see the messages on Monday and will work and fast as he can to answer messages. Misty Stanley was not happy with that and requested a Production designer, theatre/television/film. Agent called [employer]

## 2024-01-22 NOTE — Telephone Encounter (Signed)
Pt called back and states she needs the forms to be done ASAP, she's going to the facility next week.    Copied from CRM [id number]. Topic: General - Other >> Jan 22, 2024  2:16 PM Theodis Sato wrote: Reason for CRM: Patients husband is requesting Dr. Jimmey Ralph to please fax/send over the patients negative TB test to their new assisted living facility today as they cannot move in until this is sent over.

## 2024-01-22 NOTE — Telephone Encounter (Signed)
Results requested faxed to number in previous note. Nothing further needed at this time

## 2024-01-22 NOTE — Telephone Encounter (Unsigned)
Copied from CRM [id number]. Topic: General - Other >> Jan 22, 2024  3:51 PM Dennison Nancy wrote: Reason for CRM: Tramya Schoenfelder called on status of caleb Jimmey Ralph MD getting  forms  sent over soon as possible for assistance living need by today 01/22/24 to be able to move to assisted living facility   per Chaney Malling stated the form is already completed  need the fax the form showing the Tb test is negative . While on phone with Demmi Sindt lost connection due to system issues.

## 2024-01-22 NOTE — Progress Notes (Signed)
TB test is negative.  Okay to fill out forms for assisted living facility.

## 2024-01-25 NOTE — Telephone Encounter (Signed)
TB results faxed on 01/21/2023 by [facility] Given to Tammy to email to provider email

## 2024-01-26 NOTE — Telephone Encounter (Signed)
Copy placed to be scan in patient chart

## 2024-02-02 ENCOUNTER — Telehealth: Payer: Self-pay

## 2024-02-02 NOTE — Telephone Encounter (Signed)
Copied from CRM [id number]. Topic: Clinical - Prescription Issue >> Feb 02, 2024 10:22 AM Hector Shade B wrote: Reason for CRM: Received a phone call from pharmacy in regards to a medication prescribed by Dr. Jimmey Ralph. Pharmacist stated that she cannot provide a 150 mg dosage of the calcium, but can move it up to 200 mg if need be. Please call at [phone]

## 2024-02-02 NOTE — Telephone Encounter (Signed)
Ok with me.  [name]. Jimmey Ralph, MD 02/02/2024 12:08 PM

## 2024-02-03 ENCOUNTER — Other Ambulatory Visit: Payer: Self-pay | Admitting: *Deleted

## 2024-02-03 NOTE — Telephone Encounter (Signed)
Called at [phone] provide changes on Rx Calcium 150 mg dosage to 200 mg  Per PCP ok to change

## 2024-03-02 ENCOUNTER — Telehealth: Payer: Self-pay

## 2024-03-02 NOTE — Telephone Encounter (Signed)
 [facility] Orders Order Number: [id number] Faxed Confirmation on 03/02/24, papers left on Stella's desk

## 2024-03-08 ENCOUNTER — Ambulatory Visit: Payer: Medicare HMO | Admitting: Family Medicine

## 2024-07-01 ENCOUNTER — Other Ambulatory Visit: Payer: Self-pay | Admitting: Nurse Practitioner

## 2024-07-01 DIAGNOSIS — N2889 Other specified disorders of kidney and ureter: Secondary | ICD-10-CM
